# Patient Record
Sex: Female | Born: 1940 | Race: White | Hispanic: No | Smoking: Former smoker
Health system: Southern US, Community
[De-identification: ages and names within clinical notes are randomized; demographics above are authoritative.]

## PROBLEM LIST (undated history)

## (undated) DIAGNOSIS — I639 Cerebral infarction, unspecified: Secondary | ICD-10-CM

## (undated) DIAGNOSIS — B0229 Other postherpetic nervous system involvement: Secondary | ICD-10-CM

## (undated) DIAGNOSIS — M3500 Sicca syndrome, unspecified: Secondary | ICD-10-CM

## (undated) DIAGNOSIS — I8390 Asymptomatic varicose veins of unspecified lower extremity: Secondary | ICD-10-CM

## (undated) DIAGNOSIS — M1990 Unspecified osteoarthritis, unspecified site: Secondary | ICD-10-CM

## (undated) DIAGNOSIS — D649 Anemia, unspecified: Secondary | ICD-10-CM

## (undated) DIAGNOSIS — I10 Essential (primary) hypertension: Secondary | ICD-10-CM

## (undated) DIAGNOSIS — E785 Hyperlipidemia, unspecified: Secondary | ICD-10-CM

## (undated) DIAGNOSIS — I6529 Occlusion and stenosis of unspecified carotid artery: Secondary | ICD-10-CM

## (undated) DIAGNOSIS — F419 Anxiety disorder, unspecified: Secondary | ICD-10-CM

## (undated) HISTORY — DX: Sjogren syndrome, unspecified: M35.00

## (undated) HISTORY — DX: Hyperlipidemia, unspecified: E78.5

## (undated) HISTORY — PX: DILATION AND CURETTAGE OF UTERUS: SHX78

## (undated) HISTORY — DX: Occlusion and stenosis of unspecified carotid artery: I65.29

## (undated) HISTORY — DX: Cerebral infarction, unspecified: I63.9

## (undated) HISTORY — DX: Unspecified osteoarthritis, unspecified site: M19.90

## (undated) HISTORY — PX: EYE SURGERY: SHX253

## (undated) HISTORY — DX: Other postherpetic nervous system involvement: B02.29

## (undated) HISTORY — DX: Asymptomatic varicose veins of unspecified lower extremity: I83.90

## (undated) HISTORY — DX: Essential (primary) hypertension: I10

---

## 1966-02-23 HISTORY — PX: ADENOIDECTOMY: SUR15

## 1966-02-23 HISTORY — PX: TONSILLECTOMY: SUR1361

## 2006-02-23 DIAGNOSIS — I639 Cerebral infarction, unspecified: Secondary | ICD-10-CM

## 2006-02-23 HISTORY — DX: Cerebral infarction, unspecified: I63.9

## 2012-06-30 ENCOUNTER — Other Ambulatory Visit: Payer: Self-pay | Admitting: Family Medicine

## 2012-06-30 DIAGNOSIS — E785 Hyperlipidemia, unspecified: Secondary | ICD-10-CM

## 2012-06-30 DIAGNOSIS — R51 Headache: Secondary | ICD-10-CM

## 2012-06-30 DIAGNOSIS — Z8673 Personal history of transient ischemic attack (TIA), and cerebral infarction without residual deficits: Secondary | ICD-10-CM

## 2012-07-12 ENCOUNTER — Ambulatory Visit
Admission: RE | Admit: 2012-07-12 | Discharge: 2012-07-12 | Disposition: A | Discharge status: Home / Self Care | Payer: Medicare Other | Source: Ambulatory Visit | Attending: Family Medicine | Admitting: Family Medicine

## 2012-07-12 DIAGNOSIS — Z8673 Personal history of transient ischemic attack (TIA), and cerebral infarction without residual deficits: Secondary | ICD-10-CM

## 2012-07-12 DIAGNOSIS — R51 Headache: Secondary | ICD-10-CM

## 2012-07-12 DIAGNOSIS — E785 Hyperlipidemia, unspecified: Secondary | ICD-10-CM

## 2012-07-14 ENCOUNTER — Encounter: Payer: Self-pay | Admitting: Vascular Surgery

## 2012-08-10 ENCOUNTER — Encounter: Payer: Self-pay | Admitting: Vascular Surgery

## 2012-08-11 ENCOUNTER — Encounter: Payer: Self-pay | Admitting: Vascular Surgery

## 2012-08-11 ENCOUNTER — Ambulatory Visit (INDEPENDENT_AMBULATORY_CARE_PROVIDER_SITE_OTHER): Payer: Medicare Other | Admitting: Vascular Surgery

## 2012-08-11 DIAGNOSIS — I6529 Occlusion and stenosis of unspecified carotid artery: Secondary | ICD-10-CM

## 2012-08-11 NOTE — Progress Notes (Signed)
[facility] [place] HISTORY AND PHYSICAL   History of Present Illness:  Patient is a 72 y.o. 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  The patient previously had a stroke in 2008. This was in [facility]. Records are not available for review today. 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 She has recovered function in her right upper extremity except some numbness and tingling. She still has right eye visual loss. She also still has some weakness in her leg. She was told in the past that she could not have aspirin or Plavix due to her previous hemorrhagic stroke. She recently had a carotid duplex exam for surveillance. 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 I reviewed these images today. The area of suspected ulceration was 2 x 1.3 mm. There is less than 50% stenosis of the internal carotid arteries. Other medical problems include arthritis, hyperlipidemia, hypertension ulcers are currently controlled. The patient recently moved here from [place].  Past Medical History  Diagnosis Date  . Arthritis     osteoarthritis  . Hyperlipidemia   . Sjogren's syndrome   . Stroke     2008  . Hypertension     Past Surgical History  Procedure Laterality Date  . Dilation and curettage of uterus       Social History History  Substance Use Topics  . Smoking status: Former Smoker    Types: Cigarettes    Quit date: 08/11/2005  . Smokeless tobacco: Never Used  . Alcohol Use: 1 - 1.5 oz/week    2-3 drink(s) per week    Family History Family History  Problem Relation Age of Onset  . Other Mother     varicose veins  . Cancer Father   . Hypertension Father     Allergies  Allergies  Allergen Reactions  . Sulfa Antibiotics      Current Outpatient Prescriptions  Medication Sig  Dispense Refill  . amitriptyline (ELAVIL) 10 MG tablet Take 10 mg by mouth at bedtime.      . ciprofloxacin (CILOXAN) 0.3 % ophthalmic ointment 3 (three) times daily.      . cyclobenzaprine (FLEXERIL) 10 MG tablet Take 10 mg by mouth daily as needed for muscle spasms.      . diclofenac sodium (VOLTAREN) 1 % GEL Apply 2 g topically 4 (four) times daily.      Marland Kitchen ezetimibe (ZETIA) 10 MG tablet Take 10 mg by mouth daily.      . hydrochlorothiazide (MICROZIDE) 12.5 MG capsule Take 12.5 mg by mouth every morning.      . lidocaine (LIDODERM) 5 % Place 1 patch onto the skin every 12 (twelve) hours as needed. Remove & Discard patch within 12 hours or as directed by MD      . Oxycodone HCl 10 MG TABS Take 10 mg by mouth every 8 (eight) hours as needed.      . pregabalin (LYRICA) 75 MG capsule Take 75 mg by mouth 2 (two) times daily.      . ramipril (ALTACE) 5 MG tablet Take 5 mg by mouth daily. Take one po in the morning and one in the evening.      . rosuvastatin (CRESTOR) 40 MG tablet Take 40 mg by mouth daily.      . sertraline (ZOLOFT) 50 MG  tablet Take 50 mg by mouth daily.      . ciprofloxacin (CIPRO) 500 MG tablet Take 500 mg by mouth 2 (two) times daily.       No current facility-administered medications for this visit.    ROS:   General:  No weight loss, Fever, chills  HEENT: No recent headaches, no nasal bleeding, no visual changes, no sore throat  Neurologic: No dizziness, blackouts, seizures. No recent symptoms of stroke or mini- stroke. No recent episodes of slurred speech, or temporary blindness.  Cardiac: No recent episodes of chest pain/pressure, no shortness of breath at rest.  No shortness of breath with exertion.  Denies history of atrial fibrillation or irregular heartbeat  Vascular: No history of rest pain in feet.  No history of claudication.  No history of non-healing ulcer, No history of DVT   Pulmonary: No home oxygen, no productive cough, no hemoptysis,  No asthma or  wheezing  Musculoskeletal:  [x ] Arthritis, [ ]  Low back pain,  [ ]  Joint pain  Hematologic:No history of hypercoagulable state.  No history of easy bleeding.  No history of anemia  Gastrointestinal: No hematochezia or melena,  No gastroesophageal reflux, no trouble swallowing  Urinary: [ ]  chronic Kidney disease, [ ]  on HD - [ ]  MWF or [ ]  TTHS, [ ]  Burning with urination, [ ]  Frequent urination, [ ]  Difficulty urinating;   Skin: No rashes  Psychological: No history of anxiety,  No history of depression   Physical Examination  Filed Vitals:   08/11/12 1108 08/11/12 1113  BP: 135/80 119/75  Pulse: 77   Weight: 195 lb (88.451 kg)   SpO2: 96%     There is no height on file to calculate BMI.  General:  Alert and oriented, no acute distress HEENT: Normal Neck: No bruit or JVD Pulmonary: Clear to auscultation bilaterally Cardiac: Regular Rate and Rhythm without murmur Abdomen: Soft, non-tender, non-distended, no mass Skin: No rash Extremity Pulses:  2+ radial, brachial, femoral, dorsalis pedis, posterior tibial pulses bilaterally Musculoskeletal: No deformity or edema  Neurologic: Upper and lower extremity motor 5/5 and symmetric  DATA: Carotid duplex from [facility] imaging reviewed as resulted above   ASSESSMENT: Patient thought have a small ulceration left internal carotid artery however duplex ultrasound is not the best imaging modality to determine this. The ulceration at present is quite small less than 2 mm. The patient is asymptomatic.   PLAN:  I believe the best option would be carotid surveillance every 6 months. She'll return for carotid duplex exam in 6 months time. I think she would also benefit from either aspirin or Plavix for antiplatelet. However the patient had been told in the past due to her hemorrhagic infarct she did not take these medications. I did note however that she is on Votaren which has similar effects with regard to bleeding. All obtain her  records from [facility] and also try to get in touch with Dr. Vickey Huger from neurology regarding whether or not antiplatelet would be useful in this patient.  Fabienne Bruns, MD [facility] [place] Office: [phone] Pager: [phone]

## 2012-08-12 ENCOUNTER — Telehealth: Payer: Self-pay | Admitting: Vascular Surgery

## 2012-08-12 ENCOUNTER — Encounter: Payer: Self-pay | Admitting: Neurology

## 2012-08-12 NOTE — Telephone Encounter (Signed)
Spoke with Dr Vickey Huger who  Agrees with starting ASA.   Spoke with pt today.  She will start 81 mg ASA once daily.  Follow up with carotid duplex 6 months  Fabienne Bruns, MD [facility] [place] Office: [phone] Pager: [phone]

## 2012-09-14 ENCOUNTER — Encounter: Payer: Self-pay | Admitting: Podiatry

## 2012-10-21 ENCOUNTER — Telehealth: Payer: Self-pay

## 2012-10-21 NOTE — Telephone Encounter (Signed)
Pt. called to report bruising of her wrist recently after wearing a bracelet-style watch.  States the watch was a tight-fitting one.  Describes the bruising as "a semi-circle of tiny little bruises".  Pt. taking ASA 81 mg.  Denies any widespread bruising, or any unexplained bruising on body.  States the area on wrist has improved, and she will avoid wearing the watch in the future.  Wanted to make Dr. Darrick Penna aware, and questioned if he would want her to continue the ASA 81 mg daily vs decreasing to every other day.  Advised will discuss w/ Dr. Darrick Penna, and return call to her.  Agrees.

## 2012-10-25 NOTE — Telephone Encounter (Signed)
Rec'd recommendation per Dr. Darrick Penna to have pt. take ASA 81 mg every day.  Phone call to pt.  Left voice message with Dr. Darrick Penna recommendation to continue ASA daily.

## 2012-10-26 NOTE — Telephone Encounter (Signed)
Rec'd return phone call from pt. At 4:25 PM 10/25/12.  Stated she got the voice message re: continuing to take ASA 81 mg daily; stated she will continue it as prescribed.

## 2012-12-03 ENCOUNTER — Other Ambulatory Visit: Payer: Self-pay | Admitting: Neurology

## 2013-02-13 ENCOUNTER — Ambulatory Visit: Payer: Self-pay | Admitting: Neurology

## 2013-02-13 ENCOUNTER — Ambulatory Visit (INDEPENDENT_AMBULATORY_CARE_PROVIDER_SITE_OTHER): Payer: Medicare Other | Admitting: Podiatry

## 2013-02-13 ENCOUNTER — Encounter: Payer: Self-pay | Admitting: Podiatry

## 2013-02-13 VITALS — BP 154/85 | HR 60 | Resp 16 | Ht 63.0 in | Wt 180.0 lb

## 2013-02-13 DIAGNOSIS — M722 Plantar fascial fibromatosis: Secondary | ICD-10-CM

## 2013-02-13 MED ORDER — TRIAMCINOLONE ACETONIDE 10 MG/ML IJ SUSP
10.0000 mg | Freq: Once | INTRAMUSCULAR | Status: AC
  Administered 2013-02-13: 10 mg

## 2013-02-13 NOTE — Progress Notes (Signed)
Patient ID: Miranda Harrington, female   DOB: Apr 17, 1940, 72 y.o.   MRN: [id number]  Subjective: Patient presents complaining of painful left heel more so on the past week. She has had multiple Kenalog injections injections into the left heel on 12/25/2011, 10/17/2012, and 11/14/2012 all these injections have temporarily relieve the symptoms.  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  Objective: Palpable tenderness in the medial plantar fascial band without a palpable lesions. Fat-pad is adequate.  Assessment: Recurrence of plantar fasciitis left  Plan: At this time offered patient 1 additional injection of Kenalog. She verbally consents to this. The skin is prepped with alcohol and Betadine and 10 mg of Kenalog mixed with 10 mg of Xylocaine and 2.5 mg of Kenalog are injected inferior heel left for Kenalog injection #4. A fascial taping was applied to the left foot. Patient will leaves taping on for 5 days and then return to wearing her fasciitis strap on the left foot and stretch. Reappoint at patient's request.

## 2013-02-13 NOTE — Patient Instructions (Signed)
Plantar Fasciitis (Heel Spur Syndrome)  with Rehab  The plantar fascia is a fibrous, ligament-like, soft-tissue structure that spans the bottom of the foot. Plantar fasciitis is a condition that causes pain in the foot due to inflammation of the tissue.  SYMPTOMS   · Pain and tenderness on the underneath side of the foot.  · Pain that worsens with standing or walking.  CAUSES   Plantar fasciitis is caused by irritation and injury to the plantar fascia on the underneath side of the foot. Common mechanisms of injury include:  · Direct trauma to bottom of the foot.  · Damage to a small nerve that runs under the foot where the main fascia attaches to the heel bone.  · Stress placed on the plantar fascia due to bone spurs.  RISK INCREASES WITH:   · Activities that place stress on the plantar fascia (running, jumping, pivoting, or cutting).  · Poor strength and flexibility.  · Improperly fitted shoes.  · Tight calf muscles.  · Flat feet.  · Failure to warm-up properly before activity.  · Obesity.  PREVENTION  · Warm up and stretch properly before activity.  · Allow for adequate recovery between workouts.  · Maintain physical fitness:  · Strength, flexibility, and endurance.  · Cardiovascular fitness.  · Maintain a health body weight.  · Avoid stress on the plantar fascia.  · Wear properly fitted shoes, including arch supports for individuals who have flat feet.  PROGNOSIS   If treated properly, then the symptoms of plantar fasciitis usually resolve without surgery. However, occasionally surgery is necessary.  RELATED COMPLICATIONS   · Recurrent symptoms that may result in a chronic condition.  · Problems of the lower back that are caused by compensating for the injury, such as limping.  · Pain or weakness of the foot during push-off following surgery.  · Chronic inflammation, scarring, and partial or complete fascia tear, occurring more often from repeated injections.  TREATMENT   Treatment initially involves the use of  ice and medication to help reduce pain and inflammation. The use of strengthening and stretching exercises may help reduce pain with activity, especially stretches of the Achilles tendon. These exercises may be performed at home or with a therapist. Your caregiver may recommend that you use heel cups of arch supports to help reduce stress on the plantar fascia. Occasionally, corticosteroid injections are given to reduce inflammation. If symptoms persist for greater than 6 months despite non-surgical (conservative), then surgery may be recommended.   MEDICATION   · If pain medication is necessary, then nonsteroidal anti-inflammatory medications, such as aspirin and ibuprofen, or other minor pain relievers, such as acetaminophen, are often recommended.  · Do not take pain medication within 7 days before surgery.  · Prescription pain relievers may be given if deemed necessary by your caregiver. Use only as directed and only as much as you need.  · Corticosteroid injections may be given by your caregiver. These injections should be reserved for the most serious cases, because they may only be given a certain number of times.  HEAT AND COLD  · Cold treatment (icing) relieves pain and reduces inflammation. Cold treatment should be applied for 10 to 15 minutes every 2 to 3 hours for inflammation and pain and immediately after any activity that aggravates your symptoms. Use ice packs or massage the area with a piece of ice (ice massage).  · Heat treatment may be used prior to performing the stretching and strengthening activities prescribed   by your caregiver, physical therapist, or athletic trainer. Use a heat pack or soak the injury in warm water.  SEEK IMMEDIATE MEDICAL CARE IF:  · Treatment seems to offer no benefit, or the condition worsens.  · Any medications produce adverse side effects.  EXERCISES  RANGE OF MOTION (ROM) AND STRETCHING EXERCISES - Plantar Fasciitis (Heel Spur Syndrome)  These exercises may help you  when beginning to rehabilitate your injury. Your symptoms may resolve with or without further involvement from your physician, physical therapist or athletic trainer. While completing these exercises, remember:   · Restoring tissue flexibility helps normal motion to return to the joints. This allows healthier, less painful movement and activity.  · An effective stretch should be held for at least 30 seconds.  · A stretch should never be painful. You should only feel a gentle lengthening or release in the stretched tissue.  RANGE OF MOTION - Toe Extension, Flexion  · Sit with your right / left leg crossed over your opposite knee.  · Grasp your toes and gently pull them back toward the top of your foot. You should feel a stretch on the bottom of your toes and/or foot.  · Hold this stretch for __________ seconds.  · Now, gently pull your toes toward the bottom of your foot. You should feel a stretch on the top of your toes and or foot.  · Hold this stretch for __________ seconds.  Repeat __________ times. Complete this stretch __________ times per day.   RANGE OF MOTION - Ankle Dorsiflexion, Active Assisted  · Remove shoes and sit on a chair that is preferably not on a carpeted surface.  · Place right / left foot under knee. Extend your opposite leg for support.  · Keeping your heel down, slide your right / left foot back toward the chair until you feel a stretch at your ankle or calf. If you do not feel a stretch, slide your bottom forward to the edge of the chair, while still keeping your heel down.  · Hold this stretch for __________ seconds.  Repeat __________ times. Complete this stretch __________ times per day.   STRETCH  Gastroc, Standing  · Place hands on wall.  · Extend right / left leg, keeping the front knee somewhat bent.  · Slightly point your toes inward on your back foot.  · Keeping your right / left heel on the floor and your knee straight, shift your weight toward the wall, not allowing your back to  arch.  · You should feel a gentle stretch in the right / left calf. Hold this position for __________ seconds.  Repeat __________ times. Complete this stretch __________ times per day.  STRETCH  Soleus, Standing  · Place hands on wall.  · Extend right / left leg, keeping the other knee somewhat bent.  · Slightly point your toes inward on your back foot.  · Keep your right / left heel on the floor, bend your back knee, and slightly shift your weight over the back leg so that you feel a gentle stretch deep in your back calf.  · Hold this position for __________ seconds.  Repeat __________ times. Complete this stretch __________ times per day.  STRETCH  Gastrocsoleus, Standing   Note: This exercise can place a lot of stress on your foot and ankle. Please complete this exercise only if specifically instructed by your caregiver.   · Place the ball of your right / left foot on a step, keeping   your other foot firmly on the same step.  · Hold on to the wall or a rail for balance.  · Slowly lift your other foot, allowing your body weight to press your heel down over the edge of the step.  · You should feel a stretch in your right / left calf.  · Hold this position for __________ seconds.  · Repeat this exercise with a slight bend in your right / left knee.  Repeat __________ times. Complete this stretch __________ times per day.   STRENGTHENING EXERCISES - Plantar Fasciitis (Heel Spur Syndrome)   These exercises may help you when beginning to rehabilitate your injury. They may resolve your symptoms with or without further involvement from your physician, physical therapist or athletic trainer. While completing these exercises, remember:   · Muscles can gain both the endurance and the strength needed for everyday activities through controlled exercises.  · Complete these exercises as instructed by your physician, physical therapist or athletic trainer. Progress the resistance and repetitions only as guided.  STRENGTH - Towel  Curls  · Sit in a chair positioned on a non-carpeted surface.  · Place your foot on a towel, keeping your heel on the floor.  · Pull the towel toward your heel by only curling your toes. Keep your heel on the floor.  · If instructed by your physician, physical therapist or athletic trainer, add ____________________ at the end of the towel.  Repeat __________ times. Complete this exercise __________ times per day.  STRENGTH - Ankle Inversion  · Secure one end of a rubber exercise band/tubing to a fixed object (table, pole). Loop the other end around your foot just before your toes.  · Place your fists between your knees. This will focus your strengthening at your ankle.  · Slowly, pull your big toe up and in, making sure the band/tubing is positioned to resist the entire motion.  · Hold this position for __________ seconds.  · Have your muscles resist the band/tubing as it slowly pulls your foot back to the starting position.  Repeat __________ times. Complete this exercises __________ times per day.   Document Released: 02/09/2005 Document Revised: 05/04/2011 Document Reviewed: 05/24/2008  [employer]® Patient Information ©2014 [employer].

## 2013-02-22 ENCOUNTER — Ambulatory Visit: Payer: Self-pay | Admitting: Neurology

## 2013-03-02 ENCOUNTER — Other Ambulatory Visit (HOSPITAL_COMMUNITY): Payer: Medicare Other

## 2013-03-06 ENCOUNTER — Encounter: Payer: Self-pay | Admitting: Neurology

## 2013-03-16 ENCOUNTER — Ambulatory Visit (HOSPITAL_COMMUNITY)
Admission: RE | Admit: 2013-03-16 | Discharge: 2013-03-16 | Disposition: A | Discharge status: Home / Self Care | Payer: Medicare Other | Source: Ambulatory Visit | Attending: Vascular Surgery | Admitting: Vascular Surgery

## 2013-03-16 DIAGNOSIS — I658 Occlusion and stenosis of other precerebral arteries: Secondary | ICD-10-CM | POA: Insufficient documentation

## 2013-03-16 DIAGNOSIS — I6529 Occlusion and stenosis of unspecified carotid artery: Secondary | ICD-10-CM

## 2013-03-17 ENCOUNTER — Encounter: Payer: Self-pay | Admitting: Neurology

## 2013-03-17 ENCOUNTER — Ambulatory Visit (INDEPENDENT_AMBULATORY_CARE_PROVIDER_SITE_OTHER): Payer: Medicare Other | Admitting: Neurology

## 2013-03-17 VITALS — BP 105/75 | HR 79 | Resp 16 | Ht 61.5 in | Wt 189.0 lb

## 2013-03-17 DIAGNOSIS — Z8673 Personal history of transient ischemic attack (TIA), and cerebral infarction without residual deficits: Secondary | ICD-10-CM

## 2013-03-17 DIAGNOSIS — I69369 Other paralytic syndrome following cerebral infarction affecting unspecified side: Secondary | ICD-10-CM

## 2013-03-17 DIAGNOSIS — I69969 Other paralytic syndrome following unspecified cerebrovascular disease affecting unspecified side: Secondary | ICD-10-CM

## 2013-03-17 DIAGNOSIS — I69359 Hemiplegia and hemiparesis following cerebral infarction affecting unspecified side: Secondary | ICD-10-CM | POA: Insufficient documentation

## 2013-03-17 MED ORDER — SERTRALINE HCL 50 MG PO TABS: 50.0000 mg | ORAL_TABLET | Freq: Every day | ORAL | Status: DC

## 2013-03-17 MED ORDER — DICLOFENAC SODIUM 1 % TD GEL: 2.0000 g | Freq: Four times a day (QID) | TRANSDERMAL | Status: DC

## 2013-03-17 NOTE — Progress Notes (Signed)
[facility]  Provider:  Melvyn Novas, M D  Referring Provider: Cain Saupe, MD Primary Care Physician:  Cain Saupe, MD  Chief Complaint  Patient presents with  . Follow-up    Room 10  . neurpathy/ pain    HPI:  Miranda Harrington is a 73 y.o. female  Is seen here as a referral/ revisit  from Dr. 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  The patient remembers that on 03/07/2006 she was snowboarding in [place] when she suffered her stroke. She is originally from [place] but spent the winters in [place]. She was doing laundry when she noticed an abnormal strange deep brain pain and pressure that seemed to have pierced  the right eye.  She was for while stunned by this but she did not feel that she passed out or lost awareness.  She was actually able to talk to her partner than a combination back to the apartment where she could day.on the bed and rest. Over the next hour or so the symptoms progressed she was unable to walk or stand again. This all happened around 5 Pm and her neighbor and friend called EMS services. She remembers that she was not able to identify her neighbor was a good friend. She can remember the sirens of the ambulance arriving at her apartment and then passed out. She was in the intensive care unit at the [facility]. After 2 or 3 days in ICU she was discharged to a regular floor and finally to a rehabilitation.  She developed intense pain in the affected right body her arms seem to burn or sting, the same for related to a lesser degree. She did not  have facial pain however. She remains numb in the right foot result proprioception and she states that she has not been able to drive she's not able to is asserted that any pedal power. She lost vision ( bilaterally)  to the right side.  [name] is followed by Dr.Fulp but and after a brief on conference we agreed that she is  safe to continue on a baby aspirin daily , needed for her risk factors and health factors. The patient takes Flexeril and Lyrica for the control of her post stroke pain and dysesthesias.    Review of Systems: Out of a complete 14 system review, the patient complains of only the following symptoms, and all other reviewed systems are negative. The first, numbness, aching muscles whopping difficulties with right-sided stiffness and weakness pain and depression daytime sleepiness. No folds in the last 12 months, nor interval history with hospitalizations or surgeries and no changes in family medical history. Her fingertips ar bleeding and are scaling, extremely dry.Skin Cracks.   History   Social History  . Marital Status: Divorced    Spouse Name: N/A    Number of Children: 3  . Years of Education: college   Occupational History  . Not on file.   Social History Main Topics  . Smoking status: Former Smoker    Types: Cigarettes    Quit date: 08/11/2005  . Smokeless tobacco: Never Used  . Alcohol Use: 1.2 - 1.8 oz/week    2-3 Glasses of wine per week     Comment: occasionally  . Drug Use: No  . Sexual Activity: Not on file   Other Topics Concern  . Not on file   Social History Narrative   Patient  is divorced.   Patient has three children.   Patient does not drink any caffeine.   Patient is right-handed.   Patient has a college education.    Family History  Problem Relation Age of Onset  . Other Mother     varicose veins  . Cancer Father   . Hypertension Father   . Lymphoma Maternal Aunt   . Multiple sclerosis Sister   . Rheum arthritis Mother   . Leukemia Grandchild     Past Medical History  Diagnosis Date  . Arthritis     osteoarthritis  . Hyperlipidemia   . Sjogren's syndrome   . Stroke     2008  . Hypertension     Past Surgical History  Procedure Laterality Date  . Dilation and curettage of uterus    . Adenoidectomy  1968  . Tonsillectomy  1968     Current Outpatient Prescriptions  Medication Sig Dispense Refill  . amitriptyline (ELAVIL) 10 MG tablet Take 10 mg by mouth at bedtime.      Marland Kitchen BABY ASPIRIN PO Take 81 mg by mouth daily.      . ciprofloxacin (CILOXAN) 0.3 % ophthalmic ointment 3 (three) times daily.      . cyclobenzaprine (FLEXERIL) 10 MG tablet TAKE ONE TABLET BY MOUTH THREE TIMES DAILY  90 tablet  1  . diclofenac sodium (VOLTAREN) 1 % GEL Apply 2 g topically 4 (four) times daily.      Marland Kitchen ezetimibe (ZETIA) 10 MG tablet Take 10 mg by mouth daily.      . hydrochlorothiazide (MICROZIDE) 12.5 MG capsule Take 12.5 mg by mouth every morning.      . lidocaine (LIDODERM) 5 % Place 1 patch onto the skin every 12 (twelve) hours as needed. Remove & Discard patch within 12 hours or as directed by MD      . Oxycodone HCl 10 MG TABS Take 10 mg by mouth every 8 (eight) hours as needed.      . pregabalin (LYRICA) 75 MG capsule Take 75 mg by mouth 2 (two) times daily.      . ramipril (ALTACE) 5 MG tablet Take 5 mg by mouth daily. Take one po in the morning and one in the evening.      . rosuvastatin (CRESTOR) 40 MG tablet Take 40 mg by mouth daily.      . sertraline (ZOLOFT) 50 MG tablet Take 50 mg by mouth daily.       No current facility-administered medications for this visit.    Allergies as of 03/17/2013 - Review Complete 03/17/2013  Allergen Reaction Noted  . Sulfa antibiotics  07/14/2012    Vitals: BP 105/75  Pulse 79  Resp 16  Ht 5' 1.5" (1.562 m)  Wt 189 lb (85.73 kg)  BMI 35.14 kg/m2 Last Weight:  Wt Readings from Last 1 Encounters:  03/17/13 189 lb (85.73 kg)   Last Height:   Ht Readings from Last 1 Encounters:  03/17/13 5' 1.5" (1.562 m)    Physical exam:  General: The patient is awake, alert and appears not in acute distress. The patient is well groomed. Head: Normocephalic, atraumatic. Neck is supple. Mallampati 3 , neck circumference: 16 Cardiovascular:  Regular rate and rhythm, without  murmurs or  carotid bruit, and without distended neck veins. Respiratory: Lungs are clear to auscultation. Skin:  Without evidence of edema, or rash, she has cracking of the skin of all 10 finger tips. ( Sjoegren's ) Trunk: BMI is  elevated  and patient  has a slightly right leaning posture, a lower right shoulder.  Neurologic exam : The patient is awake and alert, oriented to place and time.  Memory subjective  described as intact. There is a normal attention span & concentration ability.  Speech is fluent without  dysarthria, dysphonia or aphasia. Remarkable for left sided stroke.  Mood and affect are appropriate.  Cranial nerves: Pupils are equal and briskly reactive to light. Funduscopic exam without  evidence of pallor or edema. Extraocular movements  in vertical and horizontal planes intact and without nystagmus. Visual fields by finger perimetry are abnormal-. Lower quadrant vision loss,  Restricted to the right side . Hearing to finger rub intact.  Facial sensation intact to fine touch. Facial motor strength is symmetric and tongue and uvula move midline.  Motor exam:   The patient has bilateral very good equal grip strength she has a slight deficit of coordination, supination and 8 just mildly elevated torn over the right biceps triceps and at the shoulder. She has a equal muscle bulk left and right.  Sensory:  Fine touch, pinprick and vibration were tested in all extremities. She feels a gloved, covered layer on the right.  Proprioception is impaired on the right side.  Coordination: Rapid alternating movements in the fingers/hands is tested and normal.  Finger-to-nose maneuver tested and normal without evidence of ataxia, dysmetria or tremor.  Gait and station: Patient walks without assistive device and is able unassisted  To climb up to the stool , climb up to the exam table.  Strength within normal limits. Stance is stable and normal. Tandem gait is fragmented. Romberg testing is positive for  lateral right side.  Deep tendon reflexes: in the  upper and lower extremities are symmetric and intact. Babinski maneuver response is upgoing on the right,  downgoing left.   Assessment:  After physical and neurologic examination, review of laboratory studies, imaging, neurophysiology testing and pre-existing records, assessment is   When I saw Mrs. Klinge in 2013 she was walking with the 3 prong cane, she didn't need  indoors anymore. She has made a markable recovery from her stroke, is able to live  independent.  Plan:  Treatment plan and additional workup :

## 2013-03-21 ENCOUNTER — Telehealth: Payer: Self-pay

## 2013-03-21 NOTE — Telephone Encounter (Signed)
[facility] sent us a fax saying Voltaren Gel is not formulary on the patients current ins plan.  They would like to know if the Rx could be changed to Voltaren tabs instead.  Please advise.  Thank you.

## 2013-03-22 ENCOUNTER — Encounter: Payer: Self-pay | Admitting: Vascular Surgery

## 2013-03-22 ENCOUNTER — Other Ambulatory Visit: Payer: Self-pay | Admitting: *Deleted

## 2013-03-22 DIAGNOSIS — I6529 Occlusion and stenosis of unspecified carotid artery: Secondary | ICD-10-CM

## 2013-03-22 NOTE — Telephone Encounter (Signed)
Patient was called and sample was left at the front desk.

## 2013-03-22 NOTE — Telephone Encounter (Signed)
I rather have her pick up a sample, we have samples of voltaren gel or cream in the pill pantry right now. Should I let Lynden AngCathy know?

## 2013-03-22 NOTE — Telephone Encounter (Signed)
Called patient and left message that I was leave a sample of Voltaren Gel at the front desk. I advised the patient that if she has any other problems, questions or concerns to call the office.

## 2013-04-05 ENCOUNTER — Other Ambulatory Visit: Payer: Self-pay

## 2013-04-05 MED ORDER — CYCLOBENZAPRINE HCL 10 MG PO TABS: 10.0000 mg | ORAL_TABLET | Freq: Three times a day (TID) | ORAL | Status: DC

## 2013-04-10 ENCOUNTER — Encounter: Payer: Self-pay | Admitting: Podiatry

## 2013-04-10 ENCOUNTER — Ambulatory Visit (INDEPENDENT_AMBULATORY_CARE_PROVIDER_SITE_OTHER): Payer: Medicare Other | Admitting: Podiatry

## 2013-04-10 VITALS — BP 95/56 | HR 76 | Resp 18

## 2013-04-10 DIAGNOSIS — I6529 Occlusion and stenosis of unspecified carotid artery: Secondary | ICD-10-CM

## 2013-04-10 DIAGNOSIS — L03039 Cellulitis of unspecified toe: Secondary | ICD-10-CM

## 2013-04-10 MED ORDER — CEPHALEXIN 500 MG PO CAPS: 500.0000 mg | ORAL_CAPSULE | Freq: Four times a day (QID) | ORAL | Status: DC

## 2013-04-10 NOTE — Progress Notes (Signed)
   Subjective:    Patient ID: Miranda Harrington, female    DOB: Sep 27, 1940, 73 y.o.   MRN: [id number]  HPI my left big toenail is not draining and been hurting and Dr Leeanne Deeduchman trimmed on it back in the fall and now hurts in that same place and I do get pedicures and told to cut it short but they dont and it is thick. This patient is complaining of increasing discomfort localized around the left hallux nail for approximately 6 weeks    Review of Systems     Objective:   Physical Exam  Orientated x3 white female  Dermatological: Low-grade erythema along the medial lateral posterior left hallux nail. No drainage noted  Musculoskeletal prominent right first MPJ dorsally noted.        Assessment & Plan:  Assessment: Paronychia left hallux  Plan: Cephalexin 500 mg by mouth 4 times a day x7 days prescribed. Patient return to symptoms do not respond oral antibiotics.

## 2013-04-10 NOTE — Patient Instructions (Signed)
Paronychia Paronychia is an inflammatory reaction involving the folds of the skin surrounding the fingernail. This is commonly caused by an infection in the skin around a nail. The most common cause of paronychia is frequent wetting of the hands (as seen with bartenders, food servers, nurses or others who wet their hands). This makes the skin around the fingernail susceptible to infection by bacteria (germs) or fungus. Other predisposing factors are:  Aggressive manicuring.  Nail biting.  Thumb sucking. The most common cause is a staphylococcal (a type of germ) infection, or a fungal (Candida) infection. When caused by a germ, it usually comes on suddenly with redness, swelling, pus and is often painful. It may get under the nail and form an abscess (collection of pus), or form an abscess around the nail. If the nail itself is infected with a fungus, the treatment is usually prolonged and may require oral medicine for up to one year. Your caregiver will determine the length of time treatment is required. The paronychia caused by bacteria (germs) may largely be avoided by not pulling on hangnails or picking at cuticles. When the infection occurs at the tips of the finger it is called felon. When the cause of paronychia is from the herpes simplex virus (HSV) it is called herpetic whitlow. TREATMENT  When an abscess is present treatment is often incision and drainage. This means that the abscess must be cut open so the pus can get out. When this is done, the following home care instructions should be followed. HOME CARE INSTRUCTIONS   It is important to keep the affected fingers very dry. Rubber or plastic gloves over cotton gloves should be used whenever the hand must be placed in water.  Keep wound clean, dry and dressed as suggested by your caregiver between warm soaks or warm compresses.  Soak in warm water for fifteen to twenty minutes three to four times per day for bacterial infections. Fungal  infections are very difficult to treat, so often require treatment for long periods of time.  For bacterial (germ) infections take antibiotics (medicine which kill germs) as directed and finish the prescription, even if the problem appears to be solved before the medicine is gone.  Only take over-the-counter or prescription medicines for pain, discomfort, or fever as directed by your caregiver. SEEK IMMEDIATE MEDICAL CARE IF:  You have redness, swelling, or increasing pain in the wound.  You notice pus coming from the wound.  You have a fever.  You notice a bad smell coming from the wound or dressing. Document Released: 08/05/2000 Document Revised: 05/04/2011 Document Reviewed: 04/06/2008 [employer] Patient Information 2014 [employer].  

## 2013-05-03 ENCOUNTER — Telehealth: Payer: Self-pay | Admitting: Neurology

## 2013-05-03 NOTE — Telephone Encounter (Signed)
I called back.  Spoke with Asher MuirJamie.  They have denied our request for coverage on Volatren Gel because the patient does not have a diagnosis of osteoarthiritis, which is specifically listed in her benefit plan.  We have already faxed additional clinical info to ins.  As well they faxed us a form to complete today, which I have, and faxed back to appeals at [phone].  Case # [id number].  Decision is pending at this time.

## 2013-05-03 NOTE — Telephone Encounter (Signed)
Pt called [facility] the # is [phone] and pt states whoever answers they need to give the authorization #[id number] along with other medical information to the insurance. This is pertaining to her prescription diclofenac sodium (VOLTAREN) 1 % GEL. Pt states if you have any ?'s you can contact her concerning this matter.

## 2013-06-09 ENCOUNTER — Other Ambulatory Visit: Payer: Self-pay | Admitting: Family Medicine

## 2013-06-09 DIAGNOSIS — E2839 Other primary ovarian failure: Secondary | ICD-10-CM

## 2013-06-09 DIAGNOSIS — Z1231 Encounter for screening mammogram for malignant neoplasm of breast: Secondary | ICD-10-CM

## 2013-06-16 DIAGNOSIS — F1990 Other psychoactive substance use, unspecified, uncomplicated: Secondary | ICD-10-CM | POA: Insufficient documentation

## 2013-06-22 ENCOUNTER — Ambulatory Visit
Admission: RE | Admit: 2013-06-22 | Discharge: 2013-06-22 | Disposition: A | Discharge status: Home / Self Care | Payer: Medicare Other | Source: Ambulatory Visit | Attending: Family Medicine | Admitting: Family Medicine

## 2013-06-22 ENCOUNTER — Encounter (INDEPENDENT_AMBULATORY_CARE_PROVIDER_SITE_OTHER): Payer: Self-pay

## 2013-06-22 DIAGNOSIS — Z1231 Encounter for screening mammogram for malignant neoplasm of breast: Secondary | ICD-10-CM

## 2013-06-22 DIAGNOSIS — E2839 Other primary ovarian failure: Secondary | ICD-10-CM

## 2013-06-23 ENCOUNTER — Other Ambulatory Visit: Payer: Self-pay | Admitting: *Deleted

## 2013-06-23 DIAGNOSIS — I83893 Varicose veins of bilateral lower extremities with other complications: Secondary | ICD-10-CM

## 2013-08-02 ENCOUNTER — Encounter: Payer: Self-pay | Admitting: Vascular Surgery

## 2013-08-03 ENCOUNTER — Ambulatory Visit (INDEPENDENT_AMBULATORY_CARE_PROVIDER_SITE_OTHER): Payer: Medicare Other | Admitting: Vascular Surgery

## 2013-08-03 ENCOUNTER — Ambulatory Visit (HOSPITAL_COMMUNITY)
Admission: RE | Admit: 2013-08-03 | Discharge: 2013-08-03 | Disposition: A | Discharge status: Home / Self Care | Payer: Medicare Other | Source: Ambulatory Visit | Attending: Vascular Surgery | Admitting: Vascular Surgery

## 2013-08-03 ENCOUNTER — Encounter: Payer: Self-pay | Admitting: Vascular Surgery

## 2013-08-03 VITALS — BP 138/64 | HR 72 | Resp 16 | Ht 62.0 in | Wt 189.0 lb

## 2013-08-03 DIAGNOSIS — I83893 Varicose veins of bilateral lower extremities with other complications: Secondary | ICD-10-CM | POA: Insufficient documentation

## 2013-08-03 NOTE — Progress Notes (Signed)
[facility] [place] HISTORY AND PHYSICAL   History of Present Illness:  Patient is a 73 y.o. year old female who presents for evaluation of right ankle swelling and varicose veins. 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 The patient stated that she developed right ankle swelling after a recent trip to [place]. The ankle swelling has now resolved. She was concerned whether or not varicose veins may be contributing to the ankle swelling. She denies any trauma to the ankle. She denies prior history of DVT.  She does not really complain of pain in her lower extremities. She does get swelling in her lower extremities if she stays on her feet all day. She gets relief with compression stockings. She has been wearing these for several months and has been followed at the pain Center for this. She has had no prior surgical procedures on her veins. Other medical problems include prior stroke in 2008, hypertension, hyperlipidemia, multi- joint arthritis.  Past Medical History  Diagnosis Date  . Arthritis     osteoarthritis  . Hyperlipidemia   . Sjogren's syndrome   . Stroke     2008  . Hypertension     Past Surgical History  Procedure Laterality Date  . Dilation and curettage of uterus    . Adenoidectomy  1968  . Tonsillectomy  1968    Social History History  Substance Use Topics  . Smoking status: Former Smoker    Types: Cigarettes    Quit date: 08/11/2005  . Smokeless tobacco: Never Used  . Alcohol Use: 1.2 - 1.8 oz/week    2-3 Glasses of wine per week     Comment: occasionally    Family History Family History  Problem Relation Age of Onset  . Other Mother     varicose veins  . Cancer Father   . Hypertension Father   . Lymphoma Maternal Aunt   . Multiple sclerosis Sister   . Rheum arthritis Mother   . Leukemia Grandchild     Allergies  Allergies  Allergen Reactions  . Sulfa Antibiotics       Current Outpatient Prescriptions  Medication Sig Dispense Refill  . amitriptyline (ELAVIL) 10 MG tablet Take 10 mg by mouth at bedtime.      Marland Kitchen. BABY ASPIRIN PO Take 81 mg by mouth daily.      . cyclobenzaprine (FLEXERIL) 10 MG tablet Take 1 tablet (10 mg total) by mouth 3 (three) times daily.  90 tablet  6  . diclofenac sodium (VOLTAREN) 1 % GEL Apply 2 g topically 4 (four) times daily.  100 g  5  . ezetimibe (ZETIA) 10 MG tablet Take 10 mg by mouth daily.      . hydrochlorothiazide (MICROZIDE) 12.5 MG capsule Take 12.5 mg by mouth every morning.      . lidocaine (LIDODERM) 5 % Place 1 patch onto the skin every 12 (twelve) hours as needed. Remove & Discard patch within 12 hours or as directed by MD      . Oxycodone HCl 10 MG TABS Take 10 mg by mouth every 8 (eight) hours as needed.      Marland Kitchen. oxyCODONE-acetaminophen (PERCOCET) 10-325 MG per tablet       . pregabalin (LYRICA) 75 MG capsule Take 75 mg by mouth 2 (two) times daily.      . ramipril (ALTACE) 5 MG tablet Take 5 mg by mouth daily. Take one po in the morning  and one in the evening.      . rosuvastatin (CRESTOR) 40 MG tablet Take 40 mg by mouth daily.      . sertraline (ZOLOFT) 50 MG tablet Take 1 tablet (50 mg total) by mouth daily.  90 tablet  3  . cephALEXin (KEFLEX) 500 MG capsule Take 1 capsule (500 mg total) by mouth 4 (four) times daily.  28 capsule  0  . ciprofloxacin (CILOXAN) 0.3 % ophthalmic ointment 3 (three) times daily.       No current facility-administered medications for this visit.    ROS:   General:  No weight loss, Fever, chills  HEENT: No recent headaches, no nasal bleeding, no visual changes, no sore throat  Neurologic: No dizziness, blackouts, seizures. No recent symptoms of stroke or mini- stroke. No recent episodes of slurred speech, or temporary blindness.  Cardiac: No recent episodes of chest pain/pressure, no shortness of breath at rest.  No shortness of breath with exertion.  Denies history of  atrial fibrillation or irregular heartbeat  Vascular: No history of rest pain in feet.  No history of claudication.  No history of non-healing ulcer, No history of DVT   Pulmonary: No home oxygen, no productive cough, no hemoptysis,  No asthma or wheezing  Musculoskeletal:  [x ] Arthritis, [ ]  Low back pain,  [x ] Joint pain  Hematologic:No history of hypercoagulable state.  No history of easy bleeding.  No history of anemia  Gastrointestinal: No hematochezia or melena,  No gastroesophageal reflux, no trouble swallowing  Urinary: [ ]  chronic Kidney disease, [ ]  on HD - [ ]  MWF or [ ]  TTHS, [ ]  Burning with urination, [ ]  Frequent urination, [ ]  Difficulty urinating;   Skin: No rashes  Psychological: No history of anxiety,  No history of depression   Physical Examination  Filed Vitals:   08/03/13 1449  BP: 138/64  Pulse: 72  Resp: 16  Height: 5\' 2"  (1.575 m)  Weight: 189 lb (85.73 kg)    Body mass index is 34.56 kg/(m^2).  General:  Alert and oriented, no acute distress HEENT: Normal Neck: No bruit or JVD Pulmonary: Clear to auscultation bilaterally Cardiac: Regular Rate and Rhythm without murmur Abdomen: Soft, non-tender, non-distended, no mass, no scars Skin: No rash, small spider-type varicosities diffusely below the knee bilaterally clusters of varicosities on the posterior calf right worse than left approximately 5 mm in diameter, no ulceration Extremity Pulses:  2+ radial, brachial, femoral, dorsalis pedis pulses bilaterally Musculoskeletal: No deformity or edema  Neurologic: Upper and lower extremity motor 5/5 and symmetric  DATA:  Patient had bilateral venous reflux exam today. She did have evidence of superficial reflux in the greater saphenous vein at the saphenofemoral junction on the left with a 6 mm diameter and in the greater saphenous vein on the right with a 4-6 mm diameter. She also had some reflux in the left lesser saphenous vein which was less than 4 mm  in diameter. She also had evidence of some deep vein reflux. I reviewed and interpreted this study.   ASSESSMENT:  Mildly symptomatic bilateral varicose veins. The patient is not interested currently in an intervention for her varicose veins. She currently is happy that her symptoms are improved with compression stockings and leg elevation alone. I reassured her that this does not put her at risk for limb loss or DVT or pulmonary embolus. She will continue wearing her compression stockings. She has followup with [place] in a few months for  repeat carotid duplex exam.   PLAN:  See above  Fabienne Bruns, MD [facility] [place] Office: [phone] Pager: [phone]

## 2013-08-07 ENCOUNTER — Ambulatory Visit: Payer: Medicare Other | Admitting: Podiatry

## 2014-01-03 NOTE — Telephone Encounter (Signed)
Patient never came to pick up Sample of Voltaren Gel.

## 2014-02-06 ENCOUNTER — Other Ambulatory Visit: Payer: Self-pay | Admitting: Neurology

## 2014-02-14 ENCOUNTER — Encounter: Payer: Self-pay | Admitting: Podiatry

## 2014-02-14 ENCOUNTER — Ambulatory Visit (INDEPENDENT_AMBULATORY_CARE_PROVIDER_SITE_OTHER): Payer: Medicare Other | Admitting: Podiatry

## 2014-02-14 DIAGNOSIS — M722 Plantar fascial fibromatosis: Secondary | ICD-10-CM

## 2014-02-14 MED ORDER — TRIAMCINOLONE ACETONIDE 10 MG/ML IJ SUSP
10.0000 mg | Freq: Once | INTRAMUSCULAR | Status: AC
  Administered 2014-02-14: 10 mg

## 2014-02-14 NOTE — Patient Instructions (Signed)
Bent - Knee Calf Stretch  1) Stand an arm's length away from a wall. Place the palms of your hands on the wall. Step forward about 12 inches with the opposite foot.  2) Keeping toes pointed forward and both heels on the floor, bend both knees and lean forward. Hold this position for 60 seconds. Don't arch your back and don't hunch your shoulders.  3) Repeat this twice.  DO THIS STRETCHING TECHNIQUE 3 TIMES A DAY.   Stretching Exercises before Standing      Pull your toes up toward your nose and hold for 1 minute before standing.  A towel can assist with this exercise if you put the towel under the ball of your foot. This exercise reduces the intense    pain associated when changing from a seated to a standing position. This stretch can usually be the most beneficial if done before getting out of bed in the mornings. Plantar Fasciitis Plantar fasciitis is a common condition that causes foot pain. It is soreness (inflammation) of the band of tough fibrous tissue on the bottom of the foot that runs from the heel bone (calcaneus) to the ball of the foot. The cause of this soreness may be from excessive standing, poor fitting shoes, running on hard surfaces, being overweight, having an abnormal walk, or overuse (this is common in runners) of the painful foot or feet. It is also common in aerobic exercise dancers and ballet dancers. SYMPTOMS  Most people with plantar fasciitis complain of:  Severe pain in the morning on the bottom of their foot especially when taking the first steps out of bed. This pain recedes after a few minutes of walking.  Severe pain is experienced also during walking following a long period of inactivity.  Pain is worse when walking barefoot or up stairs DIAGNOSIS   Your caregiver will diagnose this condition by examining and feeling your foot.  Special tests such as X-rays of your foot, are usually not needed. PREVENTION   Consult a sports medicine professional  before beginning a new exercise program.  Walking programs offer a good workout. With walking there is a lower chance of overuse injuries common to runners. There is less impact and less jarring of the joints.  Begin all new exercise programs slowly. If problems or pain develop, decrease the amount of time or distance until you are at a comfortable level.  Wear good shoes and replace them regularly.  Stretch your foot and the heel cords at the back of the ankle (Achilles tendon) both before and after exercise.  Run or exercise on even surfaces that are not hard. For example, asphalt is better than pavement.  Do not run barefoot on hard surfaces.  If using a treadmill, vary the incline.  Do not continue to workout if you have foot or joint problems. Seek professional help if they do not improve. HOME CARE INSTRUCTIONS   Avoid activities that cause you pain until you recover.  Use ice or cold packs on the problem or painful areas after working out.  Only take over-the-counter or prescription medicines for pain, discomfort, or fever as directed by your caregiver.  Soft shoe inserts or athletic shoes with air or gel sole cushions may be helpful.  If problems continue or become more severe, consult a sports medicine caregiver or your own health care provider. Cortisone is a potent anti-inflammatory medication that may be injected into the painful area. You can discuss this treatment with your caregiver. MAKE   SURE YOU:   Understand these instructions.  Will watch your condition.  Will get help right away if you are not doing well or get worse. Document Released: 11/04/2000 Document Revised: 05/04/2011 Document Reviewed: 01/04/2008 Exit[employer] Patient Information 2015 [employer]. This information is not intended to replace advice given to you by your health care provider. Make sure you discuss any questions you have with your health care provider.  

## 2014-02-14 NOTE — Progress Notes (Signed)
   Subjective:    Patient ID: Miranda Harrington, female    DOB: 1940/08/24, 73 y.o.   MRN: [id number]  HPI  LT FOOT BOTTOM OF THE HEEL START BOTHERING ME AGAIN .  N-SORE L-LT BOTTOM OF THE HEEL D-1 WEEK O-SLOWLY C-WORSE A-PRESSURE T-ICING  Patient presents with approximately two-week history of the inferior left heel pain. She has a history of left plantar fasciitis and had a series of for Kenalog injections into the inferior left heel. The last visit for plantar fasciitis was 02/13/2013.  Review of Systems  All other systems reviewed and are negative.      Objective:   Physical Exam  Orientated 3  Vascular: DP and PT pulses 2/4 bilaterally Capillary reflex immediate bilaterally  Neurological: Deferred  Musculoskeletal Palpable tenderness medial plantar fascial band left which duplicates patient's discomfort. There are no palpable lesions in the fat pad is adequate.       Assessment & Plan:   Assessment: Plantar fasciitis left  Plan: Patient has history of vascular disease and I will defer and NSAIDs. I offered a Kenalog Injection and she verbally consents  Skin is prepped with alcohol and Betadine and 10 mg of plain Xylocaine mixed with 2.5 mg of plain Marcaine and 10 mg of Kenalog were injected inferior heel left for Kenalog injection #1 in this series (previous series of 4 injections last injection December 2014) Patient outer procedure without any difficulty  Plantar fascial taping apply the left foot  Shoeing and stretching discussed  Reappoint at patient's request

## 2014-02-19 ENCOUNTER — Other Ambulatory Visit: Payer: Self-pay | Admitting: Neurology

## 2014-03-20 ENCOUNTER — Ambulatory Visit: Payer: Medicare Other | Admitting: Neurology

## 2014-03-21 ENCOUNTER — Encounter: Payer: Self-pay | Admitting: Family

## 2014-03-22 ENCOUNTER — Encounter: Payer: Self-pay | Admitting: Family

## 2014-03-22 ENCOUNTER — Ambulatory Visit (HOSPITAL_COMMUNITY)
Admission: RE | Admit: 2014-03-22 | Discharge: 2014-03-22 | Disposition: A | Discharge status: Home / Self Care | Payer: Medicare Other | Source: Ambulatory Visit | Attending: Family | Admitting: Family

## 2014-03-22 ENCOUNTER — Ambulatory Visit (INDEPENDENT_AMBULATORY_CARE_PROVIDER_SITE_OTHER): Payer: Medicare Other | Admitting: Family

## 2014-03-22 VITALS — BP 151/85 | HR 55 | Resp 16 | Ht 62.5 in | Wt 184.0 lb

## 2014-03-22 DIAGNOSIS — I69359 Hemiplegia and hemiparesis following cerebral infarction affecting unspecified side: Secondary | ICD-10-CM

## 2014-03-22 DIAGNOSIS — R202 Paresthesia of skin: Secondary | ICD-10-CM

## 2014-03-22 DIAGNOSIS — I6523 Occlusion and stenosis of bilateral carotid arteries: Secondary | ICD-10-CM

## 2014-03-22 DIAGNOSIS — Z87891 Personal history of nicotine dependence: Secondary | ICD-10-CM

## 2014-03-22 DIAGNOSIS — E669 Obesity, unspecified: Secondary | ICD-10-CM

## 2014-03-22 DIAGNOSIS — I69959 Hemiplegia and hemiparesis following unspecified cerebrovascular disease affecting unspecified side: Secondary | ICD-10-CM

## 2014-03-22 DIAGNOSIS — R200 Anesthesia of skin: Secondary | ICD-10-CM | POA: Insufficient documentation

## 2014-03-22 DIAGNOSIS — R531 Weakness: Secondary | ICD-10-CM | POA: Insufficient documentation

## 2014-03-22 NOTE — Addendum Note (Signed)
Addended by: Evelise Reine K on: 03/22/2014 04:26 PM   Modules accepted: Orders  

## 2014-03-22 NOTE — Patient Instructions (Signed)
Stroke Prevention Some medical conditions and behaviors are associated with an increased chance of having a stroke. You may prevent a stroke by making healthy choices and managing medical conditions. HOW CAN I REDUCE MY RISK OF HAVING A STROKE?   Stay physically active. Get at least 30 minutes of activity on most or all days.  Do not smoke. It may also be helpful to avoid exposure to secondhand smoke.  Limit alcohol use. Moderate alcohol use is considered to be:  No more than 2 drinks per day for men.  No more than 1 drink per day for nonpregnant women.  Eat healthy foods. This involves:  Eating 5 or more servings of fruits and vegetables a day.  Making dietary changes that address high blood pressure (hypertension), high cholesterol, diabetes, or obesity.  Manage your cholesterol levels.  Making food choices that are high in fiber and low in saturated fat, trans fat, and cholesterol may control cholesterol levels.  Take any prescribed medicines to control cholesterol as directed by your health care provider.  Manage your diabetes.  Controlling your carbohydrate and sugar intake is recommended to manage diabetes.  Take any prescribed medicines to control diabetes as directed by your health care provider.  Control your hypertension.  Making food choices that are low in salt (sodium), saturated fat, trans fat, and cholesterol is recommended to manage hypertension.  Take any prescribed medicines to control hypertension as directed by your health care provider.  Maintain a healthy weight.  Reducing calorie intake and making food choices that are low in sodium, saturated fat, trans fat, and cholesterol are recommended to manage weight.  Stop drug abuse.  Avoid taking birth control pills.  Talk to your health care provider about the risks of taking birth control pills if you are over 35 years old, smoke, get migraines, or have ever had a blood clot.  Get evaluated for sleep  disorders (sleep apnea).  Talk to your health care provider about getting a sleep evaluation if you snore a lot or have excessive sleepiness.  Take medicines only as directed by your health care provider.  For some people, aspirin or blood thinners (anticoagulants) are helpful in reducing the risk of forming abnormal blood clots that can lead to stroke. If you have the irregular heart rhythm of atrial fibrillation, you should be on a blood thinner unless there is a good reason you cannot take them.  Understand all your medicine instructions.  Make sure that other conditions (such as anemia or atherosclerosis) are addressed. SEEK IMMEDIATE MEDICAL CARE IF:   You have sudden weakness or numbness of the face, arm, or leg, especially on one side of the body.  Your face or eyelid droops to one side.  You have sudden confusion.  You have trouble speaking (aphasia) or understanding.  You have sudden trouble seeing in one or both eyes.  You have sudden trouble walking.  You have dizziness.  You have a loss of balance or coordination.  You have a sudden, severe headache with no known cause.  You have new chest pain or an irregular heartbeat. Any of these symptoms may represent a serious problem that is an emergency. Do not wait to see if the symptoms will go away. Get medical help at once. Call your local emergency services (911 in [place] Do not drive yourself to the hospital. Document Released: 03/19/2004 Document Revised: 06/26/2013 Document Reviewed: 08/12/2012 Exit[employer] Patient Information 2015 [employer]. This information is not intended to replace advice given   to you by your health care provider. Make sure you discuss any questions you have with your health care provider.  

## 2014-03-22 NOTE — Progress Notes (Signed)
Established Carotid Patient   History of Present Illness  Miranda Harrington is a 74 y.o. female patient of Dr. 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 She was also evaluated for varicose veins management and decided on a conservative approach.   She gets relief with compression stockings. She has been wearing these for several months and has been followed at the pain Center for this. She has had no prior surgical procedures on her veins. Other medical problems include prior hemorrhagic stroke in 2008, hypertension, hyperlipidemia, multi- joint arthritis.  Pt states residual effects from stroke are pain, tingling and numbness in right hand and foot with occasional pins and needles feeling in both, and loss of peripheral vision in right eye. At the time of her stroke she had garbled speech but this has resolved. She is seen in a pain clinic for intermittent right hand and foot pain.  Patient has not had previous carotid artery intervention.  The patient denies New Medical or Surgical History.  Pt Diabetic: No Pt smoker: former smoker, quit in 2007  Pt meds include: Statin : Yes ASA: Yes, 81 mg Other anticoagulants/antiplatelets: no   Past Medical History  Diagnosis Date  . Arthritis     osteoarthritis  . Hyperlipidemia   . Sjogren's syndrome   . Stroke     2008  . Hypertension     Social History History  Substance Use Topics  . Smoking status: Former Smoker    Types: Cigarettes    Quit date: 08/11/2005  . Smokeless tobacco: Never Used  . Alcohol Use: 1.2 - 1.8 oz/week    2-3 Glasses of wine per week     Comment: occasionally    Family History Family History  Problem Relation Age of Onset  . Other Mother     varicose veins  . Cancer Father   . Hypertension Father   . Lymphoma Maternal Aunt   . Multiple sclerosis Sister   . Rheum arthritis Mother   . Leukemia Grandchild     Surgical History Past Surgical History   Procedure Laterality Date  . Dilation and curettage of uterus    . Adenoidectomy  1968  . Tonsillectomy  1968    Allergies  Allergen Reactions  . Beeswax Hives    Bee STINGS only  . Sulfa Antibiotics Hives    Current Outpatient Prescriptions  Medication Sig Dispense Refill  . AMITIZA 24 MCG capsule Take by mouth daily.  3  . mupirocin ointment (BACTROBAN) 2 % as needed.  2  . amitriptyline (ELAVIL) 10 MG tablet Take 10 mg by mouth at bedtime.    Marland Kitchen BABY ASPIRIN PO Take 81 mg by mouth daily.    . cephALEXin (KEFLEX) 500 MG capsule Take 1 capsule (500 mg total) by mouth 4 (four) times daily. 28 capsule 0  . ciprofloxacin (CILOXAN) 0.3 % ophthalmic ointment 3 (three) times daily.    . cyclobenzaprine (FLEXERIL) 10 MG tablet Take 1 tablet (10 mg total) by mouth 3 (three) times daily. 90 tablet 6  . ezetimibe (ZETIA) 10 MG tablet Take 10 mg by mouth daily.    . hydrochlorothiazide (MICROZIDE) 12.5 MG capsule Take 12.5 mg by mouth every morning.    . lidocaine (LIDODERM) 5 % Place 1 patch onto the skin every 12 (twelve) hours as needed. Remove & Discard patch within 12 hours or as directed by MD    . Oxycodone HCl 10 MG TABS Take 10 mg by  mouth every 8 (eight) hours as needed.    Marland Kitchen oxyCODONE-acetaminophen (PERCOCET) 10-325 MG per tablet     . pregabalin (LYRICA) 75 MG capsule Take 75 mg by mouth 2 (two) times daily.    . ramipril (ALTACE) 5 MG tablet Take 5 mg by mouth daily. Take one po in the morning and one in the evening.    . rosuvastatin (CRESTOR) 40 MG tablet Take 40 mg by mouth daily.    . sertraline (ZOLOFT) 50 MG tablet TAKE ONE TABLET BY MOUTH EVERY DAY 90 tablet 0  . VOLTAREN 1 % GEL APPLY TWO GRAMS FOUR TIMES DAILY 100 g 0   No current facility-administered medications for this visit.    Review of Systems : See HPI for pertinent positives and negatives.  Physical Examination  Filed Vitals:   03/22/14 1206 03/22/14 1209  BP: 145/87 151/85  Pulse: 56 55  Resp:  16   Height:  5' 2.5" (1.588 m)  Weight:  184 lb (83.462 kg)  SpO2:  100%   Body mass index is 33.1 kg/(m^2).  General: WDWN obese female in NAD GAIT: limp, using cane Eyes: PERRLA Pulmonary:  Non-labored, CTAB, Negative  Rales, Negative rhonchi, & Negative wheezing.  Cardiac: regular Rhythm, no detected murmur.  VASCULAR EXAM Carotid Bruits Right Left   Negative Negative    Aorta is not palpable. Radial pulses are 2+ palpable and equal.                                                                                                                            LE Pulses Right Left       POPLITEAL  not palpable   not palpable       POSTERIOR TIBIAL  not palpable   not palpable        DORSALIS PEDIS      ANTERIOR TIBIAL 2+ palpable  2+ palpable     Gastrointestinal: soft, nontender, BS WNL, no r/g,  negative palpated masses.  Musculoskeletal: Negative muscle atrophy/wasting. M/S 5/5 in upper extremities, 3/5 in right LE, 4/5 in left LE, Extremities without ischemic changes.  Neurologic: A&O X 3; Appropriate Affect, decreased sensation in right first through third fingers. Speech is normal CN 2-12 intact, Pain and light touch intact in extremities, Motor exam as listed above.   Non-Invasive Vascular Imaging CAROTID DUPLEX 03/22/2014   CEREBROVASCULAR DUPLEX EVALUATION    INDICATION: Carotid disease    PREVIOUS INTERVENTION(S):     DUPLEX EXAM:     RIGHT  LEFT  Peak Systolic Velocities (cm/s) End Diastolic Velocities (cm/s) Plaque LOCATION Peak Systolic Velocities (cm/s) End Diastolic Velocities (cm/s) Plaque  71 17  CCA PROXIMAL 55 16   63 19  CCA MID 73 17   67 13  CCA DISTAL 61 17 HT  78 13  ECA 82 17   60 19 HT ICA PROXIMAL 111 33 HT  65 24  ICA MID 80 30  70 21  ICA DISTAL 63 21     0.90 ICA / CCA Ratio (PSV) 1.82  Antegrade Vertebral Flow Antegrade  158 Brachial Systolic Pressure (mmHg) 152  Multiphasic (subclavian artery) Brachial Artery Waveforms  Multiphasic (subclavian artery)    Plaque Morphology:  HM = Homogeneous, HT = Heterogeneous, CP = Calcific Plaque, SP = Smooth Plaque, IP = Irregular Plaque     ADDITIONAL FINDINGS: No significant stenosis of the bilateral external or common carotid arteries.    IMPRESSION: Doppler velocities suggest less than 40% stenoses of the bilateral proximal internal carotid arteries.    Compared to the previous exam:  No significant change in the bilateral internal carotid artery velocities noted when compared to the previous exam on 03/16/13.         Assessment: Miranda Harrington is a 74 y.o. female who presents with asymptomatic minimal bilateral ICA stenoses. No significant change in the bilateral internal carotid artery velocities noted when compared to the previous exam on 03/16/13.  Face to face time with patient was 25 minutes. Over 50% of this time was spent on counseling and coordination of care.    Plan: Follow-up in 1 year with Carotid Duplex.   I discussed in depth with the patient the nature of atherosclerosis, and emphasized the importance of maximal medical management including strict control of blood pressure, blood glucose, and lipid levels, obtaining regular exercise, and continued cessation of smoking.  The patient is aware that without maximal medical management the underlying atherosclerotic disease process will progress, limiting the benefit of any interventions. The patient was given information about stroke prevention and what symptoms should prompt the patient to seek immediate medical care. Thank you for allowing us to participate in this patient's care.  Charisse MarchSuzanne Shella Lahman, RN, MSN, FNP-C [facility] [place] Office: [phone]  Clinic Physician: Darrick PennaFields  03/22/2014  12:00 PM

## 2014-04-13 ENCOUNTER — Encounter: Payer: Self-pay | Admitting: Neurology

## 2014-04-13 ENCOUNTER — Ambulatory Visit (INDEPENDENT_AMBULATORY_CARE_PROVIDER_SITE_OTHER): Payer: Medicare Other | Admitting: Neurology

## 2014-04-13 VITALS — BP 143/82 | HR 66 | Resp 18 | Ht 61.0 in | Wt 190.0 lb

## 2014-04-13 DIAGNOSIS — G890 Central pain syndrome: Secondary | ICD-10-CM

## 2014-04-13 DIAGNOSIS — I6523 Occlusion and stenosis of bilateral carotid arteries: Secondary | ICD-10-CM

## 2014-04-13 MED ORDER — PREGABALIN 75 MG PO CAPS: 75.0000 mg | ORAL_CAPSULE | Freq: Two times a day (BID) | ORAL | Status: DC

## 2014-04-13 MED ORDER — LIDOCAINE 5 % EX PTCH: 1.0000 | MEDICATED_PATCH | Freq: Two times a day (BID) | CUTANEOUS | Status: DC | PRN

## 2014-04-13 MED ORDER — AMITRIPTYLINE HCL 10 MG PO TABS: 10.0000 mg | ORAL_TABLET | Freq: Every day | ORAL | Status: DC

## 2014-04-13 MED ORDER — DICLOFENAC SODIUM 1 % TD GEL: TRANSDERMAL | Status: DC

## 2014-04-13 NOTE — Progress Notes (Signed)
[facility]  Provider:  Melvyn Novas, M D  Referring Provider: Cain Saupe, MD Primary Care Physician:  Cain Saupe, MD  Chief Complaint  Patient presents with  . Cerebrovascular Accident    Revisit - RM -11    HPI:  Miranda Harrington is a 74 y.o. female  Is seen here as a referral/ revisit  from Dr. 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  The patient remembers that on 03/07/2006 she was snowboarding in [place] when she suffered her stroke. She is originally from [place] but spent the winters in [place]. She was doing laundry when she noticed an abnormal strange deep brain pain and pressure that seemed to have pierced  the right eye.  She was for while stunned by this but she did not feel that she passed out or lost awareness.  She was actually able to talk to her partner than a combination back to the apartment where she could day.on the bed and rest. Over the next hour or so the symptoms progressed she was unable to walk or stand again. This all happened around 5 Pm and her neighbor and friend called EMS services. She remembers that she was not able to identify her neighbor was a good friend. She can remember the sirens of the ambulance arriving at her apartment and then passed out. She was in the intensive care unit at the [facility]. After 2 or 3 days in ICU she was discharged to a regular floor and finally to a rehabilitation.  She developed intense pain in the affected right body her arms seem to burn or sting, the same for related to a lesser degree. She did not  have facial pain however. She remains numb in the right foot result proprioception and she states that she has not been able to drive she's not able to is asserted that any pedal power. She lost vision ( bilaterally)  to the right side.  When I saw [name] in 2013 she was walking with the 3 prong cane, she didn't need   indoors anymore. She has made a markable recovery from her stroke, is able to live  independent.  [name] is followed by Dr.Fulp but and after a brief on conference we agreed that she is safe to continue on a baby aspirin daily , needed for her risk factors and health factors. The patient takes Flexeril and Lyrica for the control of her post stroke pain and dysesthesias.  04-13-14 Interval history [name] is here for a yearly revisit she has had no setbacks in regards to further strokes or stroke related symptoms and her main problem is that she is unable to lose weight partially due to her gait instability and her limited ability to exercise. She continues to live independent but she has a hard time accepting that she could take walks with a cane or walker. Part of our conversation is about putting that concerned her self-image problem aside. I think that her overall health and well-being and her happiness increases if she is able to be more ambulatory.  Last year I noticed her very dry skin,  which is this year no longer present she has found a gel and body lotion that seems to do well for her.  She continues to have dysesthesias painful dysesthesias in the right hand and right foot related to her stroke. Her lidoderm and Voltaren  cream  helped this very much, but has again been denied by insurance this calendar year.  She continues to do shoulder exercises I gave her, pendular movements for the right shoulder.    Review of Systems: Out of a complete 14 system review, the patient complains of only the following symptoms, and all other reviewed systems are negative. The first, numbness, aching muscles whopping difficulties with right-sided stiffness- some hesitation in speech.   History   Social History  . Marital Status: Divorced    Spouse Name: N/A  . Number of Children: 3  . Years of Education: college   Occupational History  . Not on file.   Social History Main Topics  .  Smoking status: Former Smoker    Types: Cigarettes    Quit date: 08/11/2005  . Smokeless tobacco: Never Used  . Alcohol Use: 1.2 - 1.8 oz/week    2-3 Glasses of wine per week     Comment: occasionally  . Drug Use: No  . Sexual Activity: Not on file   Other Topics Concern  . Not on file   Social History Narrative   Patient is divorced.   Patient has three children.   Patient does not drink any caffeine.   Patient is right-handed.   Patient has a college education.    Family History  Problem Relation Age of Onset  . Other Mother     varicose veins  . Cancer Father   . Hypertension Father   . Lymphoma Maternal Aunt   . Multiple sclerosis Sister   . Rheum arthritis Mother   . Leukemia Grandchild     Past Medical History  Diagnosis Date  . Arthritis     osteoarthritis  . Hyperlipidemia   . Sjogren's syndrome   . Hypertension   . Stroke 2008    Right hand / Right foot   . Carotid artery occlusion     Past Surgical History  Procedure Laterality Date  . Dilation and curettage of uterus    . Adenoidectomy  1968  . Tonsillectomy  1968    Current Outpatient Prescriptions  Medication Sig Dispense Refill  . AMITIZA 24 MCG capsule Take by mouth daily.  3  . amitriptyline (ELAVIL) 10 MG tablet Take 10 mg by mouth at bedtime.    Marland Kitchen BABY ASPIRIN PO Take 81 mg by mouth daily.    . ciprofloxacin (CILOXAN) 0.3 % ophthalmic ointment 3 (three) times daily.    . cyclobenzaprine (FLEXERIL) 10 MG tablet Take 1 tablet (10 mg total) by mouth 3 (three) times daily. (Patient taking differently: Take 10 mg by mouth daily. ) 90 tablet 6  . ezetimibe (ZETIA) 10 MG tablet Take 10 mg by mouth daily.    . hydrochlorothiazide (MICROZIDE) 12.5 MG capsule Take 12.5 mg by mouth every morning.    . lidocaine (LIDODERM) 5 % Place 1 patch onto the skin every 12 (twelve) hours as needed. Remove & Discard patch within 12 hours or as directed by MD    . mupirocin ointment (BACTROBAN) 2 % as needed.   2  . Oxycodone HCl 10 MG TABS Take 10 mg by mouth every 8 (eight) hours as needed.    Marland Kitchen oxyCODONE-acetaminophen (PERCOCET) 10-325 MG per tablet     . pregabalin (LYRICA) 75 MG capsule Take 75 mg by mouth 2 (two) times daily.    . ramipril (ALTACE) 5 MG capsule Take 5 capsules by mouth daily.  1  . ramipril (ALTACE) 5 MG tablet Take 5  mg by mouth daily. Take one po in the morning and one in the evening.    . rosuvastatin (CRESTOR) 40 MG tablet Take 40 mg by mouth daily.    . sertraline (ZOLOFT) 50 MG tablet TAKE ONE TABLET BY MOUTH EVERY DAY 90 tablet 0  . VOLTAREN 1 % GEL APPLY TWO GRAMS FOUR TIMES DAILY 100 g 0   No current facility-administered medications for this visit.    Allergies as of 04/13/2014 - Review Complete 04/13/2014  Allergen Reaction Noted  . Beeswax Hives 03/22/2014  . Sulfa antibiotics Hives 07/14/2012    Vitals: BP 143/82 mmHg  Pulse 66  Resp 18  Ht  (1.549 m)  Wt 190 lb (86.183 kg)  BMI 35.92 kg/m2 Last Weight:  Wt Readings from Last 1 Encounters:  04/13/14 190 lb (86.183 kg)   Last Height:   Ht Readings from Last 1 Encounters:  04/13/14  (1.549 m)    Physical exam:  General: The patient is awake, alert and appears not in acute distress. The patient is well groomed. Head: Normocephalic, atraumatic. Neck is supple. Mallampati 3 , neck circumference: 16 Cardiovascular:  Regular rate and rhythm, without  murmurs or carotid bruit, and without distended neck veins. Respiratory: Lungs are clear to auscultation. Skin:  Without evidence of edema, or rash, she has cracking of the skin of all 10 finger tips. ( Sjoegren's ) Trunk: BMI is  elevated and patient  has a slightly right leaning posture, a lower right shoulder.  Neurologic exam : The patient is awake and alert, oriented to place and time.  Memory subjective  described as intact. There is a normal attention span & concentration ability.  Speech is fluent without  dysarthria, dysphonia or  aphasia. Remarkable for left sided stroke.  Mood and affect are appropriate.  Cranial nerves: Pupils are equal and briskly reactive to light. Funduscopic exam without  evidence of pallor or edema. Extraocular movements  in vertical and horizontal planes intact and without nystagmus. Visual fields by finger perimetry are abnormal-. Lower quadrant vision loss,  Restricted to the right side . Hearing to finger rub intact.  Facial sensation intact to fine touch. Facial motor strength is symmetric and tongue and uvula move midline.  Motor exam:   The patient has bilateral very good equal grip strength she has a slight deficit of coordination, supination and 8 just mildly elevated torn over the right biceps triceps and at the shoulder. She has a equal muscle bulk left and right.  Sensory:  Fine touch, pinprick and vibration were tested in all extremities.  She feels a gloved, covered layer on the right.  Proprioception is impaired on the right side. She has a lower shoulder on the right.   Coordination: Rapid alternating movements in the fingers/hands is tested and normal.  Finger-to-nose maneuver tested and normal without evidence of ataxia, dysmetria or tremor.  Gait and station: Patient walks without assistive device and is able unassisted  To climb up to the stool , climb up to the exam table.  Strength within normal limits. Stance is stable and normal. Tandem gait is fragmented. Romberg testing is positive for lateral right side.  Deep tendon reflexes: in the  upper and lower extremities are symmetric and intact. Babinski maneuver response is upgoing on the right,  downgoing left.   Assessment:  After physical and neurologic examination, review of laboratory studies, imaging, neurophysiology testing and pre-existing records, assessment is   Stroke related symptoms are still the  main sacroiliac that bothers Mrs. Colden. However she is doing well consider the degree of impairment she had been  ad[facility] [place] she has been a remarkable example for stroke rehabilitation. Tinea to be safe on a baby aspirin daily she rarely uses NSAIDs except for joint pain and then not more than once a day. Her arthritis has set her back to some degree. I will be happy to continue prescribing the Flexeril, Lyrica and the Voltaren gel, Lidoderm patches  Plan:  Treatment plan and additional workup : refills.

## 2014-05-14 ENCOUNTER — Telehealth: Payer: Self-pay | Admitting: Neurology

## 2014-05-14 NOTE — Telephone Encounter (Signed)
Ins has been contacted and provided with clinical info.  Request is currently being reviewed.  Ref Key: [id number].  I called back to advise.  Patient is aware.

## 2014-05-14 NOTE — Telephone Encounter (Signed)
Pt is calling stating she has been denied by Medicare to get her diclofenac sodium (VOLTAREN) 1 % GEL approved.  She states she cannot afford the cost of it.  She states that [facility] [address] has sent over 2 denials and she has not heard anything back and she is almost out.  Please call and advise.

## 2014-05-15 ENCOUNTER — Telehealth: Payer: Self-pay

## 2014-05-15 NOTE — Telephone Encounter (Signed)
[facility]([employer]) has approved the request for coverage on Voltaren Gel effective until 05/14/2015 Ref # [id number]

## 2014-05-21 ENCOUNTER — Other Ambulatory Visit: Payer: Self-pay

## 2014-05-21 DIAGNOSIS — Z1231 Encounter for screening mammogram for malignant neoplasm of breast: Secondary | ICD-10-CM

## 2014-05-26 ENCOUNTER — Other Ambulatory Visit: Payer: Self-pay | Admitting: Neurology

## 2014-07-02 ENCOUNTER — Ambulatory Visit
Admission: RE | Admit: 2014-07-02 | Discharge: 2014-07-02 | Disposition: A | Discharge status: Home / Self Care | Payer: Medicare Other | Source: Ambulatory Visit

## 2014-07-02 DIAGNOSIS — Z1231 Encounter for screening mammogram for malignant neoplasm of breast: Secondary | ICD-10-CM

## 2014-07-25 ENCOUNTER — Other Ambulatory Visit: Payer: Self-pay | Admitting: Neurology

## 2014-07-25 NOTE — Telephone Encounter (Signed)
Originally prescribed at [facility] on 01/23

## 2014-09-18 ENCOUNTER — Other Ambulatory Visit: Payer: Self-pay | Admitting: Neurology

## 2014-09-28 ENCOUNTER — Ambulatory Visit (INDEPENDENT_AMBULATORY_CARE_PROVIDER_SITE_OTHER): Payer: Medicare Other

## 2014-09-28 ENCOUNTER — Ambulatory Visit (INDEPENDENT_AMBULATORY_CARE_PROVIDER_SITE_OTHER): Payer: Medicare Other | Admitting: Podiatry

## 2014-09-28 VITALS — BP 111/69 | HR 69 | Resp 14

## 2014-09-28 DIAGNOSIS — M722 Plantar fascial fibromatosis: Secondary | ICD-10-CM | POA: Diagnosis not present

## 2014-09-28 DIAGNOSIS — R52 Pain, unspecified: Secondary | ICD-10-CM

## 2014-09-28 MED ORDER — TRIAMCINOLONE ACETONIDE 10 MG/ML IJ SUSP
10.0000 mg | Freq: Once | INTRAMUSCULAR | Status: AC
  Administered 2014-09-28: 10 mg

## 2014-10-01 NOTE — Progress Notes (Signed)
Subjective:     Patient ID: Miranda Harrington, female   DOB: 08-25-1940, 74 y.o.   MRN: [id number]  HPI patient states my heel has started to flareup again on my left foot and is quite sore when I walk   Review of Systems     Objective:   Physical Exam Neurovascular status intact with significant discomfort plantar aspect left heel at the insertional point tendon into the calcaneus    Assessment:     Plantar fasciitis left acute nature at the insertional point calcaneus    Plan:     Injected the left plantar fascia 3 mg Kenalog 5 mg Xylocaine which was tolerated well and advised on physical therapy

## 2014-11-16 ENCOUNTER — Other Ambulatory Visit: Payer: Self-pay | Admitting: Neurology

## 2014-11-19 ENCOUNTER — Other Ambulatory Visit: Payer: Self-pay

## 2014-11-19 MED ORDER — PREGABALIN 75 MG PO CAPS: 75.0000 mg | ORAL_CAPSULE | Freq: Two times a day (BID) | ORAL | Status: DC

## 2014-11-20 ENCOUNTER — Telehealth: Payer: Self-pay | Admitting: Neurology

## 2014-11-20 MED ORDER — LIDOCAINE 5 % EX PTCH: 1.0000 | MEDICATED_PATCH | Freq: Two times a day (BID) | CUTANEOUS | Status: DC | PRN

## 2014-11-20 NOTE — Telephone Encounter (Signed)
Patient called requesting refill lidocaine (LIDODERM) 5 % uses on bad shoulder right side and bad right foot. Doesn't use that often but it helps to take the edge off. Patient said insurance company rejected refill request the last time.

## 2014-11-20 NOTE — Telephone Encounter (Signed)
Spoke to pt and advised her that the lidocaine patch was refilled. Dr. Oliva Bustard last visit note said that she would be happy to refill the lidoderm. Pt verbalized understanding.

## 2014-11-21 NOTE — Telephone Encounter (Signed)
Patient is calling because she says [employer] says medication lidocaine (LIDODERM) 5 % patch needs prior authorization. The patient did call and get prior authorization number which is  [id number].

## 2014-11-21 NOTE — Telephone Encounter (Signed)
PA completed for Lidocaine patch. It was approved until 11/21/2015. [id number]

## 2015-02-13 ENCOUNTER — Other Ambulatory Visit: Payer: Self-pay | Admitting: Neurology

## 2015-02-27 ENCOUNTER — Encounter: Payer: Self-pay | Admitting: Podiatry

## 2015-02-27 ENCOUNTER — Ambulatory Visit (INDEPENDENT_AMBULATORY_CARE_PROVIDER_SITE_OTHER): Payer: Medicare Other | Admitting: Podiatry

## 2015-02-27 VITALS — BP 103/60 | HR 77 | Resp 12

## 2015-02-27 DIAGNOSIS — L600 Ingrowing nail: Secondary | ICD-10-CM | POA: Diagnosis not present

## 2015-02-27 DIAGNOSIS — M79675 Pain in left toe(s): Secondary | ICD-10-CM

## 2015-02-27 DIAGNOSIS — M79674 Pain in right toe(s): Secondary | ICD-10-CM

## 2015-02-27 DIAGNOSIS — B351 Tinea unguium: Secondary | ICD-10-CM

## 2015-02-27 NOTE — Patient Instructions (Signed)
Apply topical antibiotic ointment to the right toenail margins and cover with a Band-Aid until comfortable Return if you do not get adequate relief

## 2015-02-27 NOTE — Progress Notes (Signed)
   Subjective:    Patient ID: Miranda Harrington, female    DOB: [birth year], 75 y.o.   MRN: [id number]  HPI   This patient presents today complaining of approximately one month history of soreness along the medial lateral borders of the hallux toenails when walking wearing shoes and with direct pressure She says that she goes to a pedicurist to have the St toenails trim,, however the hallux nail margins remain uncomfortable. Patient recalls having the nail margins trimmed by myself multiple years ago for this similar problem which resolved the symptoms until present.  Patient has history of hemorrhagic stroke affecting the right lower extremity. Patient is ambulatory and quite active  Review of Systems  Skin: Positive for color change.       Objective:   Physical Exam  Patient appears orientated 3  Vascular: DP and PT pulses 2/4 bilaterally Capillary reflex immediate bilaterally  Neurological: Sensation to 10 g monofilament wire intact 0/5 right and 5/5 left Ankle reflexes reactive bilaterally  Dermatological: No open skin lesions bilaterally The toenails are neatly trimmed 6-10 The medial margins of the hallux toenails are mildly incurvated and and are mildly tender direct palpation. There is no surrounding erythema, edema, warmth or drainage about the medial hallux nail margins bilaterally  Musculoskeletal: Manual motor testing Dorsi flexion and plantar flexion 5/5 bilaterally        Assessment & Plan:   Assessment: Loss of protective sensation right post CVA Muscle strength appears adequate and lower extremity The medial hallux nail margins are mildly incurvated  Plan: I offered patient debridement of the hallux nail margins as this was done previously which brought her long-term relief. I made aware that if this did not provide adequate relief to nail margin could be removed  The medial margins of the right and left hallux toenail were debrided with slight bleeding  on the right. Antibiotic ointment applied to the margins with Band-Aids. Patient will continue to apply topical antibiotic ointment to the hallux nail margins and Band-Aids until healed or symptoms resolve  Reappoint at patient's request

## 2015-03-28 ENCOUNTER — Encounter (HOSPITAL_COMMUNITY): Payer: Medicare Other

## 2015-03-28 ENCOUNTER — Ambulatory Visit: Payer: Medicare Other | Admitting: Family

## 2015-03-29 ENCOUNTER — Encounter: Payer: Self-pay | Admitting: Family

## 2015-04-03 ENCOUNTER — Encounter (INDEPENDENT_AMBULATORY_CARE_PROVIDER_SITE_OTHER): Payer: Self-pay

## 2015-04-03 ENCOUNTER — Ambulatory Visit (INDEPENDENT_AMBULATORY_CARE_PROVIDER_SITE_OTHER): Payer: Medicare Other | Admitting: Family

## 2015-04-03 ENCOUNTER — Encounter: Payer: Self-pay | Admitting: Family

## 2015-04-03 ENCOUNTER — Ambulatory Visit (HOSPITAL_COMMUNITY)
Admission: RE | Admit: 2015-04-03 | Discharge: 2015-04-03 | Disposition: A | Discharge status: Home / Self Care | Payer: Medicare Other | Source: Ambulatory Visit | Attending: Family | Admitting: Family

## 2015-04-03 VITALS — BP 104/71 | HR 64 | Temp 97.3°F | Resp 14 | Ht 62.5 in | Wt 185.0 lb

## 2015-04-03 DIAGNOSIS — I69359 Hemiplegia and hemiparesis following cerebral infarction affecting unspecified side: Secondary | ICD-10-CM | POA: Diagnosis not present

## 2015-04-03 DIAGNOSIS — I6523 Occlusion and stenosis of bilateral carotid arteries: Secondary | ICD-10-CM | POA: Diagnosis not present

## 2015-04-03 DIAGNOSIS — Z87891 Personal history of nicotine dependence: Secondary | ICD-10-CM | POA: Diagnosis not present

## 2015-04-03 DIAGNOSIS — E669 Obesity, unspecified: Secondary | ICD-10-CM

## 2015-04-03 NOTE — Progress Notes (Signed)
Chief Complaint: Extracranial Carotid Artery Stenosis   History of Present Illness  Miranda Harrington is a 75 y.o. female patient of Dr. 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 She was also evaluated for varicose veins management and decided on a conservative approach.  She gets relief with compression stockings. She has been wearing these for several months and [facility] She has had no prior surgical procedures on her veins. Other medical problems include prior hemorrhagic stroke in 2008, hypertension, hyperlipidemia, multi- joint arthritis.  Pt states residual effects from stroke are pain, tingling and numbness in right hand and foot with occasional pins and needles feeling in both, and loss of peripheral vision in right eye. At the time of her stroke she had garbled speech but this has resolved. She is seen in a pain clinic for intermittent right hand and foot pain.  Patient has not had previous carotid artery intervention.  She has Sjogren's Disease, mouth stays dry.  The patient denies New Medical or Surgical History.  Pt Diabetic: No Pt smoker: former smoker, quit in 2007  Pt meds include: Statin : Yes ASA: Yes, 81 mg Other anticoagulants/antiplatelets: no    Past Medical History  Diagnosis Date  . Arthritis     osteoarthritis  . Hyperlipidemia   . Sjogren's syndrome (HCC)   . Hypertension   . Stroke [facility]) 2008    Right hand / Right foot   . Carotid artery occlusion     Social History Social History  Substance Use Topics  . Smoking status: Former Smoker    Types: Cigarettes    Quit date: 08/11/2005  . Smokeless tobacco: Never Used  . Alcohol Use: 1.2 - 1.8 oz/week    2-3 Glasses of wine per week     Comment: occasionally    Family History Family History  Problem Relation Age of Onset  . Other Mother     varicose veins  . Cancer Father   . Hypertension Father   .  Lymphoma Maternal Aunt   . Multiple sclerosis Sister   . Rheum arthritis Mother   . Leukemia Grandchild     Surgical History Past Surgical History  Procedure Laterality Date  . Dilation and curettage of uterus    . Adenoidectomy  1968  . Tonsillectomy  1968    Allergies  Allergen Reactions  . Beeswax Hives    Bee STINGS only  . Sulfa Antibiotics Hives    Current Outpatient Prescriptions  Medication Sig Dispense Refill  . AMITIZA 24 MCG capsule Take by mouth daily.  3  . amitriptyline (ELAVIL) 10 MG tablet TAKE ONE TABLET BY MOUTH AT BEDTIME 90 tablet 0  . BABY ASPIRIN PO Take 81 mg by mouth daily.    . ciprofloxacin (CILOXAN) 0.3 % ophthalmic ointment 3 (three) times daily.    . cyclobenzaprine (FLEXERIL) 10 MG tablet TAKE ONE TABLET BY MOUTH THREE TIMES DAILY 90 tablet 6  . diclofenac sodium (VOLTAREN) 1 % GEL APPLY TWO GRAMS FOUR TIMES DAILY 100 g 0  . diclofenac sodium (VOLTAREN) 1 % GEL APPLY 2 grams 4 TIMES DAILY 100 g 3  . ezetimibe (ZETIA) 10 MG tablet Take 10 mg by mouth daily.    . hydrochlorothiazide (MICROZIDE) 12.5 MG capsule Take 12.5 mg by mouth every morning.    . lidocaine (LIDODERM) 5 % Place 1 patch onto the skin every 12 (twelve) hours as needed. Remove &  Discard patch within 12 hours or as directed by MD 30 patch 5  . mupirocin ointment (BACTROBAN) 2 % as needed.  2  . Oxycodone HCl 10 MG TABS Take 10 mg by mouth every 8 (eight) hours as needed.    Marland Kitchen oxyCODONE-acetaminophen (PERCOCET) 10-325 MG per tablet     . pregabalin (LYRICA) 75 MG capsule Take 1 capsule (75 mg total) by mouth 2 (two) times daily. 180 capsule 3  . ramipril (ALTACE) 5 MG capsule Take 5 capsules by mouth daily.  1  . ramipril (ALTACE) 5 MG tablet Take 5 mg by mouth daily. Take one po in the morning and one in the evening.    . rosuvastatin (CRESTOR) 40 MG tablet Take 40 mg by mouth daily.    . sertraline (ZOLOFT) 50 MG tablet TAKE ONE TABLET BY MOUTH EVERY DAY 90 tablet 3   No current  facility-administered medications for this visit.    Review of Systems : See HPI for pertinent positives and negatives.  Physical Examination  Filed Vitals:   04/03/15 1129 04/03/15 1133  BP: 121/74 104/71  Pulse: 64 64  Temp:  97.3 F (36.3 C)  TempSrc:  Oral  Resp:  14  Height:  5' 2.5" (1.588 m)  Weight:  185 lb (83.915 kg)  SpO2:  95%   Body mass index is 33.28 kg/(m^2).   General: WDWN obese female in NAD GAIT: limp, using cane Eyes: PERRLA Pulmonary: Non-labored, CTAB, no rales,  rhonchi, or wheezing.  Cardiac: regular rhythm, no detected murmur.  VASCULAR EXAM Carotid Bruits Right Left   Negative Negative   Aorta is not palpable. Radial pulses are 2+ palpable and equal.      LE Pulses Right Left   POPLITEAL not palpable  not palpable   POSTERIOR TIBIAL not palpable  not palpable    DORSALIS PEDIS  ANTERIOR TIBIAL 2+ palpable  2+ palpable     Gastrointestinal: soft, nontender, BS WNL, no r/g, no palpable masses.  Musculoskeletal: No muscle atrophy/wasting. M/S 5/5 in upper extremities, 3/5 in right LE, 4/5 in left LE, Extremities without ischemic changes.  Neurologic: A&O X 3; Appropriate Affect, decreased sensation in right first through third fingers. Speech is normal CN 2-12 intact, Pain and light touch intact in extremities, Motor exam as listed above.          Non-Invasive Vascular Imaging CAROTID DUPLEX 04/03/2015   CEREBROVASCULAR DUPLEX EVALUATION    INDICATION: Carotid artery disease     PREVIOUS INTERVENTION(S):     DUPLEX EXAM:     RIGHT  LEFT  Peak Systolic Velocities (cm/s) End Diastolic Velocities (cm/s) Plaque LOCATION Peak Systolic Velocities (cm/s) End Diastolic Velocities (cm/s) Plaque  65 14  CCA PROXIMAL 86 24   84 28  CCA MID 85 24   74 25  CCA  DISTAL 61 26   127 25  ECA 140 30   65 22 HT ICA PROXIMAL 124 43 HT  92 32  ICA MID 77 28   54 21  ICA DISTAL 92 40     1.09 ICA / CCA Ratio (PSV) 1.45  Antegrade  Vertebral Flow Antegrade    Brachial Systolic Pressure (mmHg)   Multiphasic (Subclavian artery) Brachial Artery Waveforms Multiphasic (Subclavian artery)    Plaque Morphology:  HM = Homogeneous, HT = Heterogeneous, CP = Calcific Plaque, SP = Smooth Plaque, IP = Irregular Plaque     ADDITIONAL FINDINGS:     IMPRESSION: Bilateral internal carotid artery velocities  suggest a <40% stenosis.     Compared to the previous exam:  No significant change in comparison to the last exam on 03/22/2014.     Assessment: Miranda Harrington is a 75 y.o. female who had a hemorrhagic stroke in 2008, no stroke or TIA subsequently.  She has residual mild right hemiparesis and pain, attends a pain management clinic.  Today's carotid duplex suggests <40% ICA stenosis. No significant change in comparison to the last exam on 03/22/2014.    Plan: Follow-up in 1 year with Carotid Duplex scan.   I discussed in depth with the patient the nature of atherosclerosis, and emphasized the importance of maximal medical management including strict control of blood pressure, blood glucose, and lipid levels, obtaining regular exercise, and continued cessation of smoking.  The patient is aware that without maximal medical management the underlying atherosclerotic disease process will progress, limiting the benefit of any interventions. The patient was given information about stroke prevention and what symptoms should prompt the patient to seek immediate medical care. Thank you for allowing [place] to participate in this patient's care.  Charisse March, RN, MSN, FNP-C [facility] Office: [phone]  Clinic Physician: Darrick Penna  04/03/2015 10:50 AM

## 2015-04-03 NOTE — Addendum Note (Signed)
Addended by: Adria Dill L on: 04/03/2015 05:49 PM   Modules accepted: Orders

## 2015-04-03 NOTE — Patient Instructions (Signed)
Stroke Prevention Some medical conditions and behaviors are associated with an increased chance of having a stroke. You may prevent a stroke by making healthy choices and managing medical conditions. HOW CAN I REDUCE MY RISK OF HAVING A STROKE?   Stay physically active. Get at least 30 minutes of activity on most or all days.  Do not smoke. It may also be helpful to avoid exposure to secondhand smoke.  Limit alcohol use. Moderate alcohol use is considered to be:  No more than 2 drinks per day for men.  No more than 1 drink per day for nonpregnant women.  Eat healthy foods. This involves:  Eating 5 or more servings of fruits and vegetables a day.  Making dietary changes that address high blood pressure (hypertension), high cholesterol, diabetes, or obesity.  Manage your cholesterol levels.  Making food choices that are high in fiber and low in saturated fat, trans fat, and cholesterol may control cholesterol levels.  Take any prescribed medicines to control cholesterol as directed by your health care provider.  Manage your diabetes.  Controlling your carbohydrate and sugar intake is recommended to manage diabetes.  Take any prescribed medicines to control diabetes as directed by your health care provider.  Control your hypertension.  Making food choices that are low in salt (sodium), saturated fat, trans fat, and cholesterol is recommended to manage hypertension.  Ask your health care provider if you need treatment to lower your blood pressure. Take any prescribed medicines to control hypertension as directed by your health care provider.  If you are 18-39 years of age, have your blood pressure checked every 3-5 years. If you are 40 years of age or older, have your blood pressure checked every year.  Maintain a healthy weight.  Reducing calorie intake and making food choices that are low in sodium, saturated fat, trans fat, and cholesterol are recommended to manage  weight.  Stop drug abuse.  Avoid taking birth control pills.  Talk to your health care provider about the risks of taking birth control pills if you are over 35 years old, smoke, get migraines, or have ever had a blood clot.  Get evaluated for sleep disorders (sleep apnea).  Talk to your health care provider about getting a sleep evaluation if you snore a lot or have excessive sleepiness.  Take medicines only as directed by your health care provider.  For some people, aspirin or blood thinners (anticoagulants) are helpful in reducing the risk of forming abnormal blood clots that can lead to stroke. If you have the irregular heart rhythm of atrial fibrillation, you should be on a blood thinner unless there is a good reason you cannot take them.  Understand all your medicine instructions.  Make sure that other conditions (such as anemia or atherosclerosis) are addressed. SEEK IMMEDIATE MEDICAL CARE IF:   You have sudden weakness or numbness of the face, arm, or leg, especially on one side of the body.  Your face or eyelid droops to one side.  You have sudden confusion.  You have trouble speaking (aphasia) or understanding.  You have sudden trouble seeing in one or both eyes.  You have sudden trouble walking.  You have dizziness.  You have a loss of balance or coordination.  You have a sudden, severe headache with no known cause.  You have new chest pain or an irregular heartbeat. Any of these symptoms may represent a serious problem that is an emergency. Do not wait to see if the symptoms will   go away. Get medical help at once. Call your local emergency services (911 in [place] Do not drive yourself to the hospital.   This information is not intended to replace advice given to you by your health care provider. Make sure you discuss any questions you have with your health care provider.   Document Released: 03/19/2004 Document Revised: 03/02/2014 Document Reviewed:  08/12/2012 Elsevier Interactive Patient Education 2016 Elsevier Inc.  

## 2015-04-15 ENCOUNTER — Ambulatory Visit: Payer: Medicare Other | Admitting: Neurology

## 2015-04-19 ENCOUNTER — Ambulatory Visit: Payer: Medicare Other | Admitting: Neurology

## 2015-04-22 ENCOUNTER — Ambulatory Visit (INDEPENDENT_AMBULATORY_CARE_PROVIDER_SITE_OTHER): Payer: Medicare Other | Admitting: Neurology

## 2015-04-22 ENCOUNTER — Encounter: Payer: Self-pay | Admitting: Neurology

## 2015-04-22 VITALS — BP 118/72 | HR 82 | Resp 20 | Ht 62.5 in | Wt 188.0 lb

## 2015-04-22 DIAGNOSIS — I6523 Occlusion and stenosis of bilateral carotid arteries: Secondary | ICD-10-CM

## 2015-04-22 DIAGNOSIS — R200 Anesthesia of skin: Secondary | ICD-10-CM | POA: Diagnosis not present

## 2015-04-22 NOTE — Progress Notes (Signed)
[facility]  Provider:  Melvyn Novas, M D  Referring Provider: Cain Saupe, MD Primary Care Physician:  Cain Saupe, MD  Chief Complaint  Patient presents with  . 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 female , seen in her q 12 month revisit  from Dr. 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  The patient remembers that on 03/07/2006 she was snowboarding in [place] when she suffered her stroke. She is originally from [place] but spent the winters in [place]. She was doing laundry when she noticed an abnormal strange deep brain pain and pressure that seemed to have pierced  the right eye.  She was for while stunned by this but she did not feel that she passed out or lost awareness. She was actually able to talk to her partner than a combination back to the apartment where she could day.on the bed and rest. Over the next hour or so the symptoms progressed she was unable to walk or stand again. This all happened around 5 Pm and her neighbor and friend called EMS services. She remembers that she was not able to identify her neighbor was a good friend. She can remember the sirens of the ambulance arriving at her apartment and then passed out. She was in the intensive care unit at the [facility]. After 2 or 3 days in ICU she was discharged to a regular floor and finally to a rehabilitation.  She developed intense pain in the affected right body her arms seem to burn or sting, the same for related to a lesser degree. She did not  have facial pain however. She remains numb in the right foot result proprioception and she states that she has not been able to drive she's not able to is asserted that any pedal power. She lost vision ( bilaterally)  to the right side.When I saw Miranda Harrington in 2013 she was walking with the 3 prong cane,  she didn't need indoors anymore.She has made a markable recovery from her stroke, is able to liv independent.  Miranda Harrington is followed by Dr.Fulp -after a brief on conference we agreed that she is safe to continue on a baby aspirin daily , needed for her risk factors and health factors.  The patient takes Flexeril and Lyrica for the control of her post stroke pain and dysesthesias.  04-13-14 Interval history Mrs. [name] is here for a yearly revisit she has had no setbacks in regards to further strokes or stroke related symptoms and her main problem is that she is unable to lose weight partially due to her gait instability and her limited ability to exercise. She continues to live independent but she has a hard time accepting that she could take walks with a cane or walker. Part of our conversation is about putting that concerned her self-image problem aside. I think that her overall health and well-being and her happiness increases if she is able to be more ambulatory. Last year I noticed her very dry skin,  which is this year no longer present she has found a gel and body lotion that seems to do well for her. She continues to have dysesthesias painful dysesthesias in the right hand and right foot related to her stroke. Her lidoderm and Voltaren  cream helped this very much, but  has again been denied by insurance this calendar year.  She continues to do shoulder exercises I gave her, pendular movements for the right shoulder.   04-22-15 Miranda Harrington is here today for a yearly revisit. He performed today the [facility] cognitive assessment test and in addition the geriatric depression scale. Montreal Cognitive Assessment  04/22/2015  Visuospatial/ Executive (0/5) 3  Naming (0/3) 3  Attention: Read list of digits (0/2) 1  Attention: Read list of letters (0/1) 1  Attention: Serial 7 subtraction starting at 100 (0/3) 2  Language: Repeat phrase (0/2) 2  Language : Fluency (0/1) 0  Abstraction (0/2) 2   Delayed Recall (0/5) 4  Orientation (0/6) 6  Total 24  Adjusted Score (based on education) 24    She is fully oriented, she only missed one delayed recall words, normal obstruction normal language had some trouble with his serial sevens but her main difficulties seem to be the visual spatial questions including the trail making test, drawing a three-dimensional cube and drawing the clock face. She uses the three prong cane only outside , she speaks clearly, no slurred speech, has a little trouble with hearing , evaluated last month ; she was told she needs no hearing aids.    Review of Systems: Out of a complete 14 system review, the patient complains of only the following symptoms, and all other reviewed systems are negative. The first, numbness, aching muscles whopping difficulties with right-sided stiffness- some hesitation in speech.   Social History   Social History  . Marital Status: Divorced    Spouse Name: N/A  . Number of Children: 3  . Years of Education: college   Occupational History  . Not on file.   Social History Main Topics  . Smoking status: Former Smoker    Types: Cigarettes    Quit date: 08/11/2005  . Smokeless tobacco: Never Used  . Alcohol Use: 1.2 - 1.8 oz/week    2-3 Glasses of wine per week     Comment: occasionally  . Drug Use: No  . Sexual Activity: Not on file   Other Topics Concern  . Not on file   Social History Narrative   Patient is divorced.   Patient has three children.   Patient does not drink any caffeine.   Patient is right-handed.   Patient has a college education.    Family History  Problem Relation Age of Onset  . Other Mother     varicose veins  . Cancer Father   . Hypertension Father   . Lymphoma Maternal Aunt   . Multiple sclerosis Sister   . Rheum arthritis Mother   . Leukemia Grandchild     Past Medical History  Diagnosis Date  . Arthritis     osteoarthritis  . Hyperlipidemia   . Sjogren's syndrome (HCC)   .  Hypertension   . Stroke [facility]) 2008    Right hand / Right foot   . Carotid artery occlusion     Past Surgical History  Procedure Laterality Date  . Dilation and curettage of uterus    . Adenoidectomy  1968  . Tonsillectomy  1968    Current Outpatient Prescriptions  Medication Sig Dispense Refill  . AMITIZA 24 MCG capsule Take by mouth daily. Reported on 04/03/2015  3  . amitriptyline (ELAVIL) 10 MG tablet TAKE ONE TABLET BY MOUTH AT BEDTIME 90 tablet 0  . BABY ASPIRIN PO Take 81 mg by mouth daily.    . ciprofloxacin (CILOXAN) 0.3 %  ophthalmic ointment 3 (three) times daily. Reported on 04/03/2015    . cyclobenzaprine (FLEXERIL) 10 MG tablet TAKE ONE TABLET BY MOUTH THREE TIMES DAILY 90 tablet 6  . diclofenac sodium (VOLTAREN) 1 % GEL APPLY TWO GRAMS FOUR TIMES DAILY 100 g 0  . diclofenac sodium (VOLTAREN) 1 % GEL APPLY 2 grams 4 TIMES DAILY 100 g 3  . ezetimibe (ZETIA) 10 MG tablet Take 10 mg by mouth daily.    . hydrochlorothiazide (MICROZIDE) 12.5 MG capsule Take 12.5 mg by mouth every morning.    . lidocaine (LIDODERM) 5 % Place 1 patch onto the skin every 12 (twelve) hours as needed. Remove & Discard patch within 12 hours or as directed by MD 30 patch 5  . mupirocin ointment (BACTROBAN) 2 % as needed.  2  . Oxycodone HCl 10 MG TABS Take 10 mg by mouth every 8 (eight) hours as needed.    Marland Kitchen oxyCODONE-acetaminophen (PERCOCET) 10-325 MG per tablet Reported on 04/03/2015    . pregabalin (LYRICA) 75 MG capsule Take 1 capsule (75 mg total) by mouth 2 (two) times daily. 180 capsule 3  . ramipril (ALTACE) 5 MG capsule Take 5 capsules by mouth daily.  1  . ramipril (ALTACE) 5 MG tablet Take 5 mg by mouth daily. Take one po in the morning and one in the evening.    . rosuvastatin (CRESTOR) 40 MG tablet Take 40 mg by mouth daily.    . sertraline (ZOLOFT) 50 MG tablet TAKE ONE TABLET BY MOUTH EVERY DAY 90 tablet 3   No current facility-administered medications for this visit.    Allergies as of  04/22/2015 - Review Complete 04/22/2015  Allergen Reaction Noted  . Beeswax Hives 03/22/2014  . Sulfa antibiotics Hives 07/14/2012    Vitals: BP 118/72 mmHg  Pulse 82  Resp 20  Ht 5' 2.5" (1.588 m)  Wt 188 lb (85.276 kg)  BMI 33.82 kg/m2 Last Weight:  Wt Readings from Last 1 Encounters:  04/22/15 188 lb (85.276 kg)   Last Height:   Ht Readings from Last 1 Encounters:  04/22/15 5' 2.5" (1.588 m)    Physical exam:  General: The patient is awake, alert and appears not in acute distress. The patient is well groomed. Head: Normocephalic, atraumatic. Neck is supple. Mallampati 3 , neck circumference: 16 Cardiovascular:  Regular rate and rhythm, without  murmurs or carotid bruit, and without distended neck veins. Respiratory: Lungs are clear to auscultation. Skin:  Without evidence of edema, or rash, she has cracking of the skin of all 10 finger tips. ( Sjoegren's ) Trunk: BMI is  elevated and patient  has a slightly right leaning posture, a lower right shoulder.  Neurologic exam : The patient is awake and alert, oriented to place and time.  Memory subjective  described as intact. There is a normal attention span & concentration ability.  Speech is fluent without  dysarthria, dysphonia or aphasia. Remarkable for left sided stroke.  Mood and affect are appropriate.  Cranial nerves: Pupils are equal and briskly reactive to light. Funduscopic exam without  evidence of pallor or edema. Extraocular movements  in vertical and horizontal planes intact and without nystagmus. Visual fields by finger perimetry are abnormal-. Lower quadrant vision loss,  Restricted to the right side . Hearing to finger rub intact.  Facial sensation intact to fine touch. Facial motor strength is symmetric and tongue and uvula move midline.  Motor exam:   The patient has bilateral very good equal grip  strength she has a slight deficit of coordination, supination and 8 just mildly elevated torn over the right biceps  triceps and at the shoulder. She has a equal muscle bulk left and right. Sensory:  Fine touch, pinprick and vibration were normal  in all extremities. She has reported a positional numbness when sleeping on the left arm and coking her shoulder. Right foot tingling.  She feels a gloved, covered layer on the left. Proprioception is impaired on the left side. She has a lower shoulder on the left.  Coordination: Rapid alternating movements in the fingers/hands is tested and normal.  Finger-to-nose maneuver tested and normal without evidence of ataxia, dysmetria or tremor. Gait and station: Patient walks without assistive device and is able  (unassisted)  to climb up to the exam table. Strength within normal limits. Stance is stable and normal. Tandem gait is fragmented. Romberg testing is positive for lateral right side.  Deep tendon reflexes: in the  upper and lower extremities are symmetric and intact. Babinski maneuver response is upgoing on the right, downgoing left.   Assessment:  After physical and neurologic examination, review of laboratory studies, imaging, neurophysiology testing and MOCA , as well as her records, assessment is    1) Stroke related symptoms are still the main concern that bothers Mrs. Morden. However she is doing well consider the degree of impairment she had been admitted to the [facility] in [place] , as she has been a remarkable example for stroke rehabilitation.   2) cognitive deficits; She has the same deficit on MOCA for a long time- trail making and visual spatial problems, not so much memory difficulties.    3) Continues to be safe on a baby aspirin daily she rarely uses NSAIDs except for joint pain and then not more than once a day. Her arthritis has set her back to some degree. I will be happy to continue prescribing the Flexeril, Lyrica and the Voltaren gel, Lidoderm patches  Plan:  Treatment plan and additional workup : refills. Q 12 month MOCA .    Cabrina Shiroma, MD   CC : Dr Cain Saupe, MD

## 2015-04-30 ENCOUNTER — Other Ambulatory Visit: Payer: Self-pay | Admitting: Neurology

## 2015-06-20 ENCOUNTER — Other Ambulatory Visit: Payer: Self-pay

## 2015-06-20 DIAGNOSIS — Z1231 Encounter for screening mammogram for malignant neoplasm of breast: Secondary | ICD-10-CM

## 2015-06-24 ENCOUNTER — Telehealth: Payer: Self-pay

## 2015-06-24 ENCOUNTER — Other Ambulatory Visit: Payer: Self-pay | Admitting: Neurology

## 2015-06-24 NOTE — Telephone Encounter (Signed)
Completed PA for voltaren gel. Should receive a determination in 3-5 business days.

## 2015-06-25 NOTE — Telephone Encounter (Signed)
This encounter was created in error - please disregard.

## 2015-06-25 NOTE — Telephone Encounter (Signed)
PA for voltaren gel was denied by pt's insurance.  I called pt and informed her of this. Pt demanded that I [facility] the [facility]. She states that "it worked before" and "do what the other girl did to get it approved." Pt states that "Supposedly I have the best insurance in the world and this is ridiculous. I am not getting better, I am getting worse."  I completed an [facility]. Should hear back a determination in 7 business days.

## 2015-07-01 NOTE — Telephone Encounter (Signed)
Pt called regarding PA. Pt was very understanding when I relayed to her it could take 7 business days

## 2015-07-04 ENCOUNTER — Ambulatory Visit
Admission: RE | Admit: 2015-07-04 | Discharge: 2015-07-04 | Disposition: A | Discharge status: Home / Self Care | Payer: Medicare Other | Source: Ambulatory Visit

## 2015-07-04 DIAGNOSIS — Z1231 Encounter for screening mammogram for malignant neoplasm of breast: Secondary | ICD-10-CM

## 2015-07-20 ENCOUNTER — Emergency Department (HOSPITAL_COMMUNITY)
Admission: EM | Admit: 2015-07-20 | Discharge: 2015-07-20 | Disposition: A | Discharge status: Home / Self Care | Payer: Medicare Other | Attending: Emergency Medicine | Admitting: Emergency Medicine

## 2015-07-20 ENCOUNTER — Encounter (HOSPITAL_COMMUNITY): Payer: Self-pay

## 2015-07-20 DIAGNOSIS — I10 Essential (primary) hypertension: Secondary | ICD-10-CM | POA: Diagnosis not present

## 2015-07-20 DIAGNOSIS — Z79899 Other long term (current) drug therapy: Secondary | ICD-10-CM | POA: Diagnosis not present

## 2015-07-20 DIAGNOSIS — Z7982 Long term (current) use of aspirin: Secondary | ICD-10-CM | POA: Diagnosis not present

## 2015-07-20 DIAGNOSIS — Z791 Long term (current) use of non-steroidal anti-inflammatories (NSAID): Secondary | ICD-10-CM | POA: Diagnosis not present

## 2015-07-20 DIAGNOSIS — I83891 Varicose veins of right lower extremities with other complications: Secondary | ICD-10-CM

## 2015-07-20 DIAGNOSIS — E785 Hyperlipidemia, unspecified: Secondary | ICD-10-CM | POA: Diagnosis not present

## 2015-07-20 DIAGNOSIS — D689 Coagulation defect, unspecified: Secondary | ICD-10-CM | POA: Diagnosis present

## 2015-07-20 DIAGNOSIS — Z87891 Personal history of nicotine dependence: Secondary | ICD-10-CM | POA: Diagnosis not present

## 2015-07-20 DIAGNOSIS — I8391 Asymptomatic varicose veins of right lower extremity: Secondary | ICD-10-CM | POA: Insufficient documentation

## 2015-07-20 DIAGNOSIS — Z792 Long term (current) use of antibiotics: Secondary | ICD-10-CM | POA: Diagnosis not present

## 2015-07-20 DIAGNOSIS — M1990 Unspecified osteoarthritis, unspecified site: Secondary | ICD-10-CM | POA: Insufficient documentation

## 2015-07-20 DIAGNOSIS — Z8673 Personal history of transient ischemic attack (TIA), and cerebral infarction without residual deficits: Secondary | ICD-10-CM | POA: Insufficient documentation

## 2015-07-20 MED ORDER — DIPHENHYDRAMINE HCL 25 MG PO CAPS: 50.0000 mg | ORAL_CAPSULE | Freq: Once | ORAL | Status: DC

## 2015-07-20 NOTE — ED Notes (Signed)
Per EMS, pt from home, shaved before showering and then after shower her varicose on right posterior leg popped and began bleeding. Fire on scene stated that it was spurting out 2 feet before pressure was applied. 200 ml loss of blood maximum. Pressure dressing in place and no bleeding. Pt hypertensive with EMS 160/100. Has hx of hemorrhagic cva 9 years ago but no sx since. Pt takes asa. No blood thinner. Pt alert and oriented x 4. HR 98 and steady. RR 18. Spo2 97% on RA.

## 2015-07-20 NOTE — ED Provider Notes (Signed)
CSN: [id number]     Arrival date & time 07/20/15  1522 History   First MD Initiated Contact with Patient 07/20/15 1533     Chief Complaint  Patient presents with  . Coagulation Disorder     (Consider location/radiation/quality/duration/timing/severity/associated sxs/prior Treatment) HPI Comments: Patient presents to the emergency department with chief complaint of bleeding. She states that she may have cut herself shaving while in the shower today. Poorly she had significant bleeding from a varicose vein on her posterior distal right leg. A pressure dressing was applied by EMS. Bleeding was controlled prior to arrival. She takes a baby aspirin daily. She does not take any other anticoagulants. She denies any pain. Denies any other symptoms. She states that her blood pressure is normally well controlled, but is a little high today, perhaps from being nervous.  The history is provided by the patient. No language interpreter was used.    Past Medical History  Diagnosis Date  . Arthritis     osteoarthritis  . Hyperlipidemia   . Sjogren's syndrome (HCC)   . Hypertension   . Stroke [facility]) 2008    Right hand / Right foot   . Carotid artery occlusion    Past Surgical History  Procedure Laterality Date  . Dilation and curettage of uterus    . Adenoidectomy  1968  . Tonsillectomy  1968   Family History  Problem Relation Age of Onset  . Other Mother     varicose veins  . Cancer Father   . Hypertension Father   . Lymphoma Maternal Aunt   . Multiple sclerosis Sister   . Rheum arthritis Mother   . Leukemia Grandchild    Social History  Substance Use Topics  . Smoking status: Former Smoker    Types: Cigarettes    Quit date: 08/11/2005  . Smokeless tobacco: Never Used  . Alcohol Use: 1.2 - 1.8 oz/week    2-3 Glasses of wine per week     Comment: occasionally   OB History    No data available     Review of Systems  Skin: Positive for wound.  All other systems reviewed and are  negative.     Allergies  Bee venom; Beeswax; and Sulfa antibiotics  Home Medications   Prior to Admission medications   Medication Sig Start Date End Date Taking? Authorizing Provider  amitriptyline (ELAVIL) 10 MG tablet TAKE ONE TABLET BY MOUTH AT BEDTIME 02/13/15  Yes Carmen Dohmeier, MD  BABY ASPIRIN PO Take 81 mg by mouth daily.   Yes Historical Provider, MD  cyclobenzaprine (FLEXERIL) 10 MG tablet TAKE ONE TABLET BY MOUTH THREE TIMES DAILY Patient taking differently: TAKE ONE TABLET BY MOUTH THREE TIMES DAILY AS NEEDED FOR SPASMS 05/26/14  Yes Porfirio Mylar Dohmeier, MD  diclofenac sodium (VOLTAREN) 1 % GEL APPLY TWO GRAMS FOUR TIMES DAILY Patient taking differently: Apply 4 g topically 4 (four) times daily as needed (FOR PAIN). APPLY TWO GRAMS FOUR TIMES DAILY 04/13/14  Yes Melvyn Novas, MD  ezetimibe (ZETIA) 10 MG tablet Take 10 mg by mouth daily.   Yes Historical Provider, MD  hydrochlorothiazide (MICROZIDE) 12.5 MG capsule Take 12.5 mg by mouth every morning.   Yes Historical Provider, MD  lidocaine (LIDODERM) 5 % Place 1 patch onto the skin every 12 (twelve) hours as needed. Remove & Discard patch within 12 hours or as directed by MD 11/20/14  Yes Melvyn Novas, MD  Oxycodone HCl 10 MG TABS Take 10 mg by mouth every 8 (eight)  hours as needed (FOR PAIN).    Yes Historical Provider, MD  pregabalin (LYRICA) 75 MG capsule Take 1 capsule (75 mg total) by mouth 2 (two) times daily. 11/19/14  Yes Carmen Dohmeier, MD  ramipril (ALTACE) 5 MG tablet Take 5 mg by mouth 2 (two) times daily. Take one po in the morning and one in the evening.   Yes Historical Provider, MD  rosuvastatin (CRESTOR) 40 MG tablet Take 40 mg by mouth daily.   Yes Historical Provider, MD  sertraline (ZOLOFT) 50 MG tablet TAKE ONE TABLET BY MOUTH EVERY DAY 04/30/15  Yes Melvyn Novasarmen Dohmeier, MD  AMITIZA 24 MCG capsule Take by mouth daily. Reported on 04/03/2015 02/14/14   Historical Provider, MD  ciprofloxacin (CILOXAN) 0.3 %  ophthalmic ointment 3 (three) times daily. Reported on 04/03/2015    Historical Provider, MD  diclofenac sodium (VOLTAREN) 1 % GEL APPLY 2 grams 4 TIMES DAILY 06/24/15   Melvyn Novasarmen Dohmeier, MD  mupirocin ointment (BACTROBAN) 2 % Apply 1 application topically as needed (FOR RASHES).  12/28/13   Historical Provider, MD  oxyCODONE-acetaminophen (PERCOCET) 10-325 MG per tablet Reported on 04/03/2015 03/17/13   Historical Provider, MD  ramipril (ALTACE) 5 MG capsule Take 5 capsules by mouth daily. 04/05/14   Historical Provider, MD   BP 161/92 mmHg  Pulse 91  Temp(Src) 98.2 F (36.8 C) (Oral)  Resp 18  Ht 5\' 2"  (1.575 m)  Wt 76.204 kg  BMI 30.72 kg/m2  SpO2 100% Physical Exam  Constitutional: She is oriented to person, place, and time. She appears well-developed and well-nourished.  HENT:  Head: Normocephalic and atraumatic.  Eyes: Conjunctivae and EOM are normal.  Neck: Normal range of motion.  Cardiovascular: Normal rate and intact distal pulses.   Intact distal pulses  Pulmonary/Chest: Effort normal.  Abdominal: She exhibits no distension.  Musculoskeletal: Normal range of motion.  Neurological: She is alert and oriented to person, place, and time.  Sensation intact  Skin: Skin is dry.  Small pinpoint wound to distal posterior right calf, bleeding controlled when dressing removed by me  Foot is warm  Psychiatric: She has a normal mood and affect. Her behavior is normal. Judgment and thought content normal.  Nursing note and vitals reviewed.   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 Bleeding is controlled on arrival. I undressed the wound, applied a small amount of clot, and redressed it. Bleeding was still controlled. Patient seen by and discussed with Dr. Clarene DukeLittle, who agrees with discharge plan. She is stable and ready for  discharge.    Roxy HorsemanRobert Sharlisa Hollifield, PA-C 07/20/15 1604  Ambrose Finlandachel Morgan Clarene DukeLittle, MD 07/22/15 Rickey [id number]

## 2015-07-20 NOTE — ED Notes (Signed)
Rob PA in room, assessing wound with this RN. Pt has pinpoint sized wound to right posterior leg, no bleeding. Rob PA placed wound seal on wound. Pt being instructed on how to care for wound post d/c.

## 2015-07-20 NOTE — Discharge Instructions (Signed)
Bleeding Varicose Veins Varicose veins are veins that have become enlarged and twisted. Valves in the veins help return blood from the leg to the heart. If these valves are damaged, blood flows backward and backs up into the veins in the leg near the skin. This causes the veins to become larger because of increased pressure within them. Sometimes these veins bleed. CAUSES  Factors that can lead to bleeding varicose veins include:  Thinning of the skin that covers the veins. This skin is stretched as the veins enlarge.  Weak and thinning walls of the varicose veins. These thin walls are part of the reason why blood is not flowing normally to the heart.  Having high pressure in the veins. This high pressure occurs because the blood is not flowing freely back up to the heart.  Injury. Even a small injury to a varicose vein can cause bleeding.  Open wounds. A sore may develop near a varicose vein and not heal. This makes bleeding more likely.  Taking medicine that thins the blood. These medicines may include aspirin, anti-inflammatory medicine, and other blood thinners. SIGNS AND SYMPTOMS  If bleeding is on the outside surface of the skin, blood can be seen. Sometimes, the bleeding stays under the skin. If this happens, the blue or purple area will spread beyond the vein. This discoloration may be visible. DIAGNOSIS  To decide if you have a bleeding varicose vein, your health care provider may:  Ask about your symptoms. This will include when you first saw bleeding.  Ask about how long you have had varicose veins and if they cause you problems.  Ask about your overall health.  Ask about possible causes, such as recent cuts or if the area near the varicose veins was bumped or injured.  Examine the skin or leg that concerns you. Your health care provider will probably feel the veins.  Order imaging tests. These create detailed pictures of the veins. TREATMENT  The first goal of treating  bleeding varicose veins is to stop the bleeding. Then, the aim is to keep any bleeding from happening again. Treatment will depend on the cause of the bleeding and how bad it is. Ask your health care provider about what would be best for you. Options include:  Raising (elevating) your leg. Lie down with your leg propped up on a pillow or cushion. Your foot should be above the level of your heart.  Applying pressure to the spot that is bleeding. The bleeding should stop in a short time.  Wearing elastic stockings that "compress" your legs (compression stockings). An elastic bandage may do the same thing.  Applying an antibiotic cream on sores that are not healing.  Closing off or surgically removing the bleeding varicose veins with one of the following:  Sclerotherapy. A solution is injected into the vein to close it off.  Laser treatment. A laser is used to heat the vein to close it off.  Radiofrequency vein ablation. An electrical current produced by radio waves is used to close off the vein.  Phlebectomy. The vein is surgically removed through small incisions made over the varicose vein.  Vein ligation and stripping. The vein is surgically removed through incisions made over the varicose vein after the vein has been tied (ligated). HOME CARE INSTRUCTIONS   Apply any creams that your health care provider prescribed. Follow the directions carefully.  Wear compression stockings or any wraps as directed by your health care provider. Make sure you know:  If   you should wear them every day.  How long you should wear them.  If veins were removed or closed, a bandage (dressing) will probably cover the area. Make sure you know:  How often the dressing should be changed.  Whether the area can get wet.  When you can leave the skin uncovered.  Check your skin every day. Look for new sores and signs of bleeding.  To prevent future bleeding:  Use extra care in situations where you could  cut your legs, such as when shaving or gardening.  Try to keep your legs elevated as much as possible. Lie down when you can. SEEK MEDICAL CARE IF:   Your veins continue to bleed.  You develop new sores near your varicose veins.  You have a sore that does not heal or gets bigger.  You have increased pain in your leg.  The area around a varicose vein becomes warm, red, or tender to the touch.  You notice a yellowish fluid that smells bad coming from a spot where there was bleeding.  You have a fever. SEEK IMMEDIATE MEDICAL CARE IF:   You have chest pain or difficulty breathing.  You have severe leg pain.   This information is not intended to replace advice given to you by your health care provider. Make sure you discuss any questions you have with your health care provider.   Document Released: 06/28/2008 Document Revised: 03/02/2014 Document Reviewed: 06/13/2013 Elsevier Interactive Patient Education 2016 [employer]vier Inc.  

## 2015-07-23 ENCOUNTER — Telehealth: Payer: Self-pay

## 2015-07-23 ENCOUNTER — Other Ambulatory Visit: Payer: Self-pay | Admitting: *Deleted

## 2015-07-23 DIAGNOSIS — I83891 Varicose veins of right lower extremities with other complications: Secondary | ICD-10-CM

## 2015-07-23 NOTE — Telephone Encounter (Signed)
Pt. called and requested a note, releasing her to fly tomorrow.  Reported she went to the ER over the weekend, due to a burst varicose vein of right leg.  Reported she will not be able to fly without the release.  Stated [place], to attend her grandaughter's wedding.  Was advised by the ER physician to f/u with her vascular surgeon.  Advised pt. She will need to be seen in our office before a note can be given to release her to fly.  Appt. Given for 3:00 PM today with NP.  Pt. Agreed.

## 2015-07-23 NOTE — Addendum Note (Signed)
Addended by: Phillips OdorPULLINS, Emon Miggins S on: 07/23/2015 12:08 PM   Modules accepted: Orders

## 2015-07-23 NOTE — Telephone Encounter (Signed)
Pt. Notified that a right leg venous reflux study will need to be done to give authorization to fly.  Appt. Changed from 5/30 to 07/24/15 ; advised to have right leg venous reflux at 10:30 AM, and appt. With MD at 11:00 AM.  Pt. Verb. Understanding.

## 2015-07-24 ENCOUNTER — Ambulatory Visit (INDEPENDENT_AMBULATORY_CARE_PROVIDER_SITE_OTHER): Payer: Medicare Other | Admitting: Vascular Surgery

## 2015-07-24 ENCOUNTER — Encounter: Payer: Self-pay | Admitting: Vascular Surgery

## 2015-07-24 ENCOUNTER — Ambulatory Visit (HOSPITAL_COMMUNITY)
Admission: RE | Admit: 2015-07-24 | Discharge: 2015-07-24 | Disposition: A | Discharge status: Home / Self Care | Payer: Medicare Other | Source: Ambulatory Visit | Attending: Vascular Surgery | Admitting: Vascular Surgery

## 2015-07-24 VITALS — BP 121/78 | HR 68 | Temp 98.0°F | Resp 16 | Ht 62.0 in | Wt 182.0 lb

## 2015-07-24 DIAGNOSIS — I6523 Occlusion and stenosis of bilateral carotid arteries: Secondary | ICD-10-CM

## 2015-07-24 DIAGNOSIS — I83891 Varicose veins of right lower extremities with other complications: Secondary | ICD-10-CM

## 2015-07-24 NOTE — Progress Notes (Signed)
[facility]  Patient name: Miranda Harrington Ninh MRN: [id number] DOB: 08-22-1940 Sex: female  REASON FOR VISIT: Evaluation from bleeding from her right calf varicosity and 07/20/2015  HPI: Miranda Harrington Guitron is a 75 y.o. 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 She does have known mild venous hypertension. On 07/20/2015 after showering and while shaving her legs she had the significant bleeding from her right posterior calf. She called EMS who controlled this with an Ace wrap and she was transferred to the hospital. They applied a topical agent and she was discharged home.  Past Medical History  Diagnosis Date  . Arthritis     osteoarthritis  . Hyperlipidemia   . Sjogren's syndrome (HCC)   . Hypertension   . Stroke [facility](HCC) 2008    Right hand / Right foot   . Carotid artery occlusion   . Varicose veins     bleeding episode from varicose vein right posterior calf 07-20-2015      Family History  Problem Relation Age of Onset  . Other Mother     varicose veins  . Cancer Father   . Hypertension Father   . Lymphoma Maternal Aunt   . Multiple sclerosis Sister   . Rheum arthritis Mother   . Leukemia Grandchild     SOCIAL HISTORY: Social History  Substance Use Topics  . Smoking status: Former Smoker    Types: Cigarettes    Quit date: 08/11/2005  . Smokeless tobacco: Never Used  . Alcohol Use: 1.2 - 1.8 oz/week    2-3 Glasses of wine per week     Comment: occasionally    Allergies  Allergen Reactions  . Bee Venom Anaphylaxis and Hives  . Beeswax Hives    Bee STINGS only  . Sulfa Antibiotics Hives    Current Outpatient Prescriptions  Medication Sig Dispense Refill  . amitriptyline (ELAVIL) 10 MG tablet TAKE ONE TABLET BY MOUTH AT BEDTIME 90 tablet 0  . aspirin EC 81 MG tablet Take 81 mg by mouth daily.    . cyclobenzaprine (FLEXERIL) 10 MG tablet TAKE ONE TABLET BY MOUTH THREE TIMES DAILY (Patient  taking differently: TAKE ONE TABLET BY MOUTH THREE TIMES DAILY AS NEEDED FOR SPASMS) 90 tablet 6  . diclofenac sodium (VOLTAREN) 1 % GEL APPLY TWO GRAMS FOUR TIMES DAILY (Patient taking differently: Apply 4 g topically 4 (four) times daily as needed (FOR PAIN). APPLY TWO GRAMS FOUR TIMES DAILY) 100 g 0  . diclofenac sodium (VOLTAREN) 1 % GEL APPLY 2 grams 4 TIMES DAILY 100 g 0  . ezetimibe (ZETIA) 10 MG tablet Take 10 mg by mouth daily.    . hydrochlorothiazide (MICROZIDE) 12.5 MG capsule Take 12.5 mg by mouth every morning.    . lidocaine (LIDODERM) 5 % Place 1 patch onto the skin every 12 (twelve) hours as needed. Remove & Discard patch within 12 hours or as directed by MD 30 patch 5  . mupirocin ointment (BACTROBAN) 2 % Apply 1 application topically as needed (FOR RASHES).   2  . Oxycodone HCl 10 MG TABS Take 10 mg by mouth every 8 (eight) hours as needed (FOR PAIN).     . pregabalin (LYRICA) 75 MG capsule Take 1 capsule (75 mg total) by mouth 2 (two) times daily. 180 capsule 3  . ramipril (ALTACE) 5 MG tablet Take 5 mg by mouth 2 (two) times daily. Take one po in the morning and one in the evening.    .Marland Kitchen  rosuvastatin (CRESTOR) 40 MG tablet Take 40 mg by mouth daily.    . sertraline (ZOLOFT) 50 MG tablet TAKE ONE TABLET BY MOUTH EVERY DAY 90 tablet 0   No current facility-administered medications for this visit.    REVIEW OF SYSTEMS:   denotes positive finding,  denotes negative finding Cardiac  Comments:  Chest pain or chest pressure:    Shortness of breath upon exertion:    Short of breath when lying flat:    Irregular heart rhythm:        Vascular    Pain in calf, thigh, or hip brought on by ambulation:    Pain in feet at night that wakes you up from your sleep:     Blood clot in your veins:    Leg swelling:         Pulmonary    Oxygen at home:    Productive cough:     Wheezing:         Neurologic    Sudden weakness in arms or legs:     Sudden numbness in arms or legs:      Sudden onset of difficulty speaking or slurred speech:    Temporary loss of vision in one eye:     Problems with dizziness:         Gastrointestinal    Blood in stool:     Vomited blood:         Genitourinary    Burning when urinating:     Blood in urine:        Psychiatric    Major depression:         Hematologic    Bleeding problems:    Problems with blood clotting too easily:        Skin    Rashes or ulcers:        Constitutional    Fever or chills:      PHYSICAL EXAM: Filed Vitals:   07/24/15 1108  BP: 121/78  Pulse: 68  Temp: 98 F (36.7 C)  TempSrc: Oral  Resp: 16  Height:  (1.575 m)  Weight: 182 lb (82.555 kg)  SpO2: 99%    GENERAL: The patient is a well-nourished female, in no acute distress. The vital signs are documented above. VASCULAR: Palpable dorsalis pedis pulses bilaterally PULMONARY: There is good air exchange  MUSCULOSKELETAL: There are no major deformities or cyanosis. NEUROLOGIC: No focal weakness or paresthesias are detected. SKIN: There are no ulcers or rashes noted. PSYCHIATRIC: The patient has a normal affect. She does have a small punctate area on the posterior right calf. There is a slight eschar but no evidence of a large varix under this area. This was the site of bleeding.  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 She has a large varices arising off of this going down to her calf  MEDICAL ISSUES: Single episode of bleeding from superficial varix in her right posterior calf. I explained that this occasionally is an indication for recurrent bleeding for ablation of her saphenous vein. I am comfortable with observation only at this time. If she continues to have difficulty in the future would consider laser ablation. She is to fly to dialysis have a grandchild's wedding later  today. I do not see any contraindication at all for her flying related to this  recent event. She will continue her follow-up with Dr. Darrick Penna in February for asymptomatic carotid disease.  Again she was seen today and has no contraindication for airline flight today as scheduled.    Gretta Began [facility] [phone]

## 2015-08-12 ENCOUNTER — Other Ambulatory Visit: Payer: Self-pay

## 2015-08-12 MED ORDER — PREGABALIN 75 MG PO CAPS: 75.0000 mg | ORAL_CAPSULE | Freq: Two times a day (BID) | ORAL | Status: DC

## 2015-08-12 NOTE — Telephone Encounter (Signed)
I called [facility]. Pt's lyrica RX should last a year, last refilled in 10/2014. I was advised by [facility] that since lyrica is a controlled substance the RX expires after 6 months- therefore a new RX is needed.   RX for lyrica faxed to [facility].

## 2015-08-19 ENCOUNTER — Other Ambulatory Visit: Payer: Self-pay | Admitting: Neurology

## 2015-11-01 ENCOUNTER — Other Ambulatory Visit: Payer: Self-pay | Admitting: Neurology

## 2015-11-25 ENCOUNTER — Other Ambulatory Visit: Payer: Self-pay | Admitting: Neurology

## 2015-12-22 IMAGING — MG MM SCREENING BREAST TOMO BILATERAL
8 series · 8 of 24 positions shown · non-contrast
Comparison: Previous exam(s).

ACR Breast Density Category a: The breast tissue is almost entirely
fatty.

CLINICAL DATA: Screening.

EXAM:
DIGITAL SCREENING BILATERAL MAMMOGRAM WITH 3D TOMO WITH CAD

[R MLO]
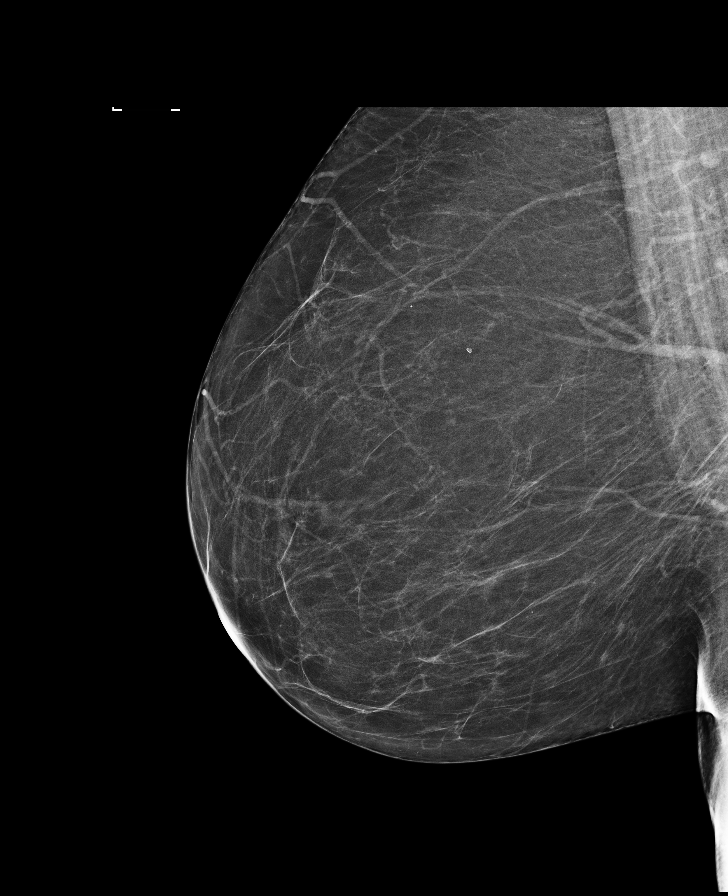

[L CC]
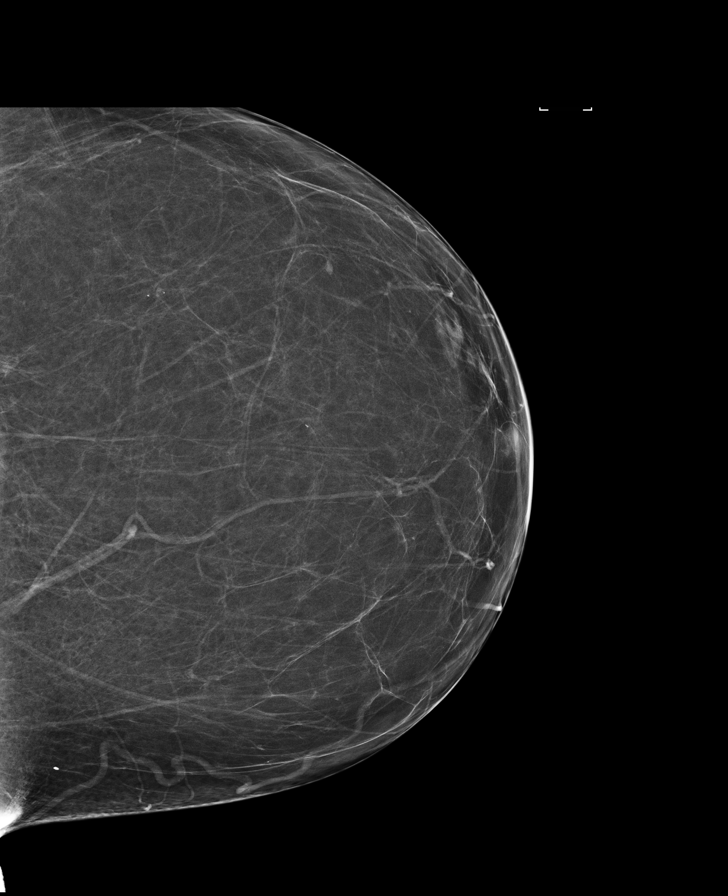

[L MLO]
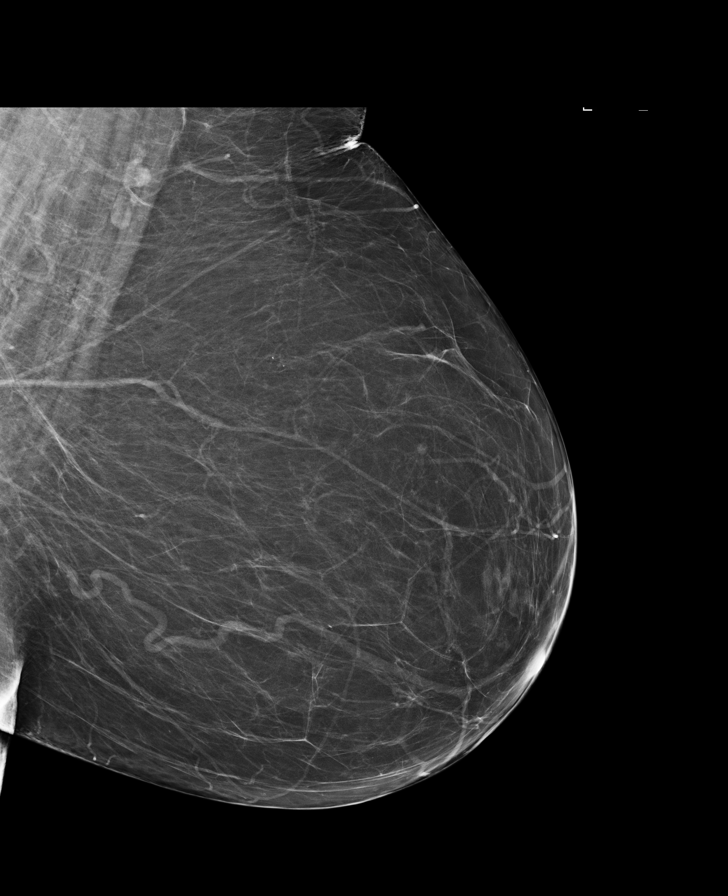

[R CC]
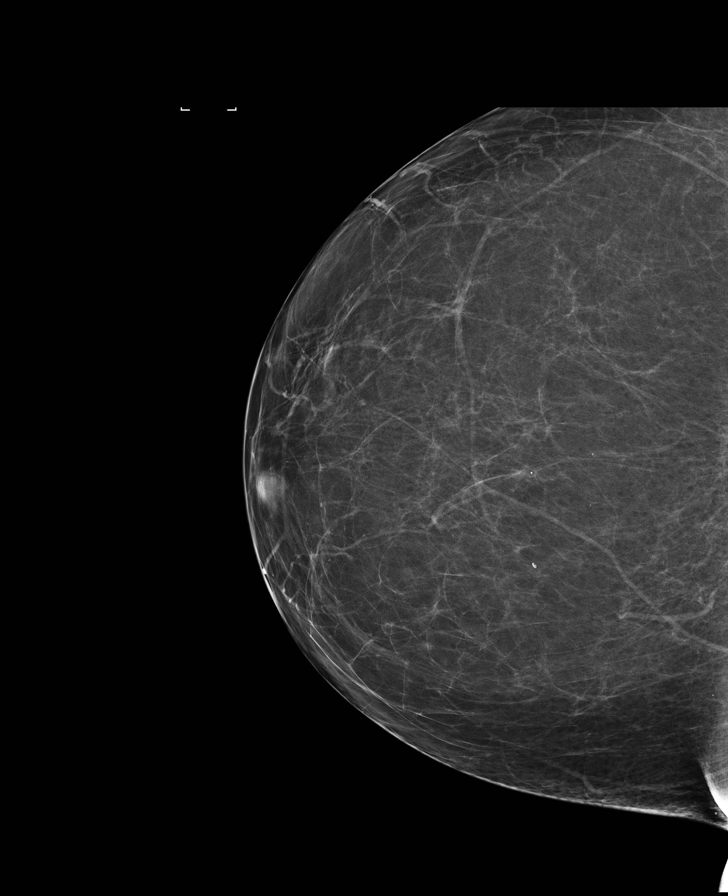

[L MLO tomo · tomo slice 41/80.0]
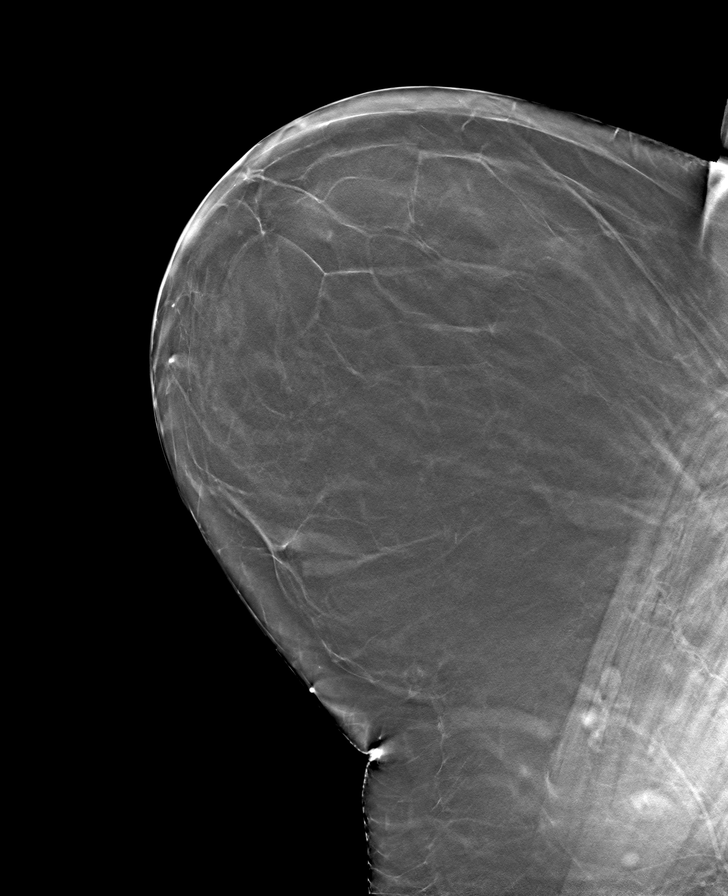

[L CC tomo · tomo slice 37/72.0]
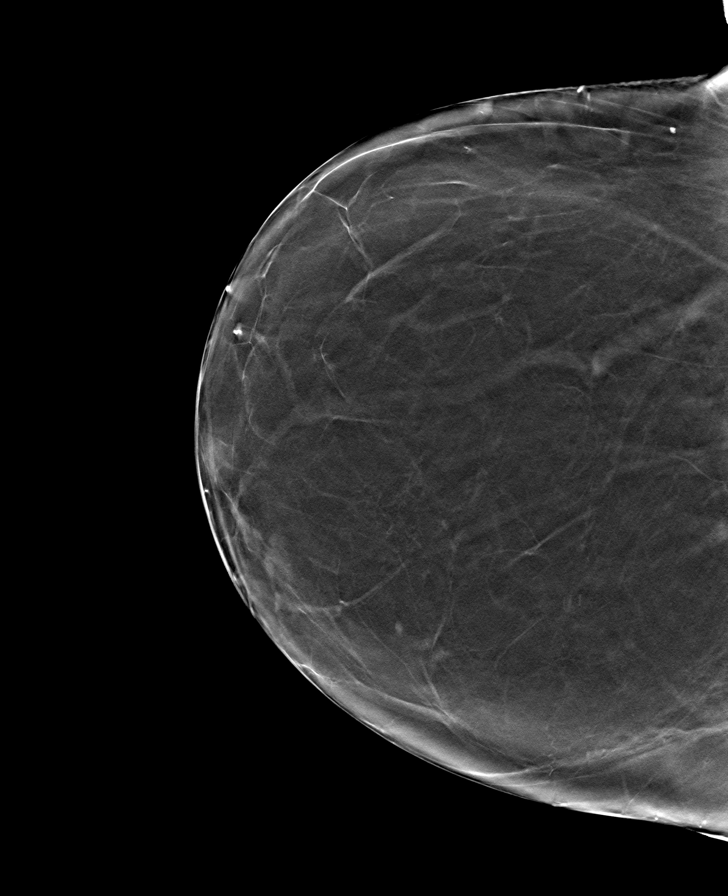

[R MLO tomo · tomo slice 40/79.0]
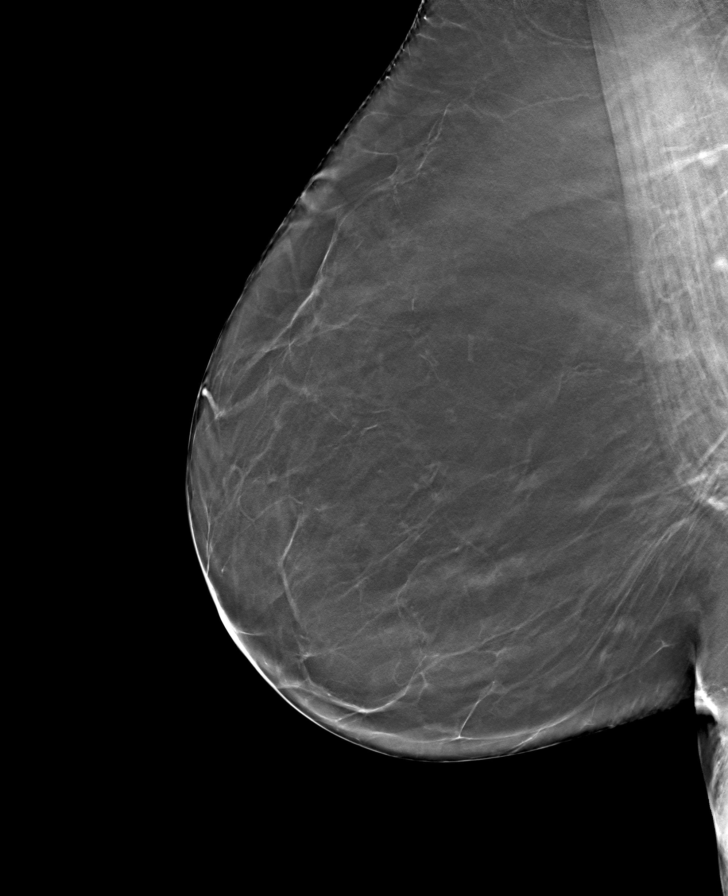

[R CC tomo · tomo slice 37/73.0]
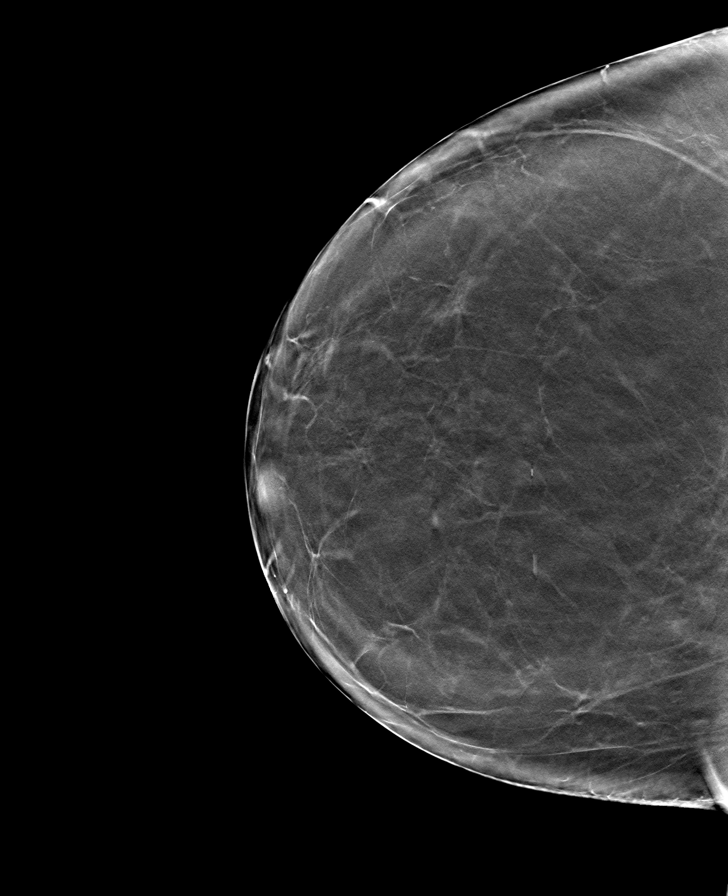

[8 of 24 positions shown; findings below may reference images not displayed]

FINDINGS: There are no findings suspicious for malignancy. Images were
processed with CAD.
IMPRESSION: No mammographic evidence of malignancy. A result letter of this
screening mammogram will be mailed directly to the patient.

RECOMMENDATION:
Screening mammogram in one year. (Code:HC-M-GZ7)

BI-RADS CATEGORY  1: Negative.

## 2016-01-27 ENCOUNTER — Other Ambulatory Visit: Payer: Self-pay | Admitting: Neurology

## 2016-02-10 ENCOUNTER — Telehealth (HOSPITAL_COMMUNITY): Payer: Self-pay | Admitting: *Deleted

## 2016-02-10 NOTE — Telephone Encounter (Signed)
I informed the patient that based on current medical guidelines and changes to our surveillance guidelines she did not need an annual carotid ultrasound.  I stressed to the patient that their appointment for their carotid ultrasound and provider appointment to discuss the results would be rescheduled based on these new guidelines to four years from now.  I explained that in the event the experience any symptoms suggestive of TIA/stroke to call 911 following this to call our office and they will be seen appropriately. I also explained this change does not affect any other medical issues we  following.

## 2016-02-11 ENCOUNTER — Other Ambulatory Visit: Payer: Self-pay | Admitting: Neurology

## 2016-02-15 ENCOUNTER — Other Ambulatory Visit: Payer: Self-pay | Admitting: Neurology

## 2016-03-05 ENCOUNTER — Telehealth: Payer: Self-pay | Admitting: *Deleted

## 2016-03-05 NOTE — Telephone Encounter (Signed)
Pt states she has some questions about ingrown toenail procedures. I explained that once she and the doctor had decided on a procedure to correct the ingrown nails, the doctor would numb the areas while using a freeze/numbing spray, then the assistant would set up the surgical equipment and explain post op cleansing/soaking instructions, the assistant would clean the toe with an antiseptic, then exsanguinate the toe and place a tourniquet around the toe. The doctor will return and remove the band around the toe and make a slit in the nail to beneath the cuticle and if the procedure is to be permanent he will apply a chemical that will kill the nailbed in that area to keep the nail from growing back improperly. I told the pt she would perform the cleansing/soaking for at least 4-6 weeks until the area had a dry hard scab without redness, drainage, swelling or pain. I told pt she would have varying degrees of discomfort over the next 4-6 weeks that she would be able to take Ibuprofen or tylenol which ever she could tolerate for the discomfort. I told her that at any time the symptoms worsened or she got a fever she should call as soon as possible. Pt states understanding and said she would call after April because she has a great-niece's wedding.

## 2016-03-12 ENCOUNTER — Other Ambulatory Visit: Payer: Self-pay | Admitting: Neurology

## 2016-03-16 ENCOUNTER — Telehealth: Payer: Self-pay

## 2016-03-16 ENCOUNTER — Telehealth: Payer: Self-pay | Admitting: Neurology

## 2016-03-16 ENCOUNTER — Encounter: Payer: Self-pay | Admitting: Neurology

## 2016-03-16 DIAGNOSIS — B0229 Other postherpetic nervous system involvement: Secondary | ICD-10-CM

## 2016-03-16 DIAGNOSIS — G890 Central pain syndrome: Secondary | ICD-10-CM

## 2016-03-16 HISTORY — DX: Other postherpetic nervous system involvement: B02.29

## 2016-03-16 MED ORDER — LIDOCAINE 5 % EX PTCH: 1.0000 | MEDICATED_PATCH | CUTANEOUS | 5 refills | Status: DC

## 2016-03-16 NOTE — Addendum Note (Signed)
Addended by: Melvyn NovasHMEIER, Ashar Lewinski on: 03/16/2016 05:14 PM   Modules accepted: Orders

## 2016-03-16 NOTE — Telephone Encounter (Signed)
I called pt. She does not remember trying gabapentin. I advised her that we cannot fill this out to reflect that she has tried gabapentin in this case. Pt verbalized understanding. Pt still wants me to complete the appeal paperwork and if this is again denied, she will discuss with Dr. Lendon ColonelHawks taking over her lidocaine patches.  Dr. Vickey Hugerohmeier signed appeal paperwork, saying that pt should not take gabapentin because it will not help with her shoulder pain and may cause excessive drowsiness. [facility]

## 2016-03-16 NOTE — Telephone Encounter (Signed)
Miranda Harrington with [employer] clinical appeals is calling to advise that a request form to be filled out for PA for medication lidocaine (LIDODERM) 5 % has been faxed to our office.

## 2016-03-16 NOTE — Telephone Encounter (Signed)
PA for voltaren gel completed. Approved by [employer] through 02/22/2017. PA # [id number].  [facility] notified.

## 2016-03-16 NOTE — Telephone Encounter (Signed)
Patient is calling to get PA for  lidocaine (LIDODERM) 5 % patch.

## 2016-03-16 NOTE — Telephone Encounter (Signed)
This patient has thalamic pain and postherpetic neuralgia. CD

## 2016-03-16 NOTE — Telephone Encounter (Signed)
Pt's lidoderm patches were denied because pt does not have a diagnosis of diabetic neuropathy or post herpetic neuralgia.  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  Dr. Vickey Hugerohmeier, if you agree, will you place "post herpetic neuralgia" as a diagnosis in pt's chart so that this appeal may be completed?

## 2016-03-16 NOTE — Telephone Encounter (Signed)
Patient called to give an updated on PA Lidoderm patch, per patient it has been denied and has filed an appeal. [id number] (appeal #) and wanted to advise insurance company may be calling/faxing information for appeal.

## 2016-03-31 ENCOUNTER — Encounter: Payer: Medicare Other | Admitting: Podiatry

## 2016-04-02 ENCOUNTER — Inpatient Hospital Stay (HOSPITAL_COMMUNITY)
Admission: RE | Admit: 2016-04-02 | Discharge: 2016-04-02 | Disposition: A | Discharge status: Home / Self Care | Payer: Medicare Other | Source: Ambulatory Visit

## 2016-04-02 ENCOUNTER — Ambulatory Visit: Payer: Medicare Other | Admitting: Family

## 2016-04-02 DIAGNOSIS — Z87891 Personal history of nicotine dependence: Secondary | ICD-10-CM

## 2016-04-02 DIAGNOSIS — I6523 Occlusion and stenosis of bilateral carotid arteries: Secondary | ICD-10-CM

## 2016-04-02 DIAGNOSIS — E669 Obesity, unspecified: Secondary | ICD-10-CM

## 2016-04-02 DIAGNOSIS — I69359 Hemiplegia and hemiparesis following cerebral infarction affecting unspecified side: Secondary | ICD-10-CM

## 2016-04-17 ENCOUNTER — Ambulatory Visit: Payer: Medicare Other | Admitting: Podiatry

## 2016-04-21 ENCOUNTER — Ambulatory Visit: Payer: Medicare Other | Admitting: Adult Health

## 2016-05-02 ENCOUNTER — Other Ambulatory Visit: Payer: Self-pay | Admitting: Neurology

## 2016-05-04 ENCOUNTER — Ambulatory Visit: Payer: Medicare Other | Admitting: Podiatry

## 2016-05-04 NOTE — Telephone Encounter (Signed)
RX for lyrica faxed to [facility]. Received a receipt of confirmation.

## 2016-05-11 ENCOUNTER — Encounter: Payer: Self-pay | Admitting: Podiatry

## 2016-05-11 ENCOUNTER — Ambulatory Visit (INDEPENDENT_AMBULATORY_CARE_PROVIDER_SITE_OTHER): Payer: Medicare Other | Admitting: Podiatry

## 2016-05-11 ENCOUNTER — Ambulatory Visit (INDEPENDENT_AMBULATORY_CARE_PROVIDER_SITE_OTHER): Payer: Medicare Other

## 2016-05-11 DIAGNOSIS — I6523 Occlusion and stenosis of bilateral carotid arteries: Secondary | ICD-10-CM

## 2016-05-11 DIAGNOSIS — M722 Plantar fascial fibromatosis: Secondary | ICD-10-CM

## 2016-05-11 DIAGNOSIS — M205X2 Other deformities of toe(s) (acquired), left foot: Secondary | ICD-10-CM | POA: Diagnosis not present

## 2016-05-11 DIAGNOSIS — L600 Ingrowing nail: Secondary | ICD-10-CM

## 2016-05-11 DIAGNOSIS — M79672 Pain in left foot: Secondary | ICD-10-CM | POA: Diagnosis not present

## 2016-05-11 MED ORDER — TRIAMCINOLONE ACETONIDE 10 MG/ML IJ SUSP
10.0000 mg | Freq: Once | INTRAMUSCULAR | Status: AC
  Administered 2016-05-11: 10 mg

## 2016-05-11 NOTE — Progress Notes (Signed)
Subjective:     Patient ID: Miranda Harrington, female   DOB: 1940-09-03, 76 y.o.   MRN: [id number]  HPI patient states the left heel has been bothering her quite a bit for the last month and also the ingrown at work in a fixed in April is very sore along with a bad big toe joint right foot. [place] and does not want any permanent procedure on the nail until April   Review of Systems     Objective:   Physical Exam Neurovascular status intact negative Homans sign noted with patient found to have exquisite discomfort left plantar fascia at the insertional point tendon calcaneus with incurvated left hallux medial border that's painful. On the right there is a lot of bone spurring around the first MPJ with significant range of motion loss and increased pain over the last couple years due to the size of the bone    Assessment:     Acute plantar fasciitis left along with ingrown toenail left hallux and hallux limitus rigidus deformity right    Plan:     H&P conditions reviewed and today I injected the left plantar fascia 3 Milligan Kenalog 5 mill grams Xylocaine and I discussed at one point in future hallux limitus correction. I reviewed hallux limitus and what would be required we'll discuss that at next visit and I also went ahead today and discussed the ingrown toenail which will be fixed in April

## 2016-05-30 ENCOUNTER — Other Ambulatory Visit: Payer: Self-pay | Admitting: Neurology

## 2016-06-01 ENCOUNTER — Other Ambulatory Visit: Payer: Self-pay | Admitting: Family Medicine

## 2016-06-01 DIAGNOSIS — Z1231 Encounter for screening mammogram for malignant neoplasm of breast: Secondary | ICD-10-CM

## 2016-06-03 ENCOUNTER — Ambulatory Visit (INDEPENDENT_AMBULATORY_CARE_PROVIDER_SITE_OTHER): Payer: Medicare Other | Admitting: Podiatry

## 2016-06-03 ENCOUNTER — Telehealth: Payer: Self-pay | Admitting: *Deleted

## 2016-06-03 DIAGNOSIS — L600 Ingrowing nail: Secondary | ICD-10-CM

## 2016-06-03 NOTE — Patient Instructions (Signed)
Using over-the-counter soft soap place a tablespoon in a quart of water and soak 1-2 minutes daily. Apply topical antibiotic ointment and a Band-Aid to the surgical site until a scab forms

## 2016-06-03 NOTE — Progress Notes (Signed)
Patient ID: Miranda Harrington, female   DOB: 11-21-1940, 76 y.o.   MRN: [id number]   Subjective: This patient presents today complaining of six-month history of pain along the medial margin of the left hallux toenail there is gradually increase in pain and discomfort over time. Patient had debridement of the margin on 05/11/2016 with slight temporary reduction of symptoms.  Objective: Orientated 3 DP and PT pulses 2/4 bilaterally Capillary reflex immediate bilaterally Sensation to 10 g monofilament wire and intact 0/5 right and 4/5 left Vibratory sensation nonreactive right reactive left Ankle reflexes reactive bilaterally No open skin lesions bilaterally Atrophic skin bilaterally The medial margin of the left hallux toenails incurvated with a callused nail groove and tenderness to direct palpation Hallux rigidus right Hammertoe second left  Assessment: Satisfactory vascular status History of CVA affecting right lower extremity Ingrowing medial margin of left hallux toenail  Plan: Discuss treatment options with patient including repetitive debridement versus permanent nail removal and patient verbally consents to permanent nail removal  Left hallux was blocked with 3 mL of 50-50 mixture of 2% plain Xylocaine and 0.5% plain Sensorcaine. Toe is prepped with Betadine exsanguinated. The medial margin the left hallux toenail was excised and a phenol matricectomy performed. The wound site was flushed with alcohol and an antibiotic compression dressing applied. The tourniquet was released with spontaneous capillary fill time left hallux. Patient tolerated procedure without any difficulty. Postoperative oral instructions provided. Patient will return if she has any future concerns

## 2016-06-03 NOTE — Telephone Encounter (Addendum)
Pt states the toe that Dr. Leeanne Deed performed a toenail procedure has bleed through the dressing. I told pt that is not unusual, and if she was on a blood thinner it may increase the bleeding. Pt states she takes a baby aspirin okayed by Dr. Darrick Penna. I told pt to reinforce the dressing with gauze, and rest and elevate, if continues to bleed begin her soaking tonight and redress with a lot of gauze. I told pt she may have 4-6 hours of anesthesia left and may take tylenol or ibuprofen OTC if tolerates, take as directed on package. Pt states understanding.06/08/2016-Pt states has questions about the soaks. I spoke with pt and she states she has been soaking for 10 minutes twice daily and would only need to do it for two weeks. I told pt she needed to be soaking at least 15-20 minutes per day for 4-6 weeks, and at the end of the fourth week she should do the last soak of the day leave off the antibiotic ointment and bandaid and if the area gets a dry hard scab without redness, swelling, drainage or pain could stop the soaks,but if any of the symptoms continued she would need to continue the soaks for 2 more weeks and check again. Pt states understanding.

## 2016-06-10 ENCOUNTER — Encounter (INDEPENDENT_AMBULATORY_CARE_PROVIDER_SITE_OTHER): Payer: Self-pay

## 2016-06-10 ENCOUNTER — Encounter: Payer: Self-pay | Admitting: Adult Health

## 2016-06-10 ENCOUNTER — Ambulatory Visit (INDEPENDENT_AMBULATORY_CARE_PROVIDER_SITE_OTHER): Payer: Medicare Other | Admitting: Adult Health

## 2016-06-10 VITALS — BP 133/83 | HR 61 | Wt 188.6 lb

## 2016-06-10 DIAGNOSIS — I6523 Occlusion and stenosis of bilateral carotid arteries: Secondary | ICD-10-CM

## 2016-06-10 DIAGNOSIS — Z8673 Personal history of transient ischemic attack (TIA), and cerebral infarction without residual deficits: Secondary | ICD-10-CM | POA: Diagnosis not present

## 2016-06-10 DIAGNOSIS — R413 Other amnesia: Secondary | ICD-10-CM

## 2016-06-10 DIAGNOSIS — R202 Paresthesia of skin: Secondary | ICD-10-CM

## 2016-06-10 NOTE — Patient Instructions (Signed)
Stop Flexeril  Memory score is slightly declined- we will continue to monitor We will recheck in 4 months If your symptoms worsen or you develop new symptoms please let us know.

## 2016-06-10 NOTE — Progress Notes (Signed)
PATIENT: DianBelanna ManringDOB: 02-28-1940  REASON FOR VISIT: follow up- history of stroke, memory disturbance, paresthesias in the right HISTORY FROM: patient  HISTORY OF PRESENT ILLNESS: Ms. Stene is a 76 year old female with a history of stroke, memory disturbance and paresthesias on the right. She returns today for follow-up. She is currently on aspirin for stroke prevention. Blood pressure is in normal range. She reports that her primary care is managing her cholesterol. She states that she has noticed some changes with her memory. Although she reports that her significant other passed away in 22-Oct-2022. She states that she still cries at least once a day over the death. She lives at home alone although she does have family here locally. She is able to complete all ADLs independently. She has not operated a motor vehicle since her stroke. She is able to manage her own finances as well as cook her own meals. Reports good appetite. Sleeping okay. She states that she is taking amitriptyline for depression. She is unsure why she is on Flexeril. She is using Voltaren gel and Lidoderm patch for shoulder pain. She states that she uses Lyrica for paresthesias in the right arm and leg that started after her stroke. She returns today for an evaluation.  REVIEW OF SYSTEMS: Out of a complete 14 system review of symptoms, the patient complains only of the following symptoms, and all other reviewed systems are negative.  Constipation, diarrhea, vomiting, excessive thirst, bruise/bleed easily, depression, , drooling  ALLERGIES: Allergies  Allergen Reactions  . Bee Venom Anaphylaxis and Hives  . Beeswax Hives    Bee STINGS only  . Sulfa Antibiotics Hives    HOME MEDICATIONS: Outpatient Medications Prior to Visit  Medication Sig Dispense Refill  . amitriptyline (ELAVIL) 10 MG tablet TAKE 1 TABLET BY MOUTH AT BEDTIME 90 tablet 3  . aspirin EC 81 MG tablet Take 81 mg by mouth daily.    . cyclobenzaprine  (FLEXERIL) 10 MG tablet TAKE 1 TABLET BY MOUTH 3 TIMES DAILY 90 tablet 3  . diclofenac sodium (VOLTAREN) 1 % GEL APPLY TWO GRAMS FOUR TIMES DAILY (Patient taking differently: Apply 4 g topically 4 (four) times daily as needed (FOR PAIN). APPLY TWO GRAMS FOUR TIMES DAILY) 100 g 0  . diclofenac sodium (VOLTAREN) 1 % GEL APPLY 2 grams 4 TIMES DAILY 100 g 0  . ezetimibe (ZETIA) 10 MG tablet Take 10 mg by mouth daily.    . hydrochlorothiazide (MICROZIDE) 12.5 MG capsule Take 12.5 mg by mouth every morning.    . lidocaine (LIDODERM) 5 % Place 1 patch onto the skin daily. Remove & Discard patch within 12 hours or as directed by MD 30 patch 5  . LYRICA 75 MG capsule TAKE 1 CAPSULE BY MOUTH 2 TIMES DAILY 180 capsule 0  . mupirocin ointment (BACTROBAN) 2 % Apply 1 application topically as needed (FOR RASHES).   2  . Oxycodone HCl 10 MG TABS Take 10 mg by mouth every 8 (eight) hours as needed (FOR PAIN).     . ramipril (ALTACE) 5 MG tablet Take 5 mg by mouth 2 (two) times daily. Take one po in the morning and one in the evening.    . rosuvastatin (CRESTOR) 40 MG tablet Take 40 mg by mouth daily.    . sertraline (ZOLOFT) 50 MG tablet TAKE 1 TABLET BY MOUTH EVERY DAY 90 tablet 0   No facility-administered medications prior to visit.     PAST MEDICAL HISTORY: Past Medical History:  Diagnosis Date  . Arthritis    osteoarthritis  . Carotid artery occlusion   . Hyperlipidemia   . Hypertension   . PHN (postherpetic neuralgia) 03/16/2016  . Sjogren's syndrome (HCC)   . Stroke [facility]) 2008   Right hand / Right foot   . Varicose veins    bleeding episode from varicose vein right posterior calf 07-20-2015      PAST SURGICAL HISTORY: Past Surgical History:  Procedure Laterality Date  . ADENOIDECTOMY  1968  . DILATION AND CURETTAGE OF UTERUS    . TONSILLECTOMY  1968    FAMILY HISTORY: Family History  Problem Relation Age of Onset  . Other Mother     varicose veins  . Rheum arthritis Mother   .  Cancer Father   . Hypertension Father   . Multiple sclerosis Sister   . Lymphoma Maternal Aunt   . Leukemia Grandchild     SOCIAL HISTORY: Social History   Social History  . Marital status: Divorced    Spouse name: N/A  . Number of children: 3  . Years of education: college   Occupational History  . Not on file.   Social History Main Topics  . Smoking status: Former Smoker    Types: Cigarettes    Quit date: 08/11/2005  . Smokeless tobacco: Never Used  . Alcohol use 1.2 - 1.8 oz/week    2 - 3 Glasses of wine per week     Comment: occasionally  . Drug use: No  . Sexual activity: Not on file   Other Topics Concern  . Not on file   Social History Narrative   Patient is divorced.   Patient has three children.   Patient does not drink any caffeine.   Patient is right-handed.   Patient has a college education.      PHYSICAL EXAM  Vitals:   06/10/16 1258  BP: 133/83  Pulse: 61  Weight: 188 lb 9.6 oz (85.5 kg)   Body mass index is 34.5 kg/m.   Montreal Cognitive Assessment  04/22/2015  Visuospatial/ Executive (0/5) 3  Naming (0/3) 3  Attention: Read list of digits (0/2) 1  Attention: Read list of letters (0/1) 1  Attention: Serial 7 subtraction starting at 100 (0/3) 2  Language: Repeat phrase (0/2) 2  Language : Fluency (0/1) 0  Abstraction (0/2) 2  Delayed Recall (0/5) 4  Orientation (0/6) 6  Total 24  Adjusted Score (based on education) 24     Generalized: Well developed, in no acute distress   Neurological examination  Mentation: Alert oriented to time, place, history taking. Follows all commands speech and language fluent. MOCA 17/30 Cranial nerve II-XII: Pupils were equal round reactive to light. Extraocular movements were full, visual field were full on confrontational test. Facial sensation and strength were normal. Uvula tongue midline. Head turning and shoulder shrug  were normal and symmetric. Motor: The motor testing reveals 5 over 5 strength  of all 4 extremities. Good symmetric motor tone is noted throughout.  Sensory: Sensory testing is intact to soft touch on all 4 extremities . No evidence of extinction is noted.  Coordination: Cerebellar testing reveals good finger-nose-finger and heel-to-shin bilaterally.  Gait and station: Gait is normal- uses a cane when ambulating. Reflexes: Deep tendon reflexes are symmetric and normal bilaterally.   DIAGNOSTIC DATA (LABS, IMAGING, TESTING) - I reviewed patient records, labs, notes, testing and imaging myself where available.    ASSESSMENT AND PLAN 76 y.o. year old female  has  a past medical history of Arthritis; Carotid artery occlusion; Hyperlipidemia; Hypertension; PHN (postherpetic neuralgia) (03/16/2016); Sjogren's syndrome (HCC); Stroke [facility]) (2008); and Varicose veins. here with:  1. History of stroke 2. Memory disturbance 3. Paresthesias in right upper and lower extremity  The patient is currently on aspirin for stroke prevention. She is encouraged to continue aspirin. Strict control of the blood pressure with goal less than 130/90 and cholesterol LDL less than 70. The patient's memory score has slightly declined since her last visit. MOCA is 17/30 previously 24/30. The patient feels this may be related to the death of her significant other. For now we will continue to monitor. She will return in 4 months for repeat MOCA. The patient is unsure why she is taking Flexeril. We will stop this medication. She will continue on Lyrica for paresthesias in the right upper and lower extremities. She will follow-up in 4 months or sooner if needed.    Butch Penny, MSN, NP-C 06/10/2016, 1:01 PM [facility] [address], [address] [place], [place] [address] [phone]

## 2016-06-10 NOTE — Progress Notes (Signed)
I have read the note, and I agree with the clinical assessment and plan.  WILLIS,CHARLES KEITH   

## 2016-07-06 ENCOUNTER — Ambulatory Visit: Payer: Medicare Other

## 2016-07-09 ENCOUNTER — Ambulatory Visit: Payer: Medicare Other

## 2016-07-17 ENCOUNTER — Telehealth: Payer: Self-pay | Admitting: Podiatry

## 2016-07-17 NOTE — Telephone Encounter (Signed)
Left message informing pt she should not have a pedicure until the area is totally healed, without a scab.

## 2016-07-17 NOTE — Telephone Encounter (Signed)
I had an ingrown toenail removed 6 weeks ago today and I wanted to find out with the time when would be the appropriate time to have a pedicure or if I should wait a lot longer. Please call me at [phone]. I look forward to hearing from you.

## 2016-07-25 ENCOUNTER — Other Ambulatory Visit: Payer: Self-pay | Admitting: Neurology

## 2016-07-27 ENCOUNTER — Ambulatory Visit: Payer: Medicare Other

## 2016-07-28 MED ORDER — PREGABALIN 75 MG PO CAPS: ORAL_CAPSULE | ORAL | 2 refills | Status: DC

## 2016-07-28 NOTE — Addendum Note (Signed)
Addended by: Geronimo RunningINKINS, Kerrington Sova A on: 07/28/2016 01:11 PM   Modules accepted: Orders

## 2016-07-28 NOTE — Telephone Encounter (Signed)
RX for lyrica faxed to [facility]. Received a receipt of confirmation.

## 2016-08-13 ENCOUNTER — Other Ambulatory Visit (HOSPITAL_COMMUNITY): Payer: Self-pay | Admitting: *Deleted

## 2016-08-13 ENCOUNTER — Encounter (HOSPITAL_COMMUNITY): Payer: Self-pay

## 2016-08-13 ENCOUNTER — Encounter (HOSPITAL_COMMUNITY)
Admission: RE | Admit: 2016-08-13 | Discharge: 2016-08-13 | Disposition: A | Discharge status: Home / Self Care | Payer: Medicare Other | Source: Ambulatory Visit | Attending: Orthopedic Surgery | Admitting: Orthopedic Surgery

## 2016-08-13 DIAGNOSIS — I440 Atrioventricular block, first degree: Secondary | ICD-10-CM | POA: Diagnosis not present

## 2016-08-13 DIAGNOSIS — Z01810 Encounter for preprocedural cardiovascular examination: Secondary | ICD-10-CM | POA: Insufficient documentation

## 2016-08-13 DIAGNOSIS — M19011 Primary osteoarthritis, right shoulder: Secondary | ICD-10-CM | POA: Diagnosis not present

## 2016-08-13 DIAGNOSIS — I10 Essential (primary) hypertension: Secondary | ICD-10-CM | POA: Insufficient documentation

## 2016-08-13 HISTORY — DX: Anemia, unspecified: D64.9

## 2016-08-13 HISTORY — DX: Anxiety disorder, unspecified: F41.9

## 2016-08-13 LAB — BASIC METABOLIC PANEL
Anion gap: 8 (ref 5–15)
BUN: 12 mg/dL (ref 6–20)
CHLORIDE: 101 mmol/L (ref 101–111)
CO2: 28 mmol/L (ref 22–32)
Calcium: 9.3 mg/dL (ref 8.9–10.3)
Creatinine, Ser: 0.66 mg/dL (ref 0.44–1.00)
GFR calc Af Amer: >60 mL/min (ref >60)
GFR calc non Af Amer: >60 mL/min (ref >60)
GLUCOSE: 92 mg/dL (ref 65–99)
POTASSIUM: 4.1 mmol/L (ref 3.5–5.1)
Sodium: 137 mmol/L (ref 135–145)

## 2016-08-13 LAB — CBC
HEMATOCRIT: 43.3 % (ref 36.0–46.0)
Hemoglobin: 13.6 g/dL (ref 12.0–15.0)
MCH: 28.8 pg (ref 26.0–34.0)
MCHC: 31.4 g/dL (ref 30.0–36.0)
MCV: 91.7 fL (ref 78.0–100.0)
PLATELETS: 150 10*3/uL (ref 150–400)
RBC: 4.72 MIL/uL (ref 3.87–5.11)
RDW: 14.6 % (ref 11.5–15.5)
WBC: 6.8 10*3/uL (ref 4.0–10.5)

## 2016-08-13 LAB — SURGICAL PCR SCREEN
MRSA, PCR: NEGATIVE
Staphylococcus aureus: NEGATIVE

## 2016-08-13 NOTE — Pre-Procedure Instructions (Signed)
Miranda Harrington  08/13/2016    Your procedure is scheduled on Thursday, August 20, 2016 at 10:15 [place].   [facility] "A" Admitting Office at 8:15 AM.   Call this number if you have problems the morning of surgery: [phone]   Questions prior to day of surgery, please call [phone] between 8 & 4 PM.   Remember:  Do not eat food or drink liquids after midnight Wednesday, 08/19/16.  Take these medicines the morning of surgery with A SIP OF WATER: Pregabalin (Lyrica), Sertraline (Zoloft), Tylenol or Oxycodone - if needed  Stop Aspirin 5 days prior to surgery.   Do not wear jewelry, make-up or nail polish.  Do not wear lotions, powders, perfumes or deodorant.  Do not shave 48 hours prior to surgery.    Do not bring valuables to the hospital.  [facility] is not responsible for any belongings or valuables.  Contacts, dentures or bridgework may not be worn into surgery.  Leave your suitcase in the car.  After surgery it may be brought to your room.  For patients admitted to the hospital, discharge time will be determined by your treatment team.    [facility] - [facility]  Before surgery, you can play an important role.  Because skin is not sterile, your skin needs to be as free of germs as possible.  You can reduce the number of germs on you skin by washing with CHG (chlorahexidine gluconate) soap before surgery.  CHG is an antiseptic cleaner which kills germs and bonds with the skin to continue killing germs even after washing.  Please DO NOT use if you have an allergy to CHG or antibacterial soaps.  If your skin becomes reddened/irritated stop using the CHG and inform your nurse when you arrive at Short Stay.  Do not shave (including legs and underarms) for at least 48 hours prior to the first CHG shower.  You may shave your face.  Please follow these instructions carefully:   1.  Shower with CHG Soap the night before surgery and the morning  of Surgery.  2.  If you choose to wash your hair, wash your hair first as usual with your normal shampoo.  3.  After you shampoo, rinse your hair and body thoroughly to remove the shampoo.  4.  Use CHG as you would any other liquid soap.  You can apply chg directly       to the skin and wash gently with scrungie or a clean washcloth.  5.  Apply the CHG Soap to your body ONLY FROM THE NECK DOWN.        Do not use on open wounds or open sores.  Avoid contact with your eyes, ears, mouth and genitals (private parts).  Wash genitals (private parts) with your normal soap.  6.  Wash thoroughly, paying special attention to the area where your surgery        will be performed.  7.  Thoroughly rinse your body with warm water from the neck down.  8.  DO NOT shower/wash with your normal soap after using and rinsing off       the CHG Soap.  9.  Pat yourself dry with a clean towel.            10.  Wear clean pajamas.            11.  Place clean sheets on your bed the night of your first  shower and do not        sleep with pets.  Day of Surgery  Do not apply any lotions/deodorants the morning of surgery.  Please wear clean clothes to the hospital.    Please read over the fact sheets that you were given.

## 2016-08-13 NOTE — Progress Notes (Signed)
Pt has hx of hemorrhagic stroke in 2008, has slight weakness in right arm and leg. Pt denies cardiac history, chest pain or sob. Pt states she is not diabetic.

## 2016-08-19 MED ORDER — TRANEXAMIC ACID 1000 MG/10ML IV SOLN
1000.0000 mg | INTRAVENOUS | Status: AC
  Administered 2016-08-20: 1000 mg via INTRAVENOUS
  Filled 2016-08-19: qty 10

## 2016-08-19 MED ORDER — CEFAZOLIN SODIUM-DEXTROSE 2-4 GM/100ML-% IV SOLN
2.0000 g | INTRAVENOUS | Status: AC
  Administered 2016-08-20: 2 g via INTRAVENOUS
  Filled 2016-08-19: qty 100

## 2016-08-20 ENCOUNTER — Encounter (HOSPITAL_COMMUNITY): Payer: Self-pay | Admitting: *Deleted

## 2016-08-20 ENCOUNTER — Inpatient Hospital Stay (HOSPITAL_COMMUNITY): Payer: Medicare Other | Admitting: Anesthesiology

## 2016-08-20 ENCOUNTER — Inpatient Hospital Stay (HOSPITAL_COMMUNITY)
Admission: RE | Admit: 2016-08-20 | Discharge: 2016-08-24 | DRG: 483 | Disposition: A | Discharge status: Skilled Nursing Facility | Payer: Medicare Other | Source: Ambulatory Visit | Attending: Orthopedic Surgery | Admitting: Orthopedic Surgery

## 2016-08-20 ENCOUNTER — Encounter (HOSPITAL_COMMUNITY)
Admission: RE | Disposition: A | Discharge status: Skilled Nursing Facility | Payer: Self-pay | Source: Ambulatory Visit | Attending: Orthopedic Surgery

## 2016-08-20 DIAGNOSIS — M19011 Primary osteoarthritis, right shoulder: Secondary | ICD-10-CM | POA: Diagnosis present

## 2016-08-20 DIAGNOSIS — M3500 Sicca syndrome, unspecified: Secondary | ICD-10-CM | POA: Diagnosis present

## 2016-08-20 DIAGNOSIS — Z79899 Other long term (current) drug therapy: Secondary | ICD-10-CM

## 2016-08-20 DIAGNOSIS — Z8673 Personal history of transient ischemic attack (TIA), and cerebral infarction without residual deficits: Secondary | ICD-10-CM | POA: Diagnosis not present

## 2016-08-20 DIAGNOSIS — M75101 Unspecified rotator cuff tear or rupture of right shoulder, not specified as traumatic: Secondary | ICD-10-CM | POA: Diagnosis present

## 2016-08-20 DIAGNOSIS — E785 Hyperlipidemia, unspecified: Secondary | ICD-10-CM | POA: Diagnosis present

## 2016-08-20 DIAGNOSIS — Z87891 Personal history of nicotine dependence: Secondary | ICD-10-CM | POA: Diagnosis not present

## 2016-08-20 DIAGNOSIS — Z7982 Long term (current) use of aspirin: Secondary | ICD-10-CM | POA: Diagnosis not present

## 2016-08-20 DIAGNOSIS — M25511 Pain in right shoulder: Secondary | ICD-10-CM | POA: Diagnosis present

## 2016-08-20 DIAGNOSIS — F419 Anxiety disorder, unspecified: Secondary | ICD-10-CM | POA: Diagnosis present

## 2016-08-20 DIAGNOSIS — Z96619 Presence of unspecified artificial shoulder joint: Secondary | ICD-10-CM

## 2016-08-20 DIAGNOSIS — I10 Essential (primary) hypertension: Secondary | ICD-10-CM | POA: Diagnosis present

## 2016-08-20 HISTORY — PX: REVERSE SHOULDER ARTHROPLASTY: SHX5054

## 2016-08-20 SURGERY — ARTHROPLASTY, SHOULDER, TOTAL, REVERSE
Anesthesia: General | Site: Shoulder | Laterality: Right

## 2016-08-20 MED ORDER — DEXAMETHASONE SODIUM PHOSPHATE 10 MG/ML IJ SOLN
INTRAMUSCULAR | Status: AC
  Filled 2016-08-20: qty 1

## 2016-08-20 MED ORDER — SUGAMMADEX SODIUM 200 MG/2ML IV SOLN
INTRAVENOUS | Status: AC
  Filled 2016-08-20: qty 2

## 2016-08-20 MED ORDER — MENTHOL 3 MG MT LOZG: 1.0000 | LOZENGE | OROMUCOSAL | Status: DC | PRN

## 2016-08-20 MED ORDER — METOCLOPRAMIDE HCL 5 MG PO TABS
5.0000 mg | ORAL_TABLET | Freq: Three times a day (TID) | ORAL | Status: DC | PRN
  Filled 2016-08-20: qty 2

## 2016-08-20 MED ORDER — PROPOFOL 10 MG/ML IV BOLUS
INTRAVENOUS | Status: AC
  Filled 2016-08-20: qty 20

## 2016-08-20 MED ORDER — POLYETHYLENE GLYCOL 3350 17 G PO PACK
17.0000 g | PACK | Freq: Every day | ORAL | Status: DC | PRN
  Administered 2016-08-22 – 2016-08-23 (×2): 17 g via ORAL
  Filled 2016-08-20 (×2): qty 1

## 2016-08-20 MED ORDER — LACTATED RINGERS IV SOLN: INTRAVENOUS | Status: DC | PRN

## 2016-08-20 MED ORDER — LACTATED RINGERS IV SOLN
INTRAVENOUS | Status: DC | PRN
  Administered 2016-08-20: 09:00:00 via INTRAVENOUS

## 2016-08-20 MED ORDER — CHLORHEXIDINE GLUCONATE 4 % EX LIQD: 60.0000 mL | Freq: Once | CUTANEOUS | Status: DC

## 2016-08-20 MED ORDER — ACETAMINOPHEN 650 MG RE SUPP: 650.0000 mg | Freq: Four times a day (QID) | RECTAL | Status: DC | PRN

## 2016-08-20 MED ORDER — LACTATED RINGERS IV SOLN
INTRAVENOUS | Status: DC
  Administered 2016-08-20 – 2016-08-21 (×2): via INTRAVENOUS

## 2016-08-20 MED ORDER — MAGNESIUM CITRATE PO SOLN
1.0000 | Freq: Once | ORAL | Status: AC | PRN
  Administered 2016-08-23: 1 via ORAL
  Filled 2016-08-20: qty 296

## 2016-08-20 MED ORDER — FENTANYL CITRATE (PF) 250 MCG/5ML IJ SOLN
INTRAMUSCULAR | Status: AC
  Filled 2016-08-20: qty 5

## 2016-08-20 MED ORDER — LIDOCAINE HCL (CARDIAC) 20 MG/ML IV SOLN
INTRAVENOUS | Status: DC | PRN
Start: 2016-08-20 — End: 2016-08-20
  Administered 2016-08-20: 20 mg via INTRAVENOUS

## 2016-08-20 MED ORDER — EPHEDRINE SULFATE-NACL 50-0.9 MG/10ML-% IV SOSY
PREFILLED_SYRINGE | INTRAVENOUS | Status: DC | PRN
  Administered 2016-08-20 (×2): 5 mg via INTRAVENOUS

## 2016-08-20 MED ORDER — ONDANSETRON HCL 4 MG PO TABS: 4.0000 mg | ORAL_TABLET | Freq: Four times a day (QID) | ORAL | Status: DC | PRN

## 2016-08-20 MED ORDER — DOCUSATE SODIUM 100 MG PO CAPS
100.0000 mg | ORAL_CAPSULE | Freq: Two times a day (BID) | ORAL | Status: DC
  Administered 2016-08-20 – 2016-08-24 (×8): 100 mg via ORAL
  Filled 2016-08-20 (×8): qty 1

## 2016-08-20 MED ORDER — ALUM & MAG HYDROXIDE-SIMETH 200-200-20 MG/5ML PO SUSP: 30.0000 mL | ORAL | Status: DC | PRN

## 2016-08-20 MED ORDER — BISACODYL 5 MG PO TBEC: 5.0000 mg | DELAYED_RELEASE_TABLET | Freq: Every day | ORAL | Status: DC | PRN

## 2016-08-20 MED ORDER — PROMETHAZINE HCL 25 MG/ML IJ SOLN: 6.2500 mg | INTRAMUSCULAR | Status: DC | PRN

## 2016-08-20 MED ORDER — RAMIPRIL 5 MG PO CAPS
5.0000 mg | ORAL_CAPSULE | Freq: Two times a day (BID) | ORAL | Status: DC
  Administered 2016-08-20 – 2016-08-24 (×7): 5 mg via ORAL
  Filled 2016-08-20 (×9): qty 1

## 2016-08-20 MED ORDER — METOCLOPRAMIDE HCL 5 MG/ML IJ SOLN: 5.0000 mg | Freq: Three times a day (TID) | INTRAMUSCULAR | Status: DC | PRN

## 2016-08-20 MED ORDER — ACETAMINOPHEN 325 MG PO TABS
650.0000 mg | ORAL_TABLET | Freq: Four times a day (QID) | ORAL | Status: DC | PRN
  Administered 2016-08-20 – 2016-08-23 (×3): 650 mg via ORAL
  Filled 2016-08-20 (×3): qty 2

## 2016-08-20 MED ORDER — CEFAZOLIN SODIUM-DEXTROSE 2-4 GM/100ML-% IV SOLN
2.0000 g | Freq: Four times a day (QID) | INTRAVENOUS | Status: AC
  Administered 2016-08-20 – 2016-08-21 (×3): 2 g via INTRAVENOUS
  Filled 2016-08-20 (×3): qty 100

## 2016-08-20 MED ORDER — HYDROCHLOROTHIAZIDE 12.5 MG PO CAPS
12.5000 mg | ORAL_CAPSULE | Freq: Every day | ORAL | Status: DC
  Administered 2016-08-20 – 2016-08-24 (×5): 12.5 mg via ORAL
  Filled 2016-08-20 (×5): qty 1

## 2016-08-20 MED ORDER — ONDANSETRON HCL 4 MG/2ML IJ SOLN
INTRAMUSCULAR | Status: AC
  Filled 2016-08-20: qty 2

## 2016-08-20 MED ORDER — PREGABALIN 75 MG PO CAPS
75.0000 mg | ORAL_CAPSULE | Freq: Two times a day (BID) | ORAL | Status: DC
  Administered 2016-08-20 – 2016-08-24 (×8): 75 mg via ORAL
  Filled 2016-08-20 (×8): qty 1

## 2016-08-20 MED ORDER — 0.9 % SODIUM CHLORIDE (POUR BTL) OPTIME
TOPICAL | Status: DC | PRN
  Administered 2016-08-20: 1000 mL

## 2016-08-20 MED ORDER — ASPIRIN EC 81 MG PO TBEC
81.0000 mg | DELAYED_RELEASE_TABLET | Freq: Every day | ORAL | Status: DC
  Administered 2016-08-21 – 2016-08-24 (×4): 81 mg via ORAL
  Filled 2016-08-20 (×4): qty 1

## 2016-08-20 MED ORDER — SUGAMMADEX SODIUM 200 MG/2ML IV SOLN
INTRAVENOUS | Status: DC | PRN
  Administered 2016-08-20: 200 mg via INTRAVENOUS

## 2016-08-20 MED ORDER — SERTRALINE HCL 50 MG PO TABS
50.0000 mg | ORAL_TABLET | Freq: Every day | ORAL | Status: DC
  Administered 2016-08-21 – 2016-08-24 (×4): 50 mg via ORAL
  Filled 2016-08-20 (×4): qty 1

## 2016-08-20 MED ORDER — AMITRIPTYLINE HCL 10 MG PO TABS
10.0000 mg | ORAL_TABLET | Freq: Every day | ORAL | Status: DC
  Administered 2016-08-20 – 2016-08-23 (×4): 10 mg via ORAL
  Filled 2016-08-20 (×5): qty 1

## 2016-08-20 MED ORDER — KETOROLAC TROMETHAMINE 15 MG/ML IJ SOLN
INTRAMUSCULAR | Status: AC
  Filled 2016-08-20: qty 1

## 2016-08-20 MED ORDER — EZETIMIBE 10 MG PO TABS
10.0000 mg | ORAL_TABLET | Freq: Every day | ORAL | Status: DC
  Administered 2016-08-20 – 2016-08-24 (×5): 10 mg via ORAL
  Filled 2016-08-20 (×5): qty 1

## 2016-08-20 MED ORDER — ROCURONIUM BROMIDE 10 MG/ML (PF) SYRINGE
PREFILLED_SYRINGE | INTRAVENOUS | Status: AC
  Filled 2016-08-20: qty 5

## 2016-08-20 MED ORDER — HYDROMORPHONE HCL 1 MG/ML IJ SOLN: 0.2500 mg | INTRAMUSCULAR | Status: DC | PRN

## 2016-08-20 MED ORDER — BUPIVACAINE-EPINEPHRINE (PF) 0.5% -1:200000 IJ SOLN
INTRAMUSCULAR | Status: DC | PRN
  Administered 2016-08-20: 25 mL via PERINEURAL

## 2016-08-20 MED ORDER — PROPOFOL 10 MG/ML IV BOLUS
INTRAVENOUS | Status: DC | PRN
  Administered 2016-08-20: 100 mg via INTRAVENOUS

## 2016-08-20 MED ORDER — DEXAMETHASONE SODIUM PHOSPHATE 10 MG/ML IJ SOLN
INTRAMUSCULAR | Status: DC | PRN
  Administered 2016-08-20: 10 mg via INTRAVENOUS

## 2016-08-20 MED ORDER — LACTATED RINGERS IV SOLN
Freq: Once | INTRAVENOUS | Status: AC
  Administered 2016-08-20: 09:00:00 via INTRAVENOUS

## 2016-08-20 MED ORDER — KETOROLAC TROMETHAMINE 15 MG/ML IJ SOLN
7.5000 mg | Freq: Four times a day (QID) | INTRAMUSCULAR | Status: AC
  Administered 2016-08-20 – 2016-08-21 (×4): 7.5 mg via INTRAVENOUS
  Filled 2016-08-20 (×3): qty 1

## 2016-08-20 MED ORDER — PHENOL 1.4 % MT LIQD: 1.0000 | OROMUCOSAL | Status: DC | PRN

## 2016-08-20 MED ORDER — ONDANSETRON HCL 4 MG/2ML IJ SOLN
INTRAMUSCULAR | Status: DC | PRN
  Administered 2016-08-20: 4 mg via INTRAVENOUS

## 2016-08-20 MED ORDER — DIPHENHYDRAMINE HCL 12.5 MG/5ML PO ELIX
12.5000 mg | ORAL_SOLUTION | ORAL | Status: DC | PRN
Start: 2016-08-20 — End: 2016-08-24

## 2016-08-20 MED ORDER — PHENYLEPHRINE HCL 10 MG/ML IJ SOLN
INTRAMUSCULAR | Status: DC | PRN
  Administered 2016-08-20: 25 ug/min via INTRAVENOUS

## 2016-08-20 MED ORDER — DIAZEPAM 5 MG PO TABS
5.0000 mg | ORAL_TABLET | Freq: Four times a day (QID) | ORAL | Status: DC | PRN
  Administered 2016-08-24 (×2): 5 mg via ORAL
  Filled 2016-08-20 (×2): qty 1

## 2016-08-20 MED ORDER — ROSUVASTATIN CALCIUM 10 MG PO TABS
40.0000 mg | ORAL_TABLET | Freq: Every day | ORAL | Status: DC
  Administered 2016-08-20 – 2016-08-23 (×4): 40 mg via ORAL
  Filled 2016-08-20 (×4): qty 4

## 2016-08-20 MED ORDER — ONDANSETRON HCL 4 MG/2ML IJ SOLN: 4.0000 mg | Freq: Four times a day (QID) | INTRAMUSCULAR | Status: DC | PRN

## 2016-08-20 MED ORDER — FENTANYL CITRATE (PF) 100 MCG/2ML IJ SOLN
INTRAMUSCULAR | Status: AC
  Administered 2016-08-20: 50 ug via INTRAVENOUS
  Filled 2016-08-20: qty 2

## 2016-08-20 MED ORDER — OXYCODONE HCL 5 MG PO TABS
10.0000 mg | ORAL_TABLET | ORAL | Status: DC | PRN
  Administered 2016-08-20 – 2016-08-21 (×2): 10 mg via ORAL
  Administered 2016-08-21: 15 mg via ORAL
  Administered 2016-08-21 (×2): 10 mg via ORAL
  Administered 2016-08-22 (×3): 15 mg via ORAL
  Administered 2016-08-22 – 2016-08-23 (×2): 10 mg via ORAL
  Administered 2016-08-23: 15 mg via ORAL
  Administered 2016-08-23: 10 mg via ORAL
  Administered 2016-08-24: 15 mg via ORAL
  Administered 2016-08-24: 10 mg via ORAL
  Filled 2016-08-20: qty 3
  Filled 2016-08-20 (×2): qty 2
  Filled 2016-08-20 (×3): qty 3
  Filled 2016-08-20 (×3): qty 2
  Filled 2016-08-20: qty 3
  Filled 2016-08-20 (×3): qty 2
  Filled 2016-08-20: qty 3

## 2016-08-20 MED ORDER — HYDROMORPHONE HCL 1 MG/ML IJ SOLN
1.0000 mg | INTRAMUSCULAR | Status: DC | PRN
  Administered 2016-08-21 – 2016-08-23 (×5): 1 mg via INTRAVENOUS
  Filled 2016-08-20 (×6): qty 1

## 2016-08-20 MED ORDER — ROCURONIUM BROMIDE 100 MG/10ML IV SOLN
INTRAVENOUS | Status: DC | PRN
  Administered 2016-08-20: 50 mg via INTRAVENOUS

## 2016-08-20 MED ORDER — MIDAZOLAM HCL 2 MG/2ML IJ SOLN
INTRAMUSCULAR | Status: AC
  Filled 2016-08-20: qty 2

## 2016-08-20 MED ORDER — LIDOCAINE 2% (20 MG/ML) 5 ML SYRINGE
INTRAMUSCULAR | Status: AC
  Filled 2016-08-20: qty 5

## 2016-08-20 MED ORDER — FENTANYL CITRATE (PF) 100 MCG/2ML IJ SOLN
50.0000 ug | Freq: Once | INTRAMUSCULAR | Status: AC
  Administered 2016-08-20: 50 ug via INTRAVENOUS

## 2016-08-20 MED ORDER — GLYCOPYRROLATE 0.2 MG/ML IJ SOLN
INTRAMUSCULAR | Status: DC | PRN
  Administered 2016-08-20: .2 mg via INTRAVENOUS

## 2016-08-20 SURGICAL SUPPLY — 62 items
BASEPLATE GLENOID SHLDR SM (Shoulder) ×3 IMPLANT
BLADE SAW SGTL 83.5X18.5 (BLADE) ×3 IMPLANT
COVER SURGICAL LIGHT HANDLE (MISCELLANEOUS) ×3 IMPLANT
CUP SUT UNIV REVERS 36+2 RT (Cup) ×3 IMPLANT
DERMABOND ADVANCED (GAUZE/BANDAGES/DRESSINGS) ×2
DERMABOND ADVANCED .7 DNX12 (GAUZE/BANDAGES/DRESSINGS) ×1 IMPLANT
DRAPE ORTHO SPLIT 77X108 STRL (DRAPES) ×4
DRAPE SURG 17X11 SM STRL (DRAPES) ×3 IMPLANT
DRAPE SURG ORHT 6 SPLT 77X108 (DRAPES) ×2 IMPLANT
DRAPE U-SHAPE 47X51 STRL (DRAPES) ×3 IMPLANT
DRSG AQUACEL AG ADV 3.5X10 (GAUZE/BANDAGES/DRESSINGS) ×3 IMPLANT
DURAPREP 26ML APPLICATOR (WOUND CARE) ×3 IMPLANT
ELECT BLADE 4.0 EZ CLEAN MEGAD (MISCELLANEOUS) ×3
ELECT CAUTERY BLADE 6.4 (BLADE) ×3 IMPLANT
ELECT REM PT RETURN 9FT ADLT (ELECTROSURGICAL) ×3
ELECTRODE BLDE 4.0 EZ CLN MEGD (MISCELLANEOUS) ×1 IMPLANT
ELECTRODE REM PT RTRN 9FT ADLT (ELECTROSURGICAL) ×1 IMPLANT
FACESHIELD WRAPAROUND (MASK) ×9 IMPLANT
GLENOSPHERE LATERAL 36MM+4 (Shoulder) ×3 IMPLANT
GLOVE BIO SURGEON STRL SZ7.5 (GLOVE) ×3 IMPLANT
GLOVE BIO SURGEON STRL SZ8 (GLOVE) ×3 IMPLANT
GLOVE EUDERMIC 7 POWDERFREE (GLOVE) ×3 IMPLANT
GLOVE SS BIOGEL STRL SZ 7.5 (GLOVE) ×1 IMPLANT
GLOVE SUPERSENSE BIOGEL SZ 7.5 (GLOVE) ×2
GOWN STRL REUS W/ TWL LRG LVL3 (GOWN DISPOSABLE) ×1 IMPLANT
GOWN STRL REUS W/ TWL XL LVL3 (GOWN DISPOSABLE) ×2 IMPLANT
GOWN STRL REUS W/TWL LRG LVL3 (GOWN DISPOSABLE) ×2
GOWN STRL REUS W/TWL XL LVL3 (GOWN DISPOSABLE) ×4
KIT BASIN OR (CUSTOM PROCEDURE TRAY) ×3 IMPLANT
KIT ROOM TURNOVER OR (KITS) ×3 IMPLANT
LINER HUMERAL 36 +3MM SM (Shoulder) ×3 IMPLANT
MANIFOLD NEPTUNE II (INSTRUMENTS) ×3 IMPLANT
NDL SUT 6 .5 CRC .975X.05 MAYO (NEEDLE) IMPLANT
NEEDLE HYPO 25GX1X1/2 BEV (NEEDLE) IMPLANT
NEEDLE MAYO TAPER (NEEDLE)
NEEDLE TAPERED W/ NITINOL LOOP (MISCELLANEOUS) ×3 IMPLANT
NS IRRIG 1000ML POUR BTL (IV SOLUTION) ×3 IMPLANT
PACK SHOULDER (CUSTOM PROCEDURE TRAY) ×3 IMPLANT
PAD ARMBOARD 7.5X6 YLW CONV (MISCELLANEOUS) ×6 IMPLANT
RESTRAINT HEAD UNIVERSAL NS (MISCELLANEOUS) ×3 IMPLANT
SCREW CENTRAL NONLOCK 25MM (Screw) ×3 IMPLANT
SCREW LOCK GLENOID UNI PERI (Screw) ×3 IMPLANT
SCREW LOCK PERIPHERAL 30MM (Shoulder) ×3 IMPLANT
SET PIN UNIVERSAL REVERSE (SET/KITS/TRAYS/PACK) ×3 IMPLANT
SLING ARM FOAM STRAP LRG (SOFTGOODS) ×3 IMPLANT
SPACER SHLD UNI REV 36 +6 (Shoulder) ×2 IMPLANT
SPACER TI 36/+6MM (Shoulder) ×1 IMPLANT
SPONGE LAP 18X18 X RAY DECT (DISPOSABLE) ×3 IMPLANT
SPONGE LAP 4X18 X RAY DECT (DISPOSABLE) IMPLANT
STEM HUMERAL UNI REVERS SZ6 (Stem) ×3 IMPLANT
SUCTION FRAZIER HANDLE 10FR (MISCELLANEOUS)
SUCTION TUBE FRAZIER 10FR DISP (MISCELLANEOUS) IMPLANT
SUT FIBERWIRE #2 38 T-5 BLUE (SUTURE) ×6
SUT MNCRL AB 3-0 PS2 18 (SUTURE) ×3 IMPLANT
SUT MON AB 2-0 CT1 27 (SUTURE) ×3 IMPLANT
SUT VIC AB 1 CT1 27 (SUTURE) ×2
SUT VIC AB 1 CT1 27XBRD ANBCTR (SUTURE) ×1 IMPLANT
SUTURE FIBERWR #2 38 T-5 BLUE (SUTURE) ×2 IMPLANT
SYR CONTROL 10ML LL (SYRINGE) IMPLANT
TOWEL OR 17X24 6PK STRL BLUE (TOWEL DISPOSABLE) IMPLANT
TOWEL OR 17X26 10 PK STRL BLUE (TOWEL DISPOSABLE) IMPLANT
WATER STERILE IRR 1000ML POUR (IV SOLUTION) IMPLANT

## 2016-08-20 NOTE — Anesthesia Procedure Notes (Signed)
Anesthesia Regional Block: Interscalene brachial plexus block   Pre-Anesthetic Checklist: ,, timeout performed, Correct Patient, Correct Site, Correct Laterality, Correct Procedure, Correct Position, site marked, Risks and benefits discussed,  Surgical consent,  Pre-op evaluation,  At surgeon's request and post-op pain management  Laterality: Right  Prep: chloraprep       Needles:  Injection technique: Single-shot  Needle Type: Echogenic Stimulator Needle     Needle Length: 9cm  Needle Gauge: 21     Additional Needles:   Procedures: ultrasound guided, nerve stimulator,,,,,,   Nerve Stimulator or Paresthesia:  Response: deltoid, 0.5 mA,   Additional Responses:   Narrative:  Start time: 08/20/2016 9:00 AM End time: 08/20/2016 9:12 AM Injection made incrementally with aspirations every 5 mL.  Performed by: Personally  Anesthesiologist: Marcene DuosFITZGERALD, Rita Prom

## 2016-08-20 NOTE — Transfer of Care (Signed)
Immediate Anesthesia Transfer of Care Note  Patient: Miranda Harrington  Procedure(s) Performed: Procedure(s): REVERSE SHOULDER ARTHROPLASTY (Right)  Patient Location: PACU  Anesthesia Type:GA combined with regional for post-op pain  Level of Consciousness: awake, alert  and oriented  Airway & Oxygen Therapy: Patient Spontanous Breathing and Patient connected to nasal cannula oxygen  Post-op Assessment: Report given to RN, Post -op Vital signs reviewed and stable and Patient moving all extremities  Post vital signs: Reviewed and stable  Last Vitals:  Vitals:   08/20/16 0910 08/20/16 0915  BP: 135/66 140/71  Pulse: 67 71  Resp: 13 20  Temp:      Last Pain:  Vitals:   08/20/16 0920  TempSrc:   PainSc: 0-No pain      Patients Stated Pain Goal: 3 (08/20/16 0834)  Complications: No apparent anesthesia complications

## 2016-08-20 NOTE — Anesthesia Procedure Notes (Signed)
Procedure Name: Intubation Date/Time: 08/20/2016 9:46 AM Performed by: Kyung Rudd Pre-anesthesia Checklist: Patient identified, Emergency Drugs available, Suction available and Patient being monitored Patient Re-evaluated:Patient Re-evaluated prior to inductionOxygen Delivery Method: Circle system utilized Preoxygenation: Pre-oxygenation with 100% oxygen Intubation Type: IV induction and Cricoid Pressure applied Ventilation: Mask ventilation without difficulty Laryngoscope Size: Mac and 3 Grade View: Grade I Tube type: Oral Tube size: 7.0 mm Number of attempts: 1 Airway Equipment and Method: Stylet Placement Confirmation: ETT inserted through vocal cords under direct vision,  positive ETCO2 and breath sounds checked- equal and bilateral Secured at: 21 cm Tube secured with: Tape Dental Injury: Teeth and Oropharynx as per pre-operative assessment

## 2016-08-20 NOTE — Anesthesia Preprocedure Evaluation (Addendum)
Anesthesia Evaluation  Patient identified by MRN, date of birth, ID band Patient awake    Reviewed: Allergy & Precautions, NPO status , Patient's Chart, lab work & pertinent test results  Airway Mallampati: I  TM Distance: <3 FB Neck ROM: Full    Dental  (+) Teeth Intact, Dental Advisory Given   Pulmonary former smoker,    breath sounds clear to auscultation       Cardiovascular hypertension, Pt. on medications + Peripheral Vascular Disease   Rhythm:Regular Rate:Normal     Neuro/Psych CVA    GI/Hepatic negative GI ROS, Neg liver ROS,   Endo/Other  negative endocrine ROS  Renal/GU negative Renal ROS     Musculoskeletal  (+) Arthritis ,   Abdominal   Peds  Hematology negative hematology ROS (+)   Anesthesia Other Findings   Reproductive/Obstetrics                            Lab Results  Component Value Date   WBC 6.8 08/13/2016   HGB 13.6 08/13/2016   HCT 43.3 08/13/2016   MCV 91.7 08/13/2016   PLT 150 08/13/2016   Lab Results  Component Value Date   CREATININE 0.66 08/13/2016   BUN 12 08/13/2016   NA 137 08/13/2016   K 4.1 08/13/2016   CL 101 08/13/2016   CO2 28 08/13/2016    Anesthesia Physical Anesthesia Plan  ASA: III  Anesthesia Plan: General   Post-op Pain Management:  Regional for Post-op pain   Induction: Intravenous  PONV Risk Score and Plan: 3 and Ondansetron, Dexamethasone and Propofol  Airway Management Planned: Oral ETT  Additional Equipment:   Intra-op Plan:   Post-operative Plan: Extubation in OR  Informed Consent: I have reviewed the patients History and Physical, chart, labs and discussed the procedure including the risks, benefits and alternatives for the proposed anesthesia with the patient or authorized representative who has indicated his/her understanding and acceptance.   Dental advisory given  Plan Discussed with: Anesthesiologist and  CRNA  Anesthesia Plan Comments:        Anesthesia Quick Evaluation

## 2016-08-20 NOTE — Discharge Instructions (Signed)

## 2016-08-20 NOTE — Op Note (Signed)
NAME:  [name], [name]                    ACCOUNT NO.:  MEDICAL RECORD NO.:  [id number]  LOCATION:                                 FACILITY:  PHYSICIAN:  Vania Rea. Parveen Freehling, M.D.  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  PROCEDURE:  Right shoulder reverse arthroplasty utilizing a press-fit size 6 Arthrex stem, a +6 spacer, +3 polyethylene insert, and a 36, +4 glenosphere and a small baseplate.  SURGEON:  Vania Rea. Madilynn Montante, M.D.  Threasa HeadsFrench Ana A. Shuford, PA-C.  ANESTHESIA:  General endotracheal as well as an interscalene block.  ESTIMATED BLOOD LOSS:  200 mL.  DRAINS:  None.  HISTORY:  Miranda Harrington is a 76 year old female, who has had chronic and progressive increasing right shoulder pain related to end-stage osteoarthritis.  Her preop x-rays show complete obliteration of the joint space, with severe glenoid retroversion and potentially high- riding humeral head.  Her clinical examination shows profound loss of motion, with only 20 degrees of elevation which has been her restricted mobility for some number of years now.  There are some concerns as to the status and functional capabilities of the rotator cuff.  Given the severe retroversion and a profound loss of shoulder motion, it was felt that she would benefit from a reverse shoulder arthroplasty.  Preoperatively, I counseled Ms. Cataldi regarding treatment options and potential risks versus benefits thereof.  Possible surgical complications were all reviewed including bleeding, infection, neurovascular injury, persistent pain, loss of motion, anesthetic complication, failure of the  implant, and possible need for additional surgery.  She understands and accepts and agrees with our planned procedure.  PROCEDURE IN DETAIL:  After undergoing routine preop evaluation, the patient received prophylactic antibiotics and interscalene block was established in the holding area by the Anesthesia Department.  Placed supine on the operating table.  Underwent smooth induction of a general endotracheal anesthesia.  Placed in the beach-chair position and appropriately padded and protected.  Left shoulder girdle region was sterilely prepped and draped in standard fashion.  Time-out was called. An anterior deltopectoral approach to left shoulder was made through an anterior 10 cm incision.  Skin flaps were elevated.  Dissection carried deeply.  Electrocautery was used for hemostasis.  The deltopectoral interval was then developed from proximal to distal vein taken laterally.  The upper centimeter of the pec major was tenotomized to enhance exposure, and then, the conjoined tendon was mobilized and retracted medially. Biceps tendon was intact.  This was tenotomized after being unroofed for later tenodesis.  I then divided the rotator cuff along the rotator interval and then separated the subscapularis from the lesser tuberosity using electrocautery, and the free margin was tagged with a pair of #2 FiberWires grasping sutures.  Superiorly the rotator cuff showed severe attenuation and fraying and appeared of very questionable viability and so, we went ahead and divided the rotator cuff across  the apex of the greater tuberosity as planned per reverse arthroplasty.  We then divided the capsular attachments from the anteroinferior and inferior aspects of the humeral neck allowing delivery of the humeral head through the wound.  We then outlined our proposed humeral head resection using the extramedullary guide.  The humeral head resection was then performed at approximately 30  degrees retroversion with the oscillating saw, and then, a rongeur was used to remove osteophytes.  Metal cap placed with the cut surface of the proximal humerus.  At this point, we then went ahead and exposed the glenoid with combination of Fukuda, pitchfork, and snake tongue retractors.  There was severe glenoid retroversion.  I performed a circumferential labral resection gaining complete visualization of the periphery of the glenoid and very large osteophytes on the anterior margin of the glenoid.  I then placed a guidepin into the center of the glenoid and performed a sequential central followed by peripheral reaming using the appropriate Arthrex reamers and ultimately achieved a stable bony surface that had just a very small area posteriorly that had not been reamed but felt as though at least 75% of the glenoid surface had appropriate bony bed for placement of our baseplate.  At this point, then we impacted the small baseplate.  It was transfixed with a central lag screw followed by inferior and superior locking screws, all of which had excellent fit and fixation.  At this point, we then inserted the 36, +4 glenosphere on the baseplate.  This was impacted showing good stability.  We then returned our attention back to the humerus where I performed a hand reaming up to size 7, broached up to size 6 of native retroversion with excellent fit and fixation, excellent stability.  The posterior offset reaming post was then placed.  We reamed the metaphysis.  At this point, we then placed a trial implant and this showed good soft tissue balance.  The final implant was assembled on the back table, size 6 stem with the posterior offset metaphysis was assembled and then impacted into the humerus with excellent interference, fit, and fixation.  I then performed a series of trial reductions and ultimately felt that a total of 9 mm extension of the baseplate was appropriate.  So, we finally  assembled a +6 extension and a +3 poly and this gave [place] excellent soft tissue balance, good shoulder motion, and good stability.  The final implant was reduced, shoulder taken through range of motion, good stability achieved.  We then repaired the subscapularis back to the anterior margin of the humeral neck and metaphyseal region through the eyelets on the implant in a horizontal mattress fashion with excellent reapposition and no evidence for excessive tension, the arm could easily achieved 30 degrees of external rotation without any tension on the subscapularis repair.  At this point, we performed a biceps tenodesis to the upper border of the pec major using #2 FiberWire.  Wound was irrigated.  Hemostasis was obtained.  The deltopectoral interval was then reapproximated with a series of figure-of-eight #1 Vicryl sutures, 2-0 Monocryl used for subcu layer, intracuticular 3-0 Monocryl for the skin followed by Dermabond and Aquacel dressing.  Right arm was placed in a sling.  The patient was then awakened, extubated, and taken to the recovery room in stable condition.  Ralene Bathe, PA-C, was used as an Geophysicist/field seismologist throughout this case, was essential for help with positioning the patient, positioning the extremity, tissue manipulation or retraction, implantation of the prosthesis, wound  closure, and intraoperative decision making.     Vania ReaKevin M. Chane Cowden, M.D.     KMS/MEDQ  D:  08/20/2016  T:  08/20/2016  Job:  [id number]

## 2016-08-20 NOTE — Op Note (Signed)
08/20/2016  11:30 AM  PATIENT:   Miranda Harrington  76 y.o. female  PRE-OPERATIVE DIAGNOSIS:  Right shoulder osteoarthritis   POST-OPERATIVE DIAGNOSIS:  Same with rotator cuff deficiencey  PROCEDURE:  R RSA #6 stem, +6 spacer, +3 poly, 36/+4 glenosphere  SURGEON:  Thurlow Gallaga, Vania ReaKevin M. M.D.  ASSISTANTS: Shuford pac   ANESTHESIA:   GET + ISB  EBL: 200  SPECIMEN:  none  Drains: none   PATIENT DISPOSITION:  PACU - hemodynamically stable.    PLAN OF CARE: Admit for overnight observation, short term SNF  Dictation# [id number]   Contact # [phone]

## 2016-08-20 NOTE — H&P (Signed)
Miranda Harrington    Chief Complaint: Right shoulder osteoarthritis  HPI: The patient is a 76 y.o. 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 Anemia    after a miscarriage  . Anxiety    after stroke  . Arthritis    osteoarthritis  . Carotid artery occlusion   . Hyperlipidemia   . Hypertension   . PHN (postherpetic neuralgia) 03/16/2016  . Sjogren's syndrome (HCC)   . Stroke [facility](HCC) 2008   Right hand / Right foot  Hemorrhagic stroke  . Varicose veins    bleeding episode from varicose vein right posterior calf 07-20-2015      Past Surgical History:  Procedure Laterality Date  . ADENOIDECTOMY  1968  . DILATION AND CURETTAGE OF UTERUS    . TONSILLECTOMY  1968    Family History  Problem Relation Age of Onset  . Other Mother        varicose veins  . Rheum arthritis Mother   . Cancer Father   . Hypertension Father   . Multiple sclerosis Sister   . Lymphoma Maternal Aunt   . Leukemia Grandchild     Social History:  reports that she quit smoking about 11 years ago. Her smoking use included Cigarettes. She has never used smokeless tobacco. She reports that she drinks alcohol. She reports that she does not use drugs.   Medications Prior to Admission  Medication Sig Dispense Refill  . Acetaminophen (TYLENOL PO) Take 2 tablets by mouth daily as needed (headache).    Marland Kitchen. amitriptyline (ELAVIL) 10 MG tablet TAKE 1 TABLET BY MOUTH AT BEDTIME 90 tablet 3  . antiseptic oral rinse (BIOTENE) LIQD 15 mLs by Mouth Rinse route as needed for dry mouth.    Marland Kitchen. aspirin EC 81 MG tablet Take 81 mg by mouth daily.    . cyclobenzaprine (FLEXERIL) 10 MG tablet TAKE 1 TABLET BY MOUTH 3 TIMES DAILY (Patient taking differently: TAKE 1 TABLET BY MOUTH AT BEDTIME) 90 tablet 3  . ezetimibe (ZETIA) 10 MG tablet Take 10 mg by mouth daily.    . hydrochlorothiazide (MICROZIDE) 12.5 MG capsule Take 12.5 mg by mouth daily.     Marland Kitchen. lidocaine (LIDODERM)  5 % Place 1 patch onto the skin daily. Remove & Discard patch within 12 hours or as directed by MD 30 patch 5  . mupirocin ointment (BACTROBAN) 2 % Apply 1 application topically 2 (two) times daily as needed (FOR RASHES).   2  . oxyCODONE-acetaminophen (PERCOCET) 10-325 MG tablet TAKE 1 TABLET BY MOUTH upto 4 TIMES daily AS NEEDED  0  . Polyethylene Glycol 400 (BLINK TEARS OP) Apply 1 drop to eye 2 (two) times daily as needed (dry eyes).    . pregabalin (LYRICA) 75 MG capsule TAKE 1 CAPSULE BY MOUTH 2 TIMES DAILY (Patient taking differently: Take 75 mg by mouth 2 (two) times daily. ) 180 capsule 2  . ramipril (ALTACE) 5 MG tablet Take 5 mg by mouth 2 (two) times daily.     . rosuvastatin (CRESTOR) 40 MG tablet Take 40 mg by mouth at bedtime.     . sertraline (ZOLOFT) 50 MG tablet TAKE 1 TABLET BY MOUTH EVERY DAY 90 tablet 2  . diclofenac sodium (VOLTAREN) 1 % GEL APPLY TWO GRAMS FOUR TIMES DAILY (Patient not taking: Reported on 08/11/2016) 100 g 0  . diclofenac sodium (VOLTAREN) 1 % GEL APPLY 2 grams 4 TIMES DAILY (Patient not taking:  Reported on 08/11/2016) 100 g 0  . pregabalin (LYRICA) 75 MG capsule TAKE 1 CAPSULE BY MOUTH 2 TIMES DAILY (Patient not taking: Reported on 08/11/2016) 180 capsule 2     Physical Exam: right shoulder with painful and restricted motion as noted at recent office visits  Vitals  Temp:  [98.2 F (36.8 C)] 98.2 F (36.8 C) (06/28 0815) Pulse Rate:  [80] 80 (06/28 0815) Resp:  [18] 18 (06/28 0815) BP: (149)/(76) 149/76 (06/28 0815) SpO2:  [94 %] 94 % (06/28 0815) Weight:  [83.9 kg (185 lb)] 83.9 kg (185 lb) (06/28 0815)  Assessment/Plan  Impression: Right shoulder osteoarthritis   Plan of Action: Procedure(s): REVERSE SHOULDER ARTHROPLASTY  Miranda Harrington 08/20/2016, 9:05 AM Contact # [phone]

## 2016-08-21 ENCOUNTER — Encounter (HOSPITAL_COMMUNITY): Payer: Self-pay | Admitting: Orthopedic Surgery

## 2016-08-21 MED ORDER — OXYCODONE-ACETAMINOPHEN 10-325 MG PO TABS: 1.0000 | ORAL_TABLET | Freq: Four times a day (QID) | ORAL | 0 refills | Status: DC | PRN

## 2016-08-21 NOTE — Progress Notes (Signed)
PT Cancellation Note  Patient Details Name: Ruchel Brandenburger MRN: [id number] DOB: November 23, 1940   Cancelled Treatment:    Reason Eval/Treat Not Completed: Other (comment) OT reports no acute PT needs, and all other needs can be met at next level of care. Will sign off.  Please re-consult if needs change.   Leighton Ruff, PT, DPT  Acute Rehabilitation Services  Pager: [phone]    Rudean Hitt 08/21/2016, 11:18 AM

## 2016-08-21 NOTE — Discharge Summary (Signed)
PATIENT ID:      Miranda Harrington  MRN:     [id number] DOB/AGE:    10-13-1940 / 76 y.o.     DISCHARGE SUMMARY  ADMISSION DATE:    08/20/2016 DISCHARGE DATE:    ADMISSION DIAGNOSIS: Right shoulder osteoarthritis  Past Medical History:  Diagnosis Date  . Anemia    after a miscarriage  . Anxiety    after stroke  . Arthritis    osteoarthritis  . Carotid artery occlusion   . Hyperlipidemia   . Hypertension   . PHN (postherpetic neuralgia) 03/16/2016  . Sjogren's syndrome (HCC)   . Stroke [facility]) 2008   Right hand / Right foot  Hemorrhagic stroke  . Varicose veins    bleeding episode from varicose vein right posterior calf 07-20-2015      DISCHARGE DIAGNOSIS:   Active Problems:   S/p reverse total shoulder arthroplasty   PROCEDURE: Procedure(s): REVERSE SHOULDER ARTHROPLASTY on 08/20/2016  CONSULTS:    HISTORY:  See H&P in chart.  HOSPITAL COURSE:  Miranda Harrington is a 76 y.o. admitted on 08/20/2016 with a diagnosis of Right shoulder osteoarthritis .  They were brought to the operating room on 08/20/2016 and underwent Procedure(s): REVERSE SHOULDER ARTHROPLASTY.    They were given perioperative antibiotics: Anti-infectives    Start     Dose/Rate Route Frequency Ordered Stop   08/20/16 1600  ceFAZolin (ANCEF) IVPB 2g/100 mL premix     2 g 200 mL/hr over 30 Minutes Intravenous Every 6 hours 08/20/16 1401 08/21/16 0646   08/20/16 1000  ceFAZolin (ANCEF) IVPB 2g/100 mL premix     2 g 200 mL/hr over 30 Minutes Intravenous To [facility] 08/19/16 1040 08/20/16 0938    .  Patient underwent the above named procedure and tolerated it well. The following day they were hemodynamically stable and pain was controlled on oral analgesics. They were neurovascularly intact to the operative extremity. OT was ordered and worked with patient per protocol. PT and social work were also consulted for post op care and arrangement of discharge to skilled for short term rehab.      DIAGNOSTIC  STUDIES:  RECENT RADIOGRAPHIC STUDIES :  No results found.  RECENT VITAL SIGNS:  Patient Vitals for the past 24 hrs:  BP Temp Temp src Pulse Resp SpO2 Weight  08/20/16 2048 (!) 146/87 97.9 F (36.6 C) Oral 95 19 95 % -  08/20/16 1557 112/62 97.6 F (36.4 C) Oral (!) 106 19 94 % -  08/20/16 1512 102/84 97.6 F (36.4 C) - 85 19 98 % -  08/20/16 1500 102/86 - - 94 17 97 % -  08/20/16 1445 111/60 - - 92 15 99 % -  08/20/16 1430 115/80 - - 91 (!) 21 93 % -  08/20/16 1405 112/63 - - 91 20 93 % -  08/20/16 1400 - - - 90 17 - -  08/20/16 1349 121/74 - - 88 18 92 % -  08/20/16 1330 110/64 - - 92 19 95 % -  08/20/16 1300 127/63 - - 72 17 93 % -  08/20/16 1230 134/64 - - 80 13 95 % -  08/20/16 1215 (!) 137/59 - - 82 15 94 % -  08/20/16 1205 131/67 - - 85 15 94 % -  08/20/16 1150 132/68 97.6 F (36.4 C) - 98 20 94 % -  08/20/16 0915 140/71 - - 71 20 98 % -  08/20/16 0910 135/66 - - 67 13 98 % -  08/20/16 0905 (!) 142/64 - - 70 11 96 % -  08/20/16 0900 (!) 138/59 - - 73 13 94 % -  08/20/16 0855 131/60 - - 73 14 95 % -  08/20/16 0850 - - - 73 10 95 % -  08/20/16 0815 (!) 149/76 98.2 F (36.8 C) Oral 80 18 94 % 83.9 kg (185 lb)  .  RECENT EKG RESULTS:    Orders placed or performed during the hospital encounter of 08/13/16  . EKG 12 lead  . EKG 12 lead    DISCHARGE INSTRUCTIONS:    DISCHARGE MEDICATIONS:   Allergies as of 08/21/2016      Reactions   Bee Venom Anaphylaxis, Hives   Sulfa Antibiotics Hives   Demerol [meperidine] Other (See Comments)   hallucination      Medication List    TAKE these medications   amitriptyline 10 MG tablet Commonly known as:  ELAVIL TAKE 1 TABLET BY MOUTH AT BEDTIME   antiseptic oral rinse Liqd 15 mLs by Mouth Rinse route as needed for dry mouth.   aspirin EC 81 MG tablet Take 81 mg by mouth daily.   BLINK TEARS OP Apply 1 drop to eye 2 (two) times daily as needed (dry eyes).   cyclobenzaprine 10 MG tablet Commonly known as:   FLEXERIL TAKE 1 TABLET BY MOUTH 3 TIMES DAILY What changed:  See the new instructions.   diclofenac sodium 1 % Gel Commonly known as:  VOLTAREN APPLY TWO GRAMS FOUR TIMES DAILY   diclofenac sodium 1 % Gel Commonly known as:  VOLTAREN APPLY 2 grams 4 TIMES DAILY   ezetimibe 10 MG tablet Commonly known as:  ZETIA Take 10 mg by mouth daily.   hydrochlorothiazide 12.5 MG capsule Commonly known as:  MICROZIDE Take 12.5 mg by mouth daily.   lidocaine 5 % Commonly known as:  LIDODERM Place 1 patch onto the skin daily. Remove & Discard patch within 12 hours or as directed by MD   mupirocin ointment 2 % Commonly known as:  BACTROBAN Apply 1 application topically 2 (two) times daily as needed (FOR RASHES).   oxyCODONE-acetaminophen 10-325 MG tablet Commonly known as:  PERCOCET Take 1 tablet by mouth every 6 (six) hours as needed for pain. What changed:  See the new instructions.   pregabalin 75 MG capsule Commonly known as:  LYRICA TAKE 1 CAPSULE BY MOUTH 2 TIMES DAILY What changed:  Another medication with the same name was changed. Make sure you understand how and when to take each.   pregabalin 75 MG capsule Commonly known as:  LYRICA TAKE 1 CAPSULE BY MOUTH 2 TIMES DAILY What changed:  how much to take  how to take this  when to take this  additional instructions   ramipril 5 MG tablet Commonly known as:  ALTACE Take 5 mg by mouth 2 (two) times daily.   rosuvastatin 40 MG tablet Commonly known as:  CRESTOR Take 40 mg by mouth at bedtime.   sertraline 50 MG tablet Commonly known as:  ZOLOFT TAKE 1 TABLET BY MOUTH EVERY DAY   TYLENOL PO Take 2 tablets by mouth daily as needed (headache).       FOLLOW UP VISIT:   Follow-up Information    Francena Hanly, MD.   Specialty:  Orthopedic Surgery Why:  call to be seen in 10-14 days Contact information: [address] [place] [address] [phone]           DISCHARGE TO:  Skilled  DISPOSITION: Good  DISCHARGE CONDITION:  Good   Will update prior to discharge  Addendum: Pt was kept through the weekend for pain control and for discharge planning. She remained stable through weekend and will proceed with current plan to [facility] for short term management.   [facility],Jiovany Scheffel for Dr. Francena HanlyKevin Supple 08/21/2016, 7:58 AM

## 2016-08-21 NOTE — Anesthesia Postprocedure Evaluation (Signed)
Anesthesia Post Note  Patient: Miranda Harrington  Procedure(s) Performed: Procedure(s) (LRB): REVERSE SHOULDER ARTHROPLASTY (Right)     Patient location during evaluation: PACU Anesthesia Type: General Level of consciousness: awake and alert Pain management: pain level controlled Vital Signs Assessment: post-procedure vital signs reviewed and stable Respiratory status: spontaneous breathing, nonlabored ventilation, respiratory function stable and patient connected to nasal cannula oxygen Cardiovascular status: blood pressure returned to baseline and stable Postop Assessment: no signs of nausea or vomiting Anesthetic complications: no    Last Vitals:  Vitals:   08/20/16 1557 08/20/16 2048  BP: 112/62 (!) 146/87  Pulse: (!) 106 95  Resp: 19 19  Temp: 36.4 C 36.6 C    Last Pain:  Vitals:   08/21/16 0914  TempSrc:   PainSc: 4                  Kennieth RadFitzgerald, Darshay Deupree E

## 2016-08-21 NOTE — Evaluation (Signed)
Occupational Therapy Evaluation Patient Details Name: Miranda Harrington MRN: [id number] DOB: 1940-07-09 Today's Date: 08/21/2016    History of Present Illness 4 female s/p R reverse TSA                    Clinical Impression  Past Medical History:  Diagnosis Date  . Anemia    after a miscarriage  . Anxiety    after stroke  . Arthritis    osteoarthritis  . Carotid artery occlusion   . Hyperlipidemia   . Hypertension   . PHN (postherpetic neuralgia) 03/16/2016  . Sjogren's syndrome (HCC)   . Stroke [facility]) 2008   Right hand / Right foot  Hemorrhagic stroke  . Varicose veins    bleeding episode from varicose vein right posterior calf 07-20-2015      Patient is s/p R reverse TSA surgery resulting in functional limitations due to the deficits listed below (see OT problem list). PTA was living alone with mod I adls. Pt currently demonstrates decr ability due to R UE dominance and requires (A) for all adls.  Patient will benefit from skilled OT acutely to increase independence and safety with ADLS to allow discharge SNF ( [facility] pre arranged d/c planning).     Follow Up Recommendations  SNF [facility] - rearranged d/c plans)    Equipment Recommendations  Other (comment) (defer to venue)    Recommendations for Other Services       Precautions / Restrictions Precautions Precautions: Shoulder Type of Shoulder Precautions: active protocol Shoulder Interventions: Off for dressing/bathing/exercises Precaution Comments: shoulder guidelines protocol handouts x2 Required Braces or Orthoses: Sling Restrictions Weight Bearing Restrictions: Yes RUE Weight Bearing: Non weight bearing      Mobility Bed Mobility Overal bed mobility: Needs Assistance Bed Mobility: Supine to Sit     Supine to sit: Min guard     General bed mobility comments: educated on exiting the bed on the left side to avoid weight bearing on shoulder  Transfers Overall transfer level:  Needs assistance Equipment used: 1 person hand held assist Transfers: Sit to/from Stand Sit to Stand: Min assist         General transfer comment: pt hand held (A) for first time up to chair     Balance Overall balance assessment: Needs assistance Sitting-balance support: No upper extremity supported;Feet supported Sitting balance-Leahy Scale: Good     Standing balance support: Single extremity supported;During functional activity Standing balance-Leahy Scale: Fair                             ADL either performed or assessed with clinical judgement   ADL Overall ADL's : Needs assistance/impaired Eating/Feeding: Minimal assistance;Sitting Eating/Feeding Details (indicate cue type and reason): needs setup but able to use elbow flexino for self feeding  Grooming: Wash/dry hands;Wash/dry face;Minimal assistance;Sitting   Upper Body Bathing: Moderate assistance   Lower Body Bathing: Maximal assistance           Toilet Transfer: Minimal assistance   Toileting- Clothing Manipulation and Hygiene: Moderate assistance       Functional mobility during ADLs: Minimal assistance General ADL Comments: pt lives alone and plans to go to SNF for x2 weeks for support/ recovery  Pt educated on bathing and avoid washing directly on incision. Pt educated to use new wash cloth and towel each day. Pt educated to allow water to run across dressing and not to soak in a tub  at this time.   AAROM  R shoulder 60 degrees x 10 reps supine forward flexion AAROM  R  shoulder 30 degrees x10 reps supine abduction   Handout provided for exercises and demonstrated elbow hand wrist. Education on correct use of the spirometer this session as well     Vision Baseline Vision/History: Wears glasses Wears Glasses: At all times       Perception     Praxis      Pertinent Vitals/Pain       Hand Dominance Right   Extremity/Trunk Assessment Upper Extremity Assessment Upper Extremity  Assessment: RUE deficits/detail   Lower Extremity Assessment Lower Extremity Assessment: RLE deficits/detail RLE Deficits / Details: R tingle residual from CVA ( sensation changes)   Cervical / Trunk Assessment Cervical / Trunk Assessment: Normal   Communication Communication Communication: No difficulties   Cognition Arousal/Alertness: Awake/alert Behavior During Therapy: WFL for tasks assessed/performed Overall Cognitive Status: Within Functional Limits for tasks assessed                                     General Comments       Exercises     Shoulder Instructions      Home Living Family/patient expects to be discharged to:: Private residence Living Arrangements: Alone Available Help at Discharge: Skilled Nursing Facility Type of Home: Skilled Nursing Facility [facility] )                       Home Equipment: Gilmer Mor - single point   Additional Comments: cane in the community from residual CVA in 2008      Prior Functioning/Environment Level of Independence: Independent                 OT Problem List: Decreased strength;Decreased activity tolerance;Impaired balance (sitting and/or standing);Decreased safety awareness;Pain;Decreased knowledge of use of DME or AE;Decreased knowledge of precautions      OT Treatment/Interventions: Self-care/ADL training;Therapeutic exercise;DME and/or AE instruction;Therapeutic activities;Balance training;Patient/family education    OT Goals(Current goals can be found in the care plan section) Acute Rehab OT Goals Patient Stated Goal: to go get rehab before home OT Goal Formulation: With patient Time For Goal Achievement: 09/04/16 Potential to Achieve Goals: Good  OT Frequency: Min 2X/week   Barriers to D/C: Decreased caregiver support          Co-evaluation              AM-PAC PT "6 Clicks" Daily Activity     Outcome Measure Help from another person eating meals?: A Little Help from  another person taking care of personal grooming?: A Little Help from another person toileting, which includes using toliet, bedpan, or urinal?: A Little Help from another person bathing (including washing, rinsing, drying)?: A Little Help from another person to put on and taking off regular upper body clothing?: A Little Help from another person to put on and taking off regular lower body clothing?: A Lot 6 Click Score: 17   End of Session Equipment Utilized During Treatment: Gait belt Nurse Communication: Mobility status;Precautions  Activity Tolerance: Patient tolerated treatment well Patient left: in chair;with call bell/phone within reach  OT Visit Diagnosis: Unsteadiness on feet (R26.81)                Time: 1610-9604 OT Time Calculation (min): 33 min Charges:  OT General Charges $OT Visit: 1 Procedure OT  Evaluation $OT Eval Moderate Complexity: 1 Procedure OT Treatments $Self Care/Home Management : 8-22 mins G-Codes:      Mateo FlowJones, Brynn   OTR/L Pager: [phone] Office: [phone] .   Boone MasterJones, Jerianne Anselmo B 08/21/2016, 11:18 AM

## 2016-08-21 NOTE — Progress Notes (Signed)
Miranda Harrington  MRN: [id number] DOB/Age: Mar 08, 1940 76 y.o. Physician: Jacquelyne BalintKevin Supple,MD Procedure: Procedure(s) (LRB): REVERSE SHOULDER ARTHROPLASTY (Right)     Subjective: Tolerating po pain meds and voiding this am.  Vital Signs Temp:  [97.6 F (36.4 C)-97.9 F (36.6 C)] 97.9 F (36.6 C) (06/28 2048) Pulse Rate:  [72-106] 95 (06/28 2048) Resp:  [13-21] 19 (06/28 2048) BP: (102-146)/(59-87) 146/87 (06/28 2048) SpO2:  [92 %-99 %] 95 % (06/28 2048)  Lab Results No results for input(s): WBC, HGB, HCT, PLT in the last 72 hours. BMET No results for input(s): NA, K, CL, CO2, GLUCOSE, BUN, CREATININE, CALCIUM in the last 72 hours. No results found for: INR   Exam Dressing dry and R UE NVI        Plan Pt in need of short term skilled post op and desires admission [facility]. Will get social work involved. DC meds and a discharge summary in chart which can be updated on discharge. Social work to see along with PT/OT  [facility],Baylei Siebels PA-C  for Dr.Kevin Supple 08/21/2016, 9:42 AM Contact # [phone]

## 2016-08-22 NOTE — Progress Notes (Signed)
Occupational Therapy Treatment Patient Details Name: Miranda Harrington MRN: [id number] DOB: 02/28/1940 Today's Date: 08/22/2016    History of present illness 33 female s/p R reverse TSA   OT comments  Pt. Able to complete bed mobility, toileting tasks, and shoulder ROM this session.  Very motivated for participation in skilled OT.  Will continue to follow acutely    Follow Up Recommendations  SNF    Equipment Recommendations       Recommendations for Other Services      Precautions / Restrictions Precautions Precautions: Shoulder Type of Shoulder Precautions: active protocol Shoulder Interventions: Off for dressing/bathing/exercises Precaution Comments: pt. has protocol handouts provided at previous session Required Braces or Orthoses: Sling Restrictions RUE Weight Bearing: Weight bearing as tolerated       Mobility Bed Mobility Overal bed mobility: Needs Assistance Bed Mobility: Supine to Sit     Supine to sit: Min assist     General bed mobility comments: hob approx. 7 degrees elevation, utilized left bed rail and exited on left side with min a to guide trunk approx. 1/2 way up during transtion into sitting.  no physical assist for B LEs. pt. maintained NWB R UE without cues  Transfers Overall transfer level: Needs assistance Equipment used: None Transfers: Sit to/from [employer] Sit to Stand: Min guard Stand pivot transfers: Min guard            Balance                                           ADL either performed or assessed with clinical judgement   ADL Overall ADL's : Needs assistance/impaired     Grooming: Wash/dry hands;Standing;Min guard                   Toilet Transfer: Min guard;Ambulation Toilet Transfer Details (indicate cue type and reason): cues not to utilize UE support on items that are not safe: ie an open door, soap dishes, towel rods ect. (pt. was trying to hold onto open b.room door, reviewed  the door can move and throw off her balance) Toileting- Clothing Manipulation and Hygiene: Supervision/safety;Sitting/lateral lean       Functional mobility during ADLs: Min guard       Vision       Perception     Praxis      Cognition Arousal/Alertness: Awake/alert Behavior During Therapy: WFL for tasks assessed/performed Overall Cognitive Status: Within Functional Limits for tasks assessed                                          Exercises Shoulder Exercises Shoulder Flexion: Supine;Both;Self ROM;5 reps Shoulder ABduction: 10 reps;Seated;Both;Self ROM   Shoulder Instructions       General Comments      Pertinent Vitals/ Pain       Pain Assessment: No/denies pain  Home Living                                          Prior Functioning/Environment              Frequency  Min 2X/week        Progress Toward Goals  OT Goals(current goals can now be found in the care plan section)  Progress towards OT goals: Progressing toward goals     Plan      Co-evaluation                 AM-PAC PT "6 Clicks" Daily Activity     Outcome Measure   Help from another person eating meals?: A Little Help from another person taking care of personal grooming?: A Little Help from another person toileting, which includes using toliet, bedpan, or urinal?: A Little Help from another person bathing (including washing, rinsing, drying)?: A Little Help from another person to put on and taking off regular upper body clothing?: A Little Help from another person to put on and taking off regular lower body clothing?: A Lot 6 Click Score: 17    End of Session Equipment Utilized During Treatment: Gait belt  OT Visit Diagnosis: Unsteadiness on feet (R26.81)   Activity Tolerance Patient tolerated treatment well   Patient Left in chair;with call bell/phone within reach   Nurse Communication Patient requests pain meds         Time: [id number] OT Time Calculation (min): 27 min  Charges: OT General Charges $OT Visit: 1 Procedure OT Treatments $Therapeutic Activity: 23-37 mins   Robet LeuMorris, Omarii Scalzo Lorraine, COTA/L 08/22/2016, 8:34 AM

## 2016-08-22 NOTE — Progress Notes (Signed)
Orthopedics Progress Note  Subjective: Patient reported severe pain last night.  Better this morning  Objective:  Vitals:   08/22/16 0000 08/22/16 0430  BP: 124/61 (!) 145/59  Pulse: 65   Resp:  20  Temp:  99 F (37.2 C)    General: Awake and alert  Musculoskeletal: right shoulder with aquacel dressing in place, min swelling and not much pain with gentle passive ROM, NVI distally Neurovascularly intact  Lab Results  Component Value Date   WBC 6.8 08/13/2016   HGB 13.6 08/13/2016   HCT 43.3 08/13/2016   MCV 91.7 08/13/2016   PLT 150 08/13/2016       Component Value Date/Time   NA 137 08/13/2016 1406   K 4.1 08/13/2016 1406   CL 101 08/13/2016 1406   CO2 28 08/13/2016 1406   GLUCOSE 92 08/13/2016 1406   BUN 12 08/13/2016 1406   CREATININE 0.66 08/13/2016 1406   CALCIUM 9.3 08/13/2016 1406   GFRNONAA >60 08/13/2016 1406   GFRAA >60 08/13/2016 1406    No results found for: INR, PROTIME  Assessment/Plan: POD #2 s/p Procedure(s): REVERSE SHOULDER ARTHROPLASTY Patient will transfer to [facility] on Monday Continue OT and mobilization  [employer] Met[employer] CompanySteven R. Ranell PatrickNorris, MD 08/22/2016 10:19 AM

## 2016-08-23 MED ORDER — FLEET ENEMA 7-19 GM/118ML RE ENEM: 1.0000 | ENEMA | Freq: Every day | RECTAL | Status: DC | PRN

## 2016-08-23 NOTE — Progress Notes (Signed)
Occupational Therapy Treatment Patient Details Name: Miranda Harrington MRN: [id number] DOB: 1940-10-07 Today's Date: 08/23/2016    History of present illness 19 female s/p R reverse TSA   OT comments  Pt. States pain has been well managed and she is feeling well.  Able to complete indicated ROM/exercises today.  Remains motivated for continued improvement.  Will continue to follow acutely.    Follow Up Recommendations  SNF    Equipment Recommendations       Recommendations for Other Services      Precautions / Restrictions Precautions Precautions: Shoulder Type of Shoulder Precautions: active protocol Shoulder Interventions: Off for dressing/bathing/exercises Precaution Comments: pt. has protocol handouts provided at previous session Required Braces or Orthoses: Sling Restrictions Weight Bearing Restrictions: Yes RUE Weight Bearing: Non weight bearing       Mobility Bed Mobility Overal bed mobility: Modified Independent       Supine to sit: HOB elevated;Modified independent (Device/Increase time)        Transfers                      Balance                                           ADL either performed or assessed with clinical judgement   ADL                                               Vision       Perception     Praxis      Cognition Arousal/Alertness: Awake/alert Behavior During Therapy: WFL for tasks assessed/performed Overall Cognitive Status: Within Functional Limits for tasks assessed                                          Exercises Shoulder Exercises Shoulder Flexion: Both;Self ROM;5 reps;Seated Shoulder ABduction: 10 reps;Seated;Both;Self ROM   Shoulder Instructions       General Comments      Pertinent Vitals/ Pain       Pain Assessment: No/denies pain (some achiness with exercises but states pain has been well managed)  Home Living                                           Prior Functioning/Environment              Frequency  Min 2X/week        Progress Toward Goals  OT Goals(current goals can now be found in the care plan section)  Progress towards OT goals: Progressing toward goals     Plan Discharge plan remains appropriate    Co-evaluation                 AM-PAC PT "6 Clicks" Daily Activity     Outcome Measure   Help from another person eating meals?: A Little Help from another person taking care of personal grooming?: A Little Help from another person toileting, which includes using toliet, bedpan, or urinal?: A Little Help from another person bathing (  including washing, rinsing, drying)?: A Little Help from another person to put on and taking off regular upper body clothing?: A Little Help from another person to put on and taking off regular lower body clothing?: A Lot 6 Click Score: 17    End of Session    OT Visit Diagnosis: Unsteadiness on feet (R26.81)   Activity Tolerance Patient tolerated treatment well   Patient Left in bed;with call bell/phone within reach   Nurse Communication Other (comment) (pt. concerned about moving her bowels)        Time: [id number] OT Time Calculation (min): 19 min  Charges: OT General Charges $OT Visit: 1 Procedure OT Treatments $Therapeutic Exercise: 8-22 mins  Robet LeuMorris, Emme Rosenau Lorraine, COTA/L 08/23/2016, 9:42 AM

## 2016-08-23 NOTE — Progress Notes (Signed)
    Subjective: 3 Days Post-Op Procedure(s) (LRB): REVERSE SHOULDER ARTHROPLASTY (Right) Patient reports pain as 2 on 0-10 scale.   Denies CP or SOB.  Voiding without difficulty. Positive flatus. Objective: Vital signs in last 24 hours: Temp:  [97.8 F (36.6 C)-98.7 F (37.1 C)] 98.7 F (37.1 C) (07/01 0629) Pulse Rate:  [76-84] 80 (07/01 0629) Resp:  [18] 18 (07/01 0629) BP: (108-118)/(51-83) 114/58 (07/01 0629) SpO2:  [90 %-98 %] 90 % (07/01 0629) Weight:  [83.9 kg (184 lb 15.5 oz)] 83.9 kg (184 lb 15.5 oz) (06/30 2108)  Intake/Output from previous day: 06/30 0701 - 07/01 0700 In: 400 [P.O.:400] Out: -  Intake/Output this shift: No intake/output data recorded.  Labs: No results for input(s): HGB in the last 72 hours. No results for input(s): WBC, RBC, HCT, PLT in the last 72 hours. No results for input(s): NA, K, CL, CO2, BUN, CREATININE, GLUCOSE, CALCIUM in the last 72 hours. No results for input(s): LABPT, INR in the last 72 hours.  Physical Exam: Neurologically intact ABD soft Intact pulses distally Incision: dressing C/D/I  Assessment/Plan: 3 Days Post-Op Procedure(s) (LRB): REVERSE SHOULDER ARTHROPLASTY (Right) Advance diet Up with therapy Discharge to SNF tomorrow  Aairah Negrette D for Dr. Venita Lickahari Tung [facility] [phone] 08/23/2016, 8:03 AM

## 2016-08-24 NOTE — Clinical Social Work Placement (Signed)
   CLINICAL SOCIAL WORK PLACEMENT  NOTE  Date:  08/24/2016  Patient Details  Name: Miranda Harrington MRN: [id number] Date of Birth: October 26, 1940  Clinical Social Work is seeking post-discharge placement for this patient at the Skilled  Nursing Facility level of care (*CSW will initial, date and re-position this form in  chart as items are completed):  No (Facility placement pre-arranged)   Patient/family provided with [place] Clinical Social Work Department's list of facilities offering this level of care within the geographic area requested by the patient (or if unable, by the patient's family).  No   Patient/family informed of their freedom to choose among providers that offer the needed level of care, that participate in Medicare, Medicaid or managed care program needed by the patient, have an available bed and are willing to accept the patient.  No   Patient/family informed of [facility]'s ownership interest in [facility] and [facility], as well as of the fact that they are under no obligation to receive care at these facilities.  PASRR submitted to EDS on 08/24/16     PASRR number received on 08/24/16     Existing PASRR number confirmed on       FL2 transmitted to all facilities in geographic area requested by pt/family on 08/24/16     FL2 transmitted to all facilities within larger geographic area on       Patient informed that his/her managed care company has contracts with or will negotiate with certain facilities, including the following:        No   Patient/family informed of bed offers received.  [facility]     Physician recommends and patient chooses bed at      [facility] on 08/24/16.  Patient to be transferred to facility by       Patient family notified on 08/24/16 of transfer.  Name of family member notified:  Ova FreshwaterJennifer Wilson [phone] (Message left) . Daughter contacted  and message left regarding patient's discharge (patient's name not used).   PHYSICIAN       Additional Comment:    _______________________________________________ Cristobal Goldmannrawford, Caylyn Tedeschi Bradley, LCSW 08/24/2016, 6:22 PM

## 2016-08-24 NOTE — NC FL2 (Signed)
[place] MEDICAID FL2 LEVEL OF CARE SCREENING TOOL     IDENTIFICATION  Patient Name: Miranda Harrington Birthdate: 1940-04-08 Sex: female Admission Date (Current Location): 08/20/2016  [facility] and [place] Number:  Producer, television/film/video and Address:  [facility] [place], [address], [place], [place] [address]      Provider Number: [id number]  Attending Physician Name and Address:  Francena Hanly, MD  Relative Name and Phone Number:  Ova Freshwater - daughter; [phone]    Current Level of Care: Hospital Recommended Level of Care: Skilled Nursing Facility [facility] at [facility]) Prior Approval Number:    Date Approved/Denied:   PASRR Number: [id number]   Discharge Plan: SNF ([facility])    Current Diagnoses: Patient Active Problem List   Diagnosis Date Noted  . 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 Numbness and tingling in right hand 03/22/2014  . Weakness-Right Hand and Foot 03/22/2014  . Varicose veins of lower extremities with other complications 08/03/2013  . History of hemorrhagic stroke with residual hemiplegia (HCC) 03/17/2013  . Occlusion and stenosis of carotid artery without mention of cerebral infarction 08/11/2012    Orientation RESPIRATION BLADDER Height & Weight     Self, Time, Situation, Place  Normal Continent Weight: 184 lb 15.5 oz (83.9 kg) Height:  5\' 5"  (165.1 cm)  BEHAVIORAL SYMPTOMS/MOOD NEUROLOGICAL BOWEL NUTRITION STATUS      Continent Diet (Regular)  AMBULATORY STATUS COMMUNICATION OF NEEDS Skin   Extensive Assist Verbally Other (Comment) (Incision right shoulder)                       Personal Care Assistance Level of Assistance  Bathing, Feeding, Dressing Bathing Assistance: Maximum assistance Feeding assistance: Limited assistance (Min assist per OT) Dressing Assistance: Maximum assistance     Functional Limitations Info  Sight, Hearing, Speech  Sight Info: Impaired Hearing Info: Adequate Speech Info: Adequate    SPECIAL CARE FACTORS FREQUENCY  PT (By licensed PT), OT (By licensed OT)       OT Frequency: Evaluated 6/29 and a minimum of 2X per week therapy recommended            Contractures Contractures Info: Not present    Additional Factors Info  Code Status, Allergies Code Status Info: Full Allergies Info: Bee Venom Sulfa Antibiotics, Demerol           Current Medications (08/24/2016):  This is the current hospital active medication list Current Facility-Administered Medications  Medication Dose Route Frequency Provider Last Rate Last Dose  . acetaminophen (TYLENOL) tablet 650 mg  650 mg Oral Q6H PRN Shuford, French Ana, PA-C   650 mg at 08/23/16 2841   Or  . acetaminophen (TYLENOL) suppository 650 mg  650 mg Rectal Q6H PRN Shuford, Tracy, PA-C      . alum & mag hydroxide-simeth (MAALOX/MYLANTA) 200-200-20 MG/5ML suspension 30 mL  30 mL Oral Q4H PRN Shuford, Tracy, PA-C      . amitriptyline (ELAVIL) tablet 10 mg  10 mg Oral QHS Shuford, Tracy, PA-C   10 mg at 08/23/16 2146  . aspirin EC tablet 81 mg  81 mg Oral Daily Shuford, Tracy, PA-C   81 mg at 08/24/16 3244  . bisacodyl (DULCOLAX) EC tablet 5 mg  5 mg Oral Daily PRN Shuford, Tracy, PA-C      . diazepam (VALIUM) tablet 5 mg  5 mg Oral Q6H PRN Shuford, Tracy, PA-C   5 mg at  08/24/16 0839  . diphenhydrAMINE (BENADRYL) 12.5 MG/5ML elixir 12.5-25 mg  12.5-25 mg Oral Q4H PRN Shuford, Tracy, PA-C      . docusate sodium (COLACE) capsule 100 mg  100 mg Oral BID Shuford, Tracy, PA-C   100 mg at 08/24/16 0839  . ezetimibe (ZETIA) tablet 10 mg  10 mg Oral Daily Shuford, Tracy, PA-C   10 mg at 08/24/16 0840  . hydrochlorothiazide (MICROZIDE) capsule 12.5 mg  12.5 mg Oral Daily Shuford, Tracy, PA-C   12.5 mg at 08/24/16 0839  . HYDROmorphone (DILAUDID) injection 1 mg  1 mg Intravenous Q2H PRN Shuford, Tracy, PA-C   1 mg at 08/23/16 2022  . menthol-cetylpyridinium (CEPACOL)  lozenge 3 mg  1 lozenge Oral PRN Shuford, French Anaracy, PA-C       Or  . phenol (CHLORASEPTIC) mouth spray 1 spray  1 spray Mouth/Throat PRN Shuford, French Anaracy, PA-C      . metoCLOPramide (REGLAN) tablet 5-10 mg  5-10 mg Oral Q8H PRN Shuford, Tracy, PA-C       Or  . metoCLOPramide (REGLAN) injection 5-10 mg  5-10 mg Intravenous Q8H PRN Shuford, Tracy, PA-C      . ondansetron (ZOFRAN) tablet 4 mg  4 mg Oral Q6H PRN Shuford, Tracy, PA-C       Or  . ondansetron (ZOFRAN) injection 4 mg  4 mg Intravenous Q6H PRN Shuford, Tracy, PA-C      . oxyCODONE (Oxy IR/ROXICODONE) immediate release tablet 10-15 mg  10-15 mg Oral Q3H PRN Shuford, Tracy, PA-C   15 mg at 08/24/16 1200  . polyethylene glycol (MIRALAX / GLYCOLAX) packet 17 g  17 g Oral Daily PRN Shuford, Tracy, PA-C   17 g at 08/23/16 1004  . pregabalin (LYRICA) capsule 75 mg  75 mg Oral BID Shuford, Tracy, PA-C   75 mg at 08/24/16 0839  . ramipril (ALTACE) capsule 5 mg  5 mg Oral BID Shuford, Tracy, PA-C   5 mg at 08/24/16 0839  . rosuvastatin (CRESTOR) tablet 40 mg  40 mg Oral QHS Shuford, Tracy, PA-C   40 mg at 08/23/16 2145  . sertraline (ZOLOFT) tablet 50 mg  50 mg Oral Daily Shuford, Tracy, PA-C   50 mg at 08/24/16 [id number]  . sodium phosphate (FLEET) 7-19 GM/118ML enema 1 enema  1 enema Rectal Daily PRN Francena HanlySupple, Kevin, MD         Discharge Medications: Please see discharge summary for a list of discharge medications.  Relevant Imaging Results:  Relevant Lab Results:   Additional Information ss#[id number].  Cristobal Goldmannrawford, Alfred Eckley Bradley, LCSW

## 2016-08-24 NOTE — Progress Notes (Signed)
Report called to [facility] admitting RN.

## 2016-08-24 NOTE — Progress Notes (Signed)
Spoke to TongaVanessa, Child psychotherapistsocial worker, this morning after progression rounds and after speaking with French Anaracy, [place] in regards to patient needing FL2 and was ready for [facility]. Social work said she will work on it.

## 2016-08-24 NOTE — Clinical Social Work Note (Signed)
Ms. Sharol HarnessSimmons medically stable for [facility] today. Admissions director Whitney informed and discharge clinicals transmitted to facility. Patient will be transported by ambulance to facility.  Nurse will call report to facility - [phone].  Genelle BalVanessa Kameah Rawl, MSW, LCSW Licensed Clinical Social Worker Clinical Social Work Department [facility] [phone]

## 2016-08-24 NOTE — Progress Notes (Signed)
Blood pressure (!) 105/49, pulse 98, temperature 98.9 F (37.2 C), temperature source Oral, resp. rate 18, height 5\' 5"  (1.651 m), weight 83.9 kg (184 lb 15.5 oz), SpO2 93 %.  Pt sent via stretcher. All belongings sent with patient.

## 2016-08-24 NOTE — Clinical Social Work Note (Signed)
Clinical Social Work Assessment  Patient Details  Name: Miranda CoupeDiane Roback MRN: [id number] Date of Birth: [birth year]  Date of referral:  08/24/16               Reason for consult:  Facility Placement                Permission sought to share information with:  Family Supports Permission granted to share information::     Name::     Ova FreshwaterJennifer Wilson  Agency::     Relationship::  Daughter  Contact Information:  [phone]  Housing/Transportation Living arrangements for the past 2 months:  Single Family Home Source of Information:  Other (Comment Required), Patient Patient Interpreter Needed:  None Criminal Activity/Legal Involvement Pertinent to Current Situation/Hospitalization:  No - Comment as needed Significant Relationships:  Adult Children Lives with:  Self Do you feel safe going back to the place where you live?  No (Patient agreeable to SNF for ST rehab) Need for family participation in patient care:  Yes (Comment)  Care giving concerns: Patient's procedure - shoulder arthroplasty and admission to [facility] was pre-arranged as patient was aware that she would need rehab after hospitalization.  Social Worker assessment / plan: Patient confirmed with CSW admission to [facility] at discharge from hospital. CSW talked with Alphonzo LemmingsWhitney, admissions director at [facility] and was informed that admission had been pre-arranged.   Employment status:  Retired Engineer, miningnsurance information:  Medicare, Managed Care PT Recommendations:  Skilled Nursing Facility Information / Referral to community resources:  Skilled Nursing Facility (None needed as facility pre-selected prior to hospital admission)  Patient/Family's Response to care: No concerns expressed regarding care during hospitalization.  Patient/Family's Understanding of and Emotional Response to Diagnosis, Current Treatment, and Prognosis:  Not discussed.  Emotional Assessment Appearance:  Appears stated age Attitude/Demeanor/Rapport:  Other  (Appropriate) Affect (typically observed):  Appropriate Orientation:  Oriented to Self, Oriented to Place, Oriented to  Time, Oriented to Situation Alcohol / Substance use:  Tobacco Use, Alcohol Use, Illicit Drugs (Patient reported that she quit smoking 11 years ago, drinks alcohol and does not use illicit drugs) Psych involvement (Current and /or in the community):  No (Comment)  Discharge Needs  Concerns to be addressed:  Discharge Planning Concerns Readmission within the last 30 days:  No Current discharge risk:  None Barriers to Discharge:  No Barriers Identified   Cristobal GoldmannCrawford, Brieana Shimmin Bradley, LCSW 08/24/2016, 6:14 PM

## 2016-08-24 NOTE — Progress Notes (Signed)
Addendum added to discharge summary.  Plan is to [facility] today.

## 2016-10-15 ENCOUNTER — Ambulatory Visit: Payer: Medicare Other | Admitting: Adult Health

## 2016-10-17 ENCOUNTER — Other Ambulatory Visit: Payer: Self-pay | Admitting: Neurology

## 2016-10-20 ENCOUNTER — Ambulatory Visit: Payer: Medicare Other

## 2016-10-21 ENCOUNTER — Other Ambulatory Visit: Payer: Self-pay | Admitting: Neurology

## 2016-11-03 ENCOUNTER — Ambulatory Visit: Payer: Medicare Other | Admitting: Podiatry

## 2016-11-20 ENCOUNTER — Ambulatory Visit
Admission: RE | Admit: 2016-11-20 | Discharge: 2016-11-20 | Disposition: A | Discharge status: Home / Self Care | Payer: Medicare Other | Source: Ambulatory Visit | Attending: Family Medicine | Admitting: Family Medicine

## 2016-11-20 DIAGNOSIS — Z1231 Encounter for screening mammogram for malignant neoplasm of breast: Secondary | ICD-10-CM

## 2016-12-07 ENCOUNTER — Other Ambulatory Visit: Payer: Self-pay | Admitting: Neurology

## 2016-12-14 ENCOUNTER — Ambulatory Visit: Payer: Medicare Other | Admitting: Podiatry

## 2016-12-16 ENCOUNTER — Ambulatory Visit (INDEPENDENT_AMBULATORY_CARE_PROVIDER_SITE_OTHER): Payer: Medicare Other | Admitting: Podiatry

## 2016-12-16 DIAGNOSIS — M722 Plantar fascial fibromatosis: Secondary | ICD-10-CM

## 2016-12-16 MED ORDER — TRIAMCINOLONE ACETONIDE 10 MG/ML IJ SUSP: 10.0000 mg | Freq: Once | INTRAMUSCULAR | Status: DC

## 2016-12-18 NOTE — Progress Notes (Signed)
Subjective:    Patient ID: Miranda Harrington, female   DOB: 76 y.o.   MRN: [id number]   HPI patient states I developed reoccurrence of discomfort in the plantar aspect left heel    ROS      Objective:  Physical Exam neurovascular status intact with inflammation pain in the plantar heel left     Assessment:    Plantar fasciitis left present     Plan:    Injected the left plantar fashion 3 mg Kenalog 5 mill grams Xylocaine instructed on physical therapy shoe gear modification reappoint to recheck

## 2016-12-21 ENCOUNTER — Ambulatory Visit: Payer: Medicare Other | Admitting: Podiatry

## 2016-12-30 ENCOUNTER — Ambulatory Visit (INDEPENDENT_AMBULATORY_CARE_PROVIDER_SITE_OTHER): Payer: Medicare Other | Admitting: Podiatry

## 2016-12-30 ENCOUNTER — Encounter: Payer: Self-pay | Admitting: Podiatry

## 2016-12-30 VITALS — BP 133/70 | HR 58 | Resp 16

## 2016-12-30 DIAGNOSIS — M722 Plantar fascial fibromatosis: Secondary | ICD-10-CM

## 2016-12-30 MED ORDER — TRIAMCINOLONE ACETONIDE 10 MG/ML IJ SUSP
10.0000 mg | Freq: Once | INTRAMUSCULAR | Status: AC
  Administered 2016-12-30: 10 mg

## 2016-12-30 NOTE — Progress Notes (Signed)
Subjective:    Patient ID: Miranda Harrington, female   DOB: 76 y.o.   MRN: [id number]   HPI patient states still having pain in her heel that's getting gradually worse and it's worse when she gets up in the morning and after periods of sitting    ROS      Objective:  Physical Exam neurovascular status intact with exquisite discomfort plantar aspect heel region left at the insertional point with a tight plantar fashion noted     Assessment:  Acute plantar fasciitis with inflammation of the tendon       Plan:    H&P condition reviewed and injected the plantar fascia today 3 mg Kenalog 5 mg Xylocaine and dispensed night splint with all instructions on usage. Reappoint for us to recheck again and may require shockwave or other treatment if symptoms persist

## 2017-02-06 ENCOUNTER — Other Ambulatory Visit: Payer: Self-pay | Admitting: Neurology

## 2017-03-08 ENCOUNTER — Other Ambulatory Visit: Payer: Self-pay | Admitting: Neurology

## 2017-03-15 ENCOUNTER — Ambulatory Visit: Payer: Medicare Other | Admitting: Podiatry

## 2017-03-24 DIAGNOSIS — H2513 Age-related nuclear cataract, bilateral: Secondary | ICD-10-CM | POA: Insufficient documentation

## 2017-05-11 ENCOUNTER — Other Ambulatory Visit: Payer: Self-pay | Admitting: Neurology

## 2017-06-04 ENCOUNTER — Other Ambulatory Visit: Payer: Self-pay | Admitting: Internal Medicine

## 2017-06-04 DIAGNOSIS — M810 Age-related osteoporosis without current pathological fracture: Secondary | ICD-10-CM

## 2017-06-04 DIAGNOSIS — Z1231 Encounter for screening mammogram for malignant neoplasm of breast: Secondary | ICD-10-CM

## 2017-06-10 ENCOUNTER — Ambulatory Visit (INDEPENDENT_AMBULATORY_CARE_PROVIDER_SITE_OTHER): Payer: Medicare Other | Admitting: Adult Health

## 2017-06-10 ENCOUNTER — Encounter: Payer: Self-pay | Admitting: Adult Health

## 2017-06-10 VITALS — BP 123/79 | HR 68 | Ht 62.0 in | Wt 185.0 lb

## 2017-06-10 DIAGNOSIS — R202 Paresthesia of skin: Secondary | ICD-10-CM | POA: Diagnosis not present

## 2017-06-10 DIAGNOSIS — R413 Other amnesia: Secondary | ICD-10-CM | POA: Diagnosis not present

## 2017-06-10 DIAGNOSIS — Z8673 Personal history of transient ischemic attack (TIA), and cerebral infarction without residual deficits: Secondary | ICD-10-CM | POA: Diagnosis not present

## 2017-06-10 MED ORDER — PREGABALIN 75 MG PO CAPS: ORAL_CAPSULE | ORAL | 0 refills | Status: AC

## 2017-06-10 MED ORDER — SERTRALINE HCL 50 MG PO TABS: 50.0000 mg | ORAL_TABLET | Freq: Every day | ORAL | 11 refills | Status: DC

## 2017-06-10 NOTE — Patient Instructions (Signed)
Your Plan:  Continue Lyrica Continue Zoloft Wean off Amitritpyline- take every other night for 1 week, then 1 tablet every two days for 1 week- then stop medication If your symptoms worsen or you develop new symptoms please let us know.   Thank you for coming to [facility] at [facility]. I hope we have been able to provide you high quality care today.  You may receive a patient satisfaction survey over the next few weeks. We would appreciate your feedback and comments so that we may continue to improve ourselves and the health of our patients.

## 2017-06-10 NOTE — Progress Notes (Signed)
I have read the note, and I agree with the clinical assessment and plan.  Annisha Baar K Vanessa Alesi   

## 2017-06-10 NOTE — Progress Notes (Signed)
PATIENT: Miranda Harrington DOB: 01-27-1941  REASON FOR VISIT: follow up HISTORY FROM: patient  HISTORY OF PRESENT ILLNESS: Today 06/10/17   Miranda Harrington is a 77 year old female with a history of stroke, memory disturbance and paresthesias on the right side.  She returns today for follow-up.  She continues on Lyrica and reports that this helps with paresthesias.  She states that she came to have a flareup when there are weather changes.  She continues on aspirin daily.  Blood pressure is in normal range today.  The patient continues to use Voltaren gel as needed on the right shoulder.  She continues on Zoloft and amitriptyline.  She is unsure why she is taking amitriptyline.  She would like to discontinue this medication if possible.  She returns today for an evaluation.  HISTORY 06/10/16: Miranda Harrington is a 77 year old female with a history of stroke, memory disturbance and paresthesias on the right. She returns today for follow-up. She is currently on aspirin for stroke prevention. Blood pressure is in normal range. She reports that her primary care is managing her cholesterol. She states that she has noticed some changes with her memory. Although she reports that her significant other passed away in 08-Oct-2022. She states that she still cries at least once a day over the death. She lives at home alone although she does have family here locally. She is able to complete all ADLs independently. She has not operated a motor vehicle since her stroke. She is able to manage her own finances as well as cook her own meals. Reports good appetite. Sleeping okay. She states that she is taking amitriptyline for depression. She is unsure why she is on Flexeril. She is using Voltaren gel and Lidoderm patch for shoulder pain. She states that she uses Lyrica for paresthesias in the right arm and leg that started after her stroke. She returns today for an evaluation.    REVIEW OF SYSTEMS: Out of a complete 14 system review  of symptoms, the patient complains only of the following symptoms, and all other reviewed systems are negative.  Loss of vision, runny nose, leg swelling, joint pain, joint swelling, aching muscles, weakness  ALLERGIES: Allergies  Allergen Reactions  . Bee Venom Anaphylaxis and Hives  . Sulfa Antibiotics Hives  . Demerol [Meperidine] Other (See Comments)    hallucination    HOME MEDICATIONS: Outpatient Medications Prior to Visit  Medication Sig Dispense Refill  . Acetaminophen (TYLENOL PO) Take 2 tablets by mouth daily as needed (headache).    Marland Kitchen amitriptyline (ELAVIL) 10 MG tablet TAKE 1 TABLET BY MOUTH AT BEDTIME 90 tablet 0  . antiseptic oral rinse (BIOTENE) LIQD 15 mLs by Mouth Rinse route as needed for dry mouth.    Marland Kitchen aspirin EC 81 MG tablet Take 81 mg by mouth daily.    . diclofenac sodium (VOLTAREN) 1 % GEL APPLY TWO GRAMS FOUR TIMES DAILY 100 g 0  . diclofenac sodium (VOLTAREN) 1 % GEL APPLY 2 grams 4 TIMES DAILY 100 g 0  . ezetimibe (ZETIA) 10 MG tablet Take 10 mg by mouth daily.    . hydrochlorothiazide (MICROZIDE) 12.5 MG capsule Take 12.5 mg by mouth daily.     Marland Kitchen lidocaine (LIDODERM) 5 % Place 1 patch onto the skin daily. Remove & Discard patch within 12 hours or as directed by MD 30 patch 5  . LYRICA 75 MG capsule TAKE 1 CAPSULE BY MOUTH 2 TIMES DAILY 180 capsule 0  . mupirocin ointment (BACTROBAN)  2 % Apply 1 application topically 2 (two) times daily as needed (FOR RASHES).   2  . Polyethylene Glycol 400 (BLINK TEARS OP) Apply 1 drop to eye 2 (two) times daily as needed (dry eyes).    . ramipril (ALTACE) 5 MG tablet Take 5 mg by mouth 2 (two) times daily.     . rosuvastatin (CRESTOR) 40 MG tablet Take 40 mg by mouth at bedtime.     . sertraline (ZOLOFT) 50 MG tablet TAKE 1 TABLET BY MOUTH EVERY DAY 90 tablet 0   Facility-Administered Medications Prior to Visit  Medication Dose Route Frequency Provider Last Rate Last Dose  . triamcinolone acetonide (KENALOG) 10 MG/ML  injection 10 mg  10 mg Other Once Lenn Sink, DPM        PAST MEDICAL HISTORY: Past Medical History:  Diagnosis Date  . Anemia    after a miscarriage  . Anxiety    after stroke  . Arthritis    osteoarthritis  . Carotid artery occlusion   . Hyperlipidemia   . Hypertension   . PHN (postherpetic neuralgia) 03/16/2016  . Sjogren's syndrome (HCC)   . Stroke [facility]) 2008   Right hand / Right foot  Hemorrhagic stroke  . Varicose veins    bleeding episode from varicose vein right posterior calf 07-20-2015      PAST SURGICAL HISTORY: Past Surgical History:  Procedure Laterality Date  . ADENOIDECTOMY  1968  . DILATION AND CURETTAGE OF UTERUS    . REVERSE SHOULDER ARTHROPLASTY Right 08/20/2016  . REVERSE SHOULDER ARTHROPLASTY Right 08/20/2016   Procedure: REVERSE SHOULDER ARTHROPLASTY;  Surgeon: Francena Hanly, MD;  Location: [facility] OR;  Service: Orthopedics;  Laterality: Right;  . TONSILLECTOMY  1968    FAMILY HISTORY: Family History  Problem Relation Age of Onset  . Other Mother        varicose veins  . Rheum arthritis Mother   . Cancer Father   . Hypertension Father   . Multiple sclerosis Sister   . Lymphoma Maternal Aunt   . Leukemia Grandchild     SOCIAL HISTORY: Social History   Socioeconomic History  . Marital status: Divorced    Spouse name: Not on file  . Number of children: 3  . Years of education: college  . Highest education level: Not on file  Occupational History  . Not on file  Social Needs  . Financial resource strain: Not on file  . Food insecurity:    Worry: Not on file    Inability: Not on file  . Transportation needs:    Medical: Not on file    Non-medical: Not on file  Tobacco Use  . Smoking status: Former Smoker    Types: Cigarettes    Last attempt to quit: 08/11/2005    Years since quitting: 11.8  . Smokeless tobacco: Never Used  Substance and Sexual Activity  . Alcohol use: Yes    Comment: rare  . Drug use: No  . Sexual activity:  Not on file  Lifestyle  . Physical activity:    Days per week: Not on file    Minutes per session: Not on file  . Stress: Not on file  Relationships  . Social connections:    Talks on phone: Not on file    Gets together: Not on file    Attends religious service: Not on file    Active member of club or organization: Not on file    Attends meetings of clubs or organizations:  Not on file    Relationship status: Not on file  . Intimate partner violence:    Fear of current or ex partner: Not on file    Emotionally abused: Not on file    Physically abused: Not on file    Forced sexual activity: Not on file  Other Topics Concern  . Not on file  Social History Narrative   Patient is divorced.   Patient has three children.   Patient does not drink any caffeine.   Patient is right-handed.   Patient has a college education.      PHYSICAL EXAM  Vitals:   06/10/17 1357  BP: 123/79  Pulse: 68  Weight: 185 lb (83.9 kg)  Height: 5\' 2"  (1.575 m)   Body mass index is 33.84 kg/m.   Montreal Cognitive Assessment  06/10/2017 04/22/2015  Visuospatial/ Executive (0/5) 4 3  Naming (0/3) 2 3  Attention: Read list of digits (0/2) 2 1  Attention: Read list of letters (0/1) 1 1  Attention: Serial 7 subtraction starting at 100 (0/3) 2 2  Language: Repeat phrase (0/2) 2 2  Language : Fluency (0/1) 0 0  Abstraction (0/2) 2 2  Delayed Recall (0/5) 5 4  Orientation (0/6) 6 6  Total 26 24  Adjusted Score (based on education) 26 24    Generalized: Well developed, in no acute distress   Neurological examination  Mentation: Alert oriented to time, place, history taking. Follows all commands speech and language fluent Cranial nerve II-XII: Pupils were equal round reactive to light. Extraocular movements were full, visual field were full on confrontational test. Facial sensation and strength were normal. Uvula tongue midline. Head turning and shoulder shrug  were normal and symmetric. Motor:  The motor testing reveals 5 over 5 strength of all 4 extremities. Good symmetric motor tone is noted throughout.  Sensory: Sensory testing is intact to soft touch on all 4 extremities. No evidence of extinction is noted.  Coordination: Cerebellar testing reveals good finger-nose-finger and heel-to-shin bilaterally.  Gait and station: Gait is normal.  Reflexes: Deep tendon reflexes are symmetric and normal bilaterally.   DIAGNOSTIC DATA (LABS, IMAGING, TESTING) - I reviewed patient records, labs, notes, testing and imaging myself where available.  Lab Results  Component Value Date   WBC 6.8 08/13/2016   HGB 13.6 08/13/2016   HCT 43.3 08/13/2016   MCV 91.7 08/13/2016   PLT 150 08/13/2016      Component Value Date/Time   NA 137 08/13/2016 1406   K 4.1 08/13/2016 1406   CL 101 08/13/2016 1406   CO2 28 08/13/2016 1406   GLUCOSE 92 08/13/2016 1406   BUN 12 08/13/2016 1406   CREATININE 0.66 08/13/2016 1406   CALCIUM 9.3 08/13/2016 1406   GFRNONAA >60 08/13/2016 1406   GFRAA >60 08/13/2016 1406      ASSESSMENT AND PLAN 77 y.o. year old female  has a past medical history of Anemia, Anxiety, Arthritis, Carotid artery occlusion, Hyperlipidemia, Hypertension, PHN (postherpetic neuralgia) (03/16/2016), Sjogren's syndrome (HCC), Stroke (HCC) (2008), and Varicose veins. here with:  1.  History of stroke 2.  Paresthesias on the right side 3.  Memory disturbance  The patient will continue on aspirin for stroke prevention.  She is encouraged to keep strict control of her blood pressure with goal less than 130/90 and cholesterol LDL less than 70.  She will continue on Lyrica for paresthesias.  Memory score is stable.  She will continue on Zoloft for her mood.  We  will try to wean the patient off of amitriptyline.  She will take 1 tablet every other day for 1 week and then 1 tablet every 2 days for 1 week then she will discontinue the medication.  She is advised that if her symptoms worsen or she  develop new symptoms she should let us know.  She will follow-up in 6 months or sooner if needed.    Butch PennyMegan Jannifer Fischler, MSN, NP-C 06/10/2017, 2:08 PM [facility] [address], [address] [place], [place] [address] [phone]

## 2017-08-09 DIAGNOSIS — Z9889 Other specified postprocedural states: Secondary | ICD-10-CM | POA: Insufficient documentation

## 2017-11-22 ENCOUNTER — Other Ambulatory Visit: Payer: Medicare Other

## 2017-11-22 ENCOUNTER — Ambulatory Visit: Payer: Medicare Other

## 2017-11-23 DIAGNOSIS — M180 Bilateral primary osteoarthritis of first carpometacarpal joints: Secondary | ICD-10-CM | POA: Insufficient documentation

## 2017-11-23 DIAGNOSIS — L608 Other nail disorders: Secondary | ICD-10-CM | POA: Insufficient documentation

## 2018-01-14 ENCOUNTER — Ambulatory Visit
Admission: RE | Admit: 2018-01-14 | Discharge: 2018-01-14 | Disposition: A | Discharge status: Home / Self Care | Payer: Medicare Other | Source: Ambulatory Visit | Attending: Internal Medicine | Admitting: Internal Medicine

## 2018-01-14 DIAGNOSIS — M810 Age-related osteoporosis without current pathological fracture: Secondary | ICD-10-CM

## 2018-01-14 DIAGNOSIS — Z1231 Encounter for screening mammogram for malignant neoplasm of breast: Secondary | ICD-10-CM

## 2018-02-10 ENCOUNTER — Encounter: Payer: Self-pay | Admitting: Neurology

## 2018-02-24 ENCOUNTER — Other Ambulatory Visit: Payer: Self-pay

## 2018-02-24 ENCOUNTER — Emergency Department (HOSPITAL_COMMUNITY)
Admission: EM | Admit: 2018-02-24 | Discharge: 2018-02-24 | Disposition: A | Discharge status: Home / Self Care | Payer: Medicare Other | Attending: Emergency Medicine | Admitting: Emergency Medicine

## 2018-02-24 ENCOUNTER — Encounter (HOSPITAL_COMMUNITY): Payer: Self-pay

## 2018-02-24 ENCOUNTER — Emergency Department (HOSPITAL_COMMUNITY): Payer: Medicare Other

## 2018-02-24 DIAGNOSIS — Z87891 Personal history of nicotine dependence: Secondary | ICD-10-CM | POA: Insufficient documentation

## 2018-02-24 DIAGNOSIS — R509 Fever, unspecified: Secondary | ICD-10-CM | POA: Diagnosis present

## 2018-02-24 DIAGNOSIS — J111 Influenza due to unidentified influenza virus with other respiratory manifestations: Secondary | ICD-10-CM | POA: Diagnosis not present

## 2018-02-24 DIAGNOSIS — I10 Essential (primary) hypertension: Secondary | ICD-10-CM | POA: Insufficient documentation

## 2018-02-24 DIAGNOSIS — Z79899 Other long term (current) drug therapy: Secondary | ICD-10-CM | POA: Insufficient documentation

## 2018-02-24 MED ORDER — ACETAMINOPHEN 325 MG PO TABS
650.0000 mg | ORAL_TABLET | Freq: Once | ORAL | Status: AC | PRN
  Administered 2018-02-24: 650 mg via ORAL
  Filled 2018-02-24: qty 2

## 2018-02-24 MED ORDER — OSELTAMIVIR PHOSPHATE 75 MG PO CAPS: 75.0000 mg | ORAL_CAPSULE | Freq: Two times a day (BID) | ORAL | 0 refills | Status: DC

## 2018-02-24 MED ORDER — SODIUM CHLORIDE 0.9 % IV BOLUS
500.0000 mL | Freq: Once | INTRAVENOUS | Status: AC
  Administered 2018-02-24: 500 mL via INTRAVENOUS

## 2018-02-24 NOTE — ED Notes (Signed)
Bed: WA13 Expected date:  Expected time:  Means of arrival:  Comments: EMS- flu like symptoms 

## 2018-02-24 NOTE — ED Provider Notes (Signed)
[facility]-EMERGENCY DEPT Provider Note   CSN: [id number] Arrival date & time: 02/24/18  1352     History   Chief Complaint Chief Complaint  Patient presents with  . flu symptoms  . Weakness    HPI Miranda Harrington is a 78 y.o. female.  HPI Patient presents with flulike symptoms.  Had been in [place] and around her family member that had influenza.  Family ember reportedly had a positive flu test.  States that around yesterday she began to feel worse.  Generalized weakness cough myalgias and fever up to 102 here.  States she felt lightheaded when she tried to stand up today.  No injury when she eased her self to the ground.  Has had a cough with some clear sputum production.  Family is worried about influenza.  Patient does not have any pulmonary history. Past Medical History:  Diagnosis Date  . Anemia    after a miscarriage  . Anxiety    after stroke  . Arthritis    osteoarthritis  . Carotid artery occlusion   . Hyperlipidemia   . Hypertension   . PHN (postherpetic neuralgia) 03/16/2016  . Sjogren's syndrome (HCC)   . Stroke [facility](HCC) 2008   Right hand / Right foot  Hemorrhagic stroke  . Varicose veins    bleeding episode from varicose vein right posterior calf 07-20-2015      Patient Active Problem List   Diagnosis Date Noted  . 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 Numbness and tingling in right hand 03/22/2014  . Weakness-Right Hand and Foot 03/22/2014  . Varicose veins of lower extremities with other complications 08/03/2013  . History of hemorrhagic stroke with residual hemiplegia (HCC) 03/17/2013  . Occlusion and stenosis of carotid artery without mention of cerebral infarction 08/11/2012    Past Surgical History:  Procedure Laterality Date  . ADENOIDECTOMY  1968  . DILATION AND CURETTAGE OF UTERUS    . REVERSE SHOULDER ARTHROPLASTY Right 08/20/2016  . REVERSE SHOULDER ARTHROPLASTY Right  08/20/2016   Procedure: REVERSE SHOULDER ARTHROPLASTY;  Surgeon: Francena HanlySupple, Kevin, MD;  Location: [facility] OR;  Service: Orthopedics;  Laterality: Right;  . TONSILLECTOMY  1968     OB History   No obstetric history on file.      Home Medications    Prior to Admission medications   Medication Sig Start Date End Date Taking? Authorizing Provider  Acetaminophen (TYLENOL PO) Take 2 tablets by mouth daily as needed (headache).   Yes [provider]  amitriptyline (ELAVIL) 10 MG tablet TAKE 1 TABLET BY MOUTH AT BEDTIME 12/07/16  Yes Dohmeier, Porfirio Mylararmen, MD  antiseptic oral rinse (BIOTENE) LIQD 15 mLs by Mouth Rinse route as needed for dry mouth.   Yes [provider]  aspirin EC 81 MG tablet Take 81 mg by mouth daily.   Yes [provider]  ezetimibe (ZETIA) 10 MG tablet Take 10 mg by mouth daily.   Yes [provider]  golimumab 2 mg/kg in sodium chloride 0.9 % Inject 1 application into the vein. Every other month.   Yes [provider]  hydrochlorothiazide (MICROZIDE) 12.5 MG capsule Take 12.5 mg by mouth daily.    Yes [provider]  Polyethylene Glycol 400 (BLINK TEARS OP) Apply 1 drop to eye 2 (two) times daily as needed (dry eyes).   Yes [provider]  pregabalin (LYRICA) 75 MG capsule TAKE 1 CAPSULE BY MOUTH 2 TIMES DAILY 06/10/17  Yes Butch PennyMillikan, Megan, NP  ramipril (ALTACE) 5 MG tablet Take 5 mg by mouth 2 (two) times daily.    Yes [provider]  rosuvastatin (CRESTOR) 40 MG tablet Take 40 mg by mouth at bedtime.    Yes [provider]  sertraline (ZOLOFT) 50 MG tablet Take 1 tablet (50 mg total) by mouth daily. 06/10/17  Yes Butch PennyMillikan, Megan, NP  lidocaine (LIDODERM) 5 % Place 1 patch onto the skin daily. Remove & Discard patch within 12 hours or as directed by MD Patient not taking: Reported on 02/24/2018 03/16/16   Dohmeier, Porfirio Mylararmen, MD    Family History Family History  Problem Relation Age of Onset  . Other  Mother        varicose veins  . Rheum arthritis Mother   . Cancer Father   . Hypertension Father   . Multiple sclerosis Sister   . Lymphoma Maternal Aunt   . Leukemia Grandchild     Social History Social History   Tobacco Use  . Smoking status: Former Smoker    Types: Cigarettes    Last attempt to quit: 08/11/2005    Years since quitting: 12.5  . Smokeless tobacco: Never Used  Substance Use Topics  . Alcohol use: Yes    Comment: rare  . Drug use: No     Allergies   Bee venom; Sulfa antibiotics; and Demerol [meperidine]   Review of Systems Review of Systems  Constitutional: Positive for appetite change, fatigue and fever.  HENT: Negative for congestion.   Respiratory: Positive for cough.   Cardiovascular: Negative for chest pain.  Gastrointestinal: Negative for abdominal pain.  Genitourinary: Negative for flank pain.  Musculoskeletal: Negative for arthralgias.  Neurological: Negative for weakness.     Physical Exam Updated Vital Signs BP 113/62   Pulse 77   Temp (!) 102.1 F (38.9 C) (Oral)   Resp 20   Ht 5' 1.5" (1.562 m)   Wt 79.8 kg   SpO2 97%   BMI 32.72 kg/m   Physical Exam HENT:     Head: Normocephalic.     Mouth/Throat:     Mouth: Mucous membranes are moist.  Eyes:     Extraocular Movements: Extraocular movements intact.  Neck:     Musculoskeletal: Neck supple.  Cardiovascular:     Rate and Rhythm: Normal rate and regular rhythm.  Pulmonary:     Effort: No respiratory distress.     Comments: Mildly harsh breath sounds without focal rales or rhonchi. Abdominal:     Tenderness: There is no abdominal tenderness.  Musculoskeletal:     Right lower leg: No edema.     Left lower leg: No edema.  Skin:    Capillary Refill: Capillary refill takes less than 2 seconds.     Coloration: Skin is not jaundiced.  Neurological:     General: No focal deficit present.     Mental Status: She is alert.  Psychiatric:        Mood and Affect: Mood normal.       ED Treatments / Results  Labs (all labs ordered are listed, but only abnormal results are displayed) Labs Reviewed - No data to display  EKG None  Radiology No results found.  Procedures Procedures (including critical care time)  Medications Ordered in ED Medications  sodium chloride 0.9 % bolus 500 mL (500 mLs Intravenous New Bag/Given 02/24/18 1453)  acetaminophen (TYLENOL) tablet 650 mg (650 mg Oral Given 02/24/18 1420)     Initial Impression /  Assessment and Plan / ED Course  I have reviewed the triage vital signs and the nursing notes.  Pertinent labs & imaging results that were available during my care of the patient were reviewed by me and considered in my medical decision making (see chart for details).     Patient with URI symptoms and cough.  X-ray pending.  Does have flu contact.  I would empirically treat.  Had some mild lightheadedness.  Fluid bolus given.  I think patient feels better and x-ray reassuring can likely be discharged home with Tamiflu.  Care turned over to Dr. Denton Lank  Final Clinical Impressions(s) / ED Diagnoses   Final diagnoses:  Influenza    ED Discharge Orders    None       Benjiman Core, MD 02/24/18 1545

## 2018-02-24 NOTE — Discharge Instructions (Signed)
It was our pleasure to provide your ER care today - we hope that you feel better.  Rest. Drink plenty of fluids.  Take acetaminophen and/or ibuprofen as need for fever.   Take tamiflu as prescribed.   Follow up with primary care doctor in 1-2 weeks if symptoms fail to improve/resolve.  Return to ER if worse, new symptoms, increased trouble breathing, other concern.

## 2018-02-24 NOTE — ED Triage Notes (Addendum)
Per EMS- Patient spent the [place] in [place] and family members had the flu. Patient began having flu symptoms and weakness yesterday. Patient states she fell this AM on a carpeted floor. Patient denies any LOC, hitting her head or being on blood thinners.  EMS states that the patient's daughter wanted her mother to get the flu swab and possible Tamiflu if needed.   During triage, the patient states she did not fall this AM. Patient states, "I just went gracefully down. Not a fall."

## 2018-05-13 ENCOUNTER — Encounter (HOSPITAL_BASED_OUTPATIENT_CLINIC_OR_DEPARTMENT_OTHER): Payer: Self-pay

## 2018-05-13 ENCOUNTER — Ambulatory Visit (HOSPITAL_BASED_OUTPATIENT_CLINIC_OR_DEPARTMENT_OTHER): Admit: 2018-05-13 | Payer: Medicare Other | Admitting: Ophthalmology

## 2018-05-13 SURGERY — STRABISMUS SURGERY, PEDIATRIC
Anesthesia: General | Laterality: Right

## 2018-06-15 ENCOUNTER — Ambulatory Visit: Payer: Medicare Other | Admitting: Nurse Practitioner

## 2018-06-27 ENCOUNTER — Telehealth: Payer: Self-pay | Admitting: Adult Health

## 2018-06-27 NOTE — Telephone Encounter (Signed)
Pt called to c/a her upcoming appt due to the current covid restrictions- pt requesting a call to discuss when the office will be reopening for pt and would like an in office visit, stating she doesn't have access to a computer or smart phone.

## 2018-06-30 ENCOUNTER — Ambulatory Visit: Payer: Medicare Other | Admitting: Adult Health

## 2018-08-10 DIAGNOSIS — M65341 Trigger finger, right ring finger: Secondary | ICD-10-CM | POA: Insufficient documentation

## 2018-08-11 ENCOUNTER — Ambulatory Visit: Payer: Self-pay | Admitting: Ophthalmology

## 2018-08-30 ENCOUNTER — Encounter (HOSPITAL_BASED_OUTPATIENT_CLINIC_OR_DEPARTMENT_OTHER): Payer: Self-pay | Admitting: *Deleted

## 2018-08-30 ENCOUNTER — Encounter (HOSPITAL_BASED_OUTPATIENT_CLINIC_OR_DEPARTMENT_OTHER)
Admission: RE | Admit: 2018-08-30 | Discharge: 2018-08-30 | Disposition: A | Discharge status: Home / Self Care | Payer: Medicare Other | Source: Ambulatory Visit | Attending: Ophthalmology | Admitting: Ophthalmology

## 2018-08-30 ENCOUNTER — Other Ambulatory Visit (HOSPITAL_COMMUNITY)
Admission: RE | Admit: 2018-08-30 | Discharge: 2018-08-30 | Disposition: A | Discharge status: Home / Self Care | Payer: Medicare Other | Source: Ambulatory Visit | Attending: Ophthalmology | Admitting: Ophthalmology

## 2018-08-30 ENCOUNTER — Other Ambulatory Visit: Payer: Self-pay

## 2018-08-30 DIAGNOSIS — Z1159 Encounter for screening for other viral diseases: Secondary | ICD-10-CM | POA: Diagnosis not present

## 2018-08-30 DIAGNOSIS — Z01818 Encounter for other preprocedural examination: Secondary | ICD-10-CM | POA: Insufficient documentation

## 2018-08-30 DIAGNOSIS — H5000 Unspecified esotropia: Secondary | ICD-10-CM | POA: Diagnosis not present

## 2018-08-30 LAB — BASIC METABOLIC PANEL
Anion gap: 9 (ref 5–15)
BUN: 28 mg/dL — ABNORMAL HIGH (ref 8–23)
CO2: 30 mmol/L (ref 22–32)
Calcium: 9.5 mg/dL (ref 8.9–10.3)
Chloride: 102 mmol/L (ref 98–111)
Creatinine, Ser: 0.76 mg/dL (ref 0.44–1.00)
GFR calc Af Amer: >60 mL/min (ref >60)
GFR calc non Af Amer: >60 mL/min (ref >60)
Glucose, Bld: 81 mg/dL (ref 70–99)
Potassium: 4.6 mmol/L (ref 3.5–5.1)
Sodium: 141 mmol/L (ref 135–145)

## 2018-08-30 LAB — SARS CORONAVIRUS 2 (TAT 6-24 HRS): SARS Coronavirus 2: NEGATIVE

## 2018-08-31 ENCOUNTER — Ambulatory Visit: Payer: Self-pay | Admitting: Adult Health

## 2018-09-02 ENCOUNTER — Ambulatory Visit (HOSPITAL_BASED_OUTPATIENT_CLINIC_OR_DEPARTMENT_OTHER): Payer: Medicare Other | Admitting: Anesthesiology

## 2018-09-02 ENCOUNTER — Encounter (HOSPITAL_BASED_OUTPATIENT_CLINIC_OR_DEPARTMENT_OTHER)
Admission: RE | Disposition: A | Discharge status: Home / Self Care | Payer: Self-pay | Source: Home / Self Care | Attending: Ophthalmology

## 2018-09-02 ENCOUNTER — Ambulatory Visit (HOSPITAL_BASED_OUTPATIENT_CLINIC_OR_DEPARTMENT_OTHER)
Admission: RE | Admit: 2018-09-02 | Discharge: 2018-09-02 | Disposition: A | Discharge status: Home / Self Care | Payer: Medicare Other | Attending: Ophthalmology | Admitting: Ophthalmology

## 2018-09-02 ENCOUNTER — Encounter (HOSPITAL_BASED_OUTPATIENT_CLINIC_OR_DEPARTMENT_OTHER): Payer: Self-pay | Admitting: Anesthesiology

## 2018-09-02 ENCOUNTER — Other Ambulatory Visit: Payer: Self-pay

## 2018-09-02 DIAGNOSIS — M1990 Unspecified osteoarthritis, unspecified site: Secondary | ICD-10-CM | POA: Diagnosis not present

## 2018-09-02 DIAGNOSIS — M3500 Sicca syndrome, unspecified: Secondary | ICD-10-CM | POA: Insufficient documentation

## 2018-09-02 DIAGNOSIS — Z885 Allergy status to narcotic agent status: Secondary | ICD-10-CM | POA: Insufficient documentation

## 2018-09-02 DIAGNOSIS — Z87891 Personal history of nicotine dependence: Secondary | ICD-10-CM | POA: Diagnosis not present

## 2018-09-02 DIAGNOSIS — I6930 Unspecified sequelae of cerebral infarction: Secondary | ICD-10-CM | POA: Diagnosis not present

## 2018-09-02 DIAGNOSIS — E785 Hyperlipidemia, unspecified: Secondary | ICD-10-CM | POA: Insufficient documentation

## 2018-09-02 DIAGNOSIS — Z882 Allergy status to sulfonamides status: Secondary | ICD-10-CM | POA: Insufficient documentation

## 2018-09-02 DIAGNOSIS — H5000 Unspecified esotropia: Secondary | ICD-10-CM | POA: Diagnosis present

## 2018-09-02 DIAGNOSIS — I739 Peripheral vascular disease, unspecified: Secondary | ICD-10-CM | POA: Diagnosis not present

## 2018-09-02 DIAGNOSIS — I10 Essential (primary) hypertension: Secondary | ICD-10-CM | POA: Diagnosis not present

## 2018-09-02 HISTORY — PX: STRABISMUS SURGERY: SHX218

## 2018-09-02 SURGERY — REPAIR STRABISMUS
Anesthesia: General | Site: Eye | Laterality: Right

## 2018-09-02 MED ORDER — DEXAMETHASONE SODIUM PHOSPHATE 10 MG/ML IJ SOLN
INTRAMUSCULAR | Status: AC
  Filled 2018-09-02: qty 1

## 2018-09-02 MED ORDER — ONDANSETRON HCL 4 MG/2ML IJ SOLN: 4.0000 mg | Freq: Once | INTRAMUSCULAR | Status: DC | PRN

## 2018-09-02 MED ORDER — ACETAMINOPHEN 500 MG PO TABS
1000.0000 mg | ORAL_TABLET | Freq: Once | ORAL | Status: AC
  Administered 2018-09-02: 1000 mg via ORAL

## 2018-09-02 MED ORDER — FENTANYL CITRATE (PF) 100 MCG/2ML IJ SOLN
INTRAMUSCULAR | Status: AC
  Filled 2018-09-02: qty 2

## 2018-09-02 MED ORDER — EPHEDRINE SULFATE 50 MG/ML IJ SOLN
INTRAMUSCULAR | Status: DC | PRN
  Administered 2018-09-02 (×2): 10 mg via INTRAVENOUS

## 2018-09-02 MED ORDER — LIDOCAINE 2% (20 MG/ML) 5 ML SYRINGE
INTRAMUSCULAR | Status: AC
  Filled 2018-09-02: qty 5

## 2018-09-02 MED ORDER — PHENYLEPHRINE HCL (PRESSORS) 10 MG/ML IV SOLN
INTRAVENOUS | Status: DC | PRN
  Administered 2018-09-02 (×2): 80 ug via INTRAVENOUS

## 2018-09-02 MED ORDER — ONDANSETRON HCL 4 MG/2ML IJ SOLN
INTRAMUSCULAR | Status: AC
  Filled 2018-09-02: qty 2

## 2018-09-02 MED ORDER — PROPOFOL 10 MG/ML IV BOLUS
INTRAVENOUS | Status: DC | PRN
  Administered 2018-09-02: 100 mg via INTRAVENOUS

## 2018-09-02 MED ORDER — MIDAZOLAM HCL 2 MG/2ML IJ SOLN: 1.0000 mg | INTRAMUSCULAR | Status: DC | PRN

## 2018-09-02 MED ORDER — LACTATED RINGERS IV SOLN
INTRAVENOUS | Status: DC
  Administered 2018-09-02: 08:00:00 via INTRAVENOUS

## 2018-09-02 MED ORDER — FENTANYL CITRATE (PF) 100 MCG/2ML IJ SOLN
50.0000 ug | INTRAMUSCULAR | Status: AC | PRN
  Administered 2018-09-02: 09:00:00 50 ug via INTRAVENOUS
  Administered 2018-09-02 (×2): 25 ug via INTRAVENOUS

## 2018-09-02 MED ORDER — GLYCOPYRROLATE 0.2 MG/ML IJ SOLN
INTRAMUSCULAR | Status: DC | PRN
  Administered 2018-09-02: 0.2 mg via INTRAVENOUS

## 2018-09-02 MED ORDER — FENTANYL CITRATE (PF) 100 MCG/2ML IJ SOLN
25.0000 ug | INTRAMUSCULAR | Status: DC | PRN
  Administered 2018-09-02: 25 ug via INTRAVENOUS

## 2018-09-02 MED ORDER — DEXAMETHASONE SODIUM PHOSPHATE 4 MG/ML IJ SOLN
INTRAMUSCULAR | Status: DC | PRN
  Administered 2018-09-02: 5 mg via INTRAVENOUS

## 2018-09-02 MED ORDER — ACETAMINOPHEN 500 MG PO TABS
ORAL_TABLET | ORAL | Status: AC
  Filled 2018-09-02: qty 2

## 2018-09-02 MED ORDER — TOBRAMYCIN-DEXAMETHASONE 0.3-0.1 % OP OINT
TOPICAL_OINTMENT | OPHTHALMIC | Status: DC | PRN
  Administered 2018-09-02: 1 via OPHTHALMIC

## 2018-09-02 MED ORDER — PROPOFOL 10 MG/ML IV BOLUS
INTRAVENOUS | Status: AC
  Filled 2018-09-02: qty 20

## 2018-09-02 MED ORDER — ONDANSETRON HCL 4 MG/2ML IJ SOLN
INTRAMUSCULAR | Status: DC | PRN
  Administered 2018-09-02: 4 mg via INTRAVENOUS

## 2018-09-02 MED ORDER — SCOPOLAMINE 1 MG/3DAYS TD PT72: 1.0000 | MEDICATED_PATCH | Freq: Once | TRANSDERMAL | Status: DC

## 2018-09-02 MED ORDER — LIDOCAINE HCL (CARDIAC) PF 100 MG/5ML IV SOSY
PREFILLED_SYRINGE | INTRAVENOUS | Status: DC | PRN
  Administered 2018-09-02: 80 mg via INTRAVENOUS

## 2018-09-02 SURGICAL SUPPLY — 33 items
APPLICATOR COTTON TIP 6 STRL (MISCELLANEOUS) ×4 IMPLANT
APPLICATOR COTTON TIP 6IN STRL (MISCELLANEOUS) ×12
APPLICATOR DR MATTHEWS STRL (MISCELLANEOUS) ×3 IMPLANT
BNDG EYE OVAL (GAUZE/BANDAGES/DRESSINGS) IMPLANT
CAUTERY EYE LOW TEMP 1300F FIN (OPHTHALMIC RELATED) IMPLANT
CLOSURE WOUND 1/4X4 (GAUZE/BANDAGES/DRESSINGS)
COVER BACK TABLE REUSABLE LG (DRAPES) ×3 IMPLANT
COVER MAYO STAND REUSABLE (DRAPES) ×3 IMPLANT
COVER WAND RF STERILE (DRAPES) IMPLANT
DRAPE SPLIT 6X30 W/TAPE (DRAPES) IMPLANT
DRAPE SURG 17X23 STRL (DRAPES) ×6 IMPLANT
GLOVE BIO SURGEON STRL SZ 6.5 (GLOVE) ×4 IMPLANT
GLOVE BIO SURGEONS STRL SZ 6.5 (GLOVE) ×2
GLOVE BIOGEL M STRL SZ7.5 (GLOVE) ×3 IMPLANT
GOWN STRL REUS W/ TWL LRG LVL3 (GOWN DISPOSABLE) ×1 IMPLANT
GOWN STRL REUS W/TWL LRG LVL3 (GOWN DISPOSABLE) ×2
GOWN STRL REUS W/TWL XL LVL3 (GOWN DISPOSABLE) ×3 IMPLANT
NS IRRIG 1000ML POUR BTL (IV SOLUTION) ×3 IMPLANT
PACK BASIN DAY SURGERY FS (CUSTOM PROCEDURE TRAY) ×3 IMPLANT
SHEET MEDIUM DRAPE 40X70 STRL (DRAPES) IMPLANT
SPEAR EYE SURG WECK-CEL (MISCELLANEOUS) ×6 IMPLANT
STRIP CLOSURE SKIN 1/4X4 (GAUZE/BANDAGES/DRESSINGS) IMPLANT
SUT 6 0 SILK T G140 8DA (SUTURE) ×3 IMPLANT
SUT MERSILENE 6-0 18IN S14 8MM (SUTURE)
SUT PLAIN 6 0 TG1408 (SUTURE) ×3 IMPLANT
SUT SILK 4 0 C 3 735G (SUTURE) IMPLANT
SUT VICRYL 6 0 S 28 (SUTURE) IMPLANT
SUT VICRYL ABS 6-0 S29 18IN (SUTURE) ×3 IMPLANT
SUTURE MERSLN 6-0 18IN S14 8MM (SUTURE) IMPLANT
SYR 10ML LL (SYRINGE) ×3 IMPLANT
SYR TB 1ML LL NO SAFETY (SYRINGE) ×3 IMPLANT
TOWEL GREEN STERILE FF (TOWEL DISPOSABLE) ×3 IMPLANT
TRAY DSU PREP LF (CUSTOM PROCEDURE TRAY) ×3 IMPLANT

## 2018-09-02 NOTE — Anesthesia Preprocedure Evaluation (Addendum)
Anesthesia Evaluation  Patient identified by MRN, date of birth, ID band Patient awake    Reviewed: Allergy & Precautions, NPO status , Patient's Chart, lab work & pertinent test results  Airway Mallampati: II  TM Distance: >3 FB Neck ROM: Full    Dental  (+) Dental Advisory Given, Missing, Partial Lower, Partial Upper   Pulmonary former smoker,    Pulmonary exam normal breath sounds clear to auscultation       Cardiovascular hypertension, Pt. on medications (-) angina+ Peripheral Vascular Disease  (-) Past MI Normal cardiovascular exam Rhythm:Regular Rate:Normal     Neuro/Psych PSYCHIATRIC DISORDERS Anxiety CVA, Residual Symptoms    GI/Hepatic negative GI ROS, Neg liver ROS, neg GERD  ,  Endo/Other  Obesity   Renal/GU negative Renal ROS     Musculoskeletal  (+) Arthritis , Sjogren's syndrome    Abdominal   Peds  Hematology negative hematology ROS (+)   Anesthesia Other Findings Day of surgery medications reviewed with the patient.  ESOTROPIA  Reproductive/Obstetrics                            Anesthesia Physical Anesthesia Plan  ASA: III  Anesthesia Plan: General   Post-op Pain Management:    Induction: Intravenous  PONV Risk Score and Plan: 3 and Dexamethasone, Ondansetron and Treatment may vary due to age or medical condition  Airway Management Planned: LMA  Additional Equipment:   Intra-op Plan:   Post-operative Plan: Extubation in OR  Informed Consent: I have reviewed the patients History and Physical, chart, labs and discussed the procedure including the risks, benefits and alternatives for the proposed anesthesia with the patient or authorized representative who has indicated his/her understanding and acceptance.     Dental advisory given  Plan Discussed with: CRNA  Anesthesia Plan Comments:         Anesthesia Quick Evaluation

## 2018-09-02 NOTE — Op Note (Signed)
09/02/2018  10:03 AM  PATIENT:  Miranda Harrington  78 y.o. female  PRE-OPERATIVE DIAGNOSIS:  Esotropia, consecutive  POST-OPERATIVE DIAGNOSIS:  same  PROCEDURE:  Medial rectus muscle recession  5.0 mm right eye (previously resected)  SURGEON:  Lorne Skeens.Annamaria Boots, M.D.   ANESTHESIA:   general  COMPLICATIONS:None  DESCRIPTION OF PROCEDURE: The patient was taken to the operating room where She was identified by me. General anesthesia was induced without difficulty after placement of appropriate monitors. The patient was prepped and draped in standard sterile fashion. A lid speculum was placed in the right eye.  Through an inferonasal fornix incision through conjunctiva and Tenon's fascia, the previously resected right medial rectus muscle was engaged on a series of muscle hooks and cleared of its fascial attachments and scar tissue. The muscle was secured with a double-armed 6-0 Vicryl suture with a locking bite at each border of the muscle, 1 mm from the insertion. The muscle was disinserted, and was reattached to sclera at a measured distance of 5.0 millimeters posterior to the original insertion, using direct scleral passes in crossed swords fashion.  The suture ends were tied securely after the position of the muscle had been checked and found to be accurate. The conjunctival incision was not closed because it appeared that closing it would limit abduction.  The posterior edge of the conjunctival incision was secured to sclera deep in the inferonasal  fornix.  The anterior edge was secured to sclera where it naturally lay on sclera, leaving a gap of approximately 2-3 mm between the edges.  TobraDex ointment was placed in the right eye. The patient was awakened without difficulty and taken to the recovery room in stable condition, having suffered no intraoperative or immediate postoperative complications.  Lorne Skeens. Gianni Fuchs M.D.    PATIENT DISPOSITION:  PACU - hemodynamically stable.

## 2018-09-02 NOTE — Discharge Instructions (Signed)
°  NO TYLENOL BEFORE 2PM TODAY!       Diet: Clear liquids, advance to soft foods then regular diet as tolerated by this evening.  Pain control:   1)  Acetaminophen 500-700 mg by mouth every 4-6 hours as needed for pain  2)  Ice pack/cold compress to operated eye(s) as desired  Eye medications:    Tobradex or Zylet eye ointment 1/2 inch in operated eye(s) twice a day  Activity: No swimming for 1 week.  It is OK to let water run over the face and eyes while showering or taking a bath, even during the first week.  No other restriction on exercise or activity.   Call Dr. Janee Morn office [phone] with any problems or concerns.       Post Anesthesia Home Care Instructions  Activity: Get plenty of rest for the remainder of the day. A responsible individual must stay with you for 24 hours following the procedure.  For the next 24 hours, DO NOT: -Drive a car -Paediatric nurse -Drink alcoholic beverages -Take any medication unless instructed by your physician -Make any legal decisions or sign important papers.  Meals: Start with liquid foods such as gelatin or soup. Progress to regular foods as tolerated. Avoid greasy, spicy, heavy foods. If nausea and/or vomiting occur, drink only clear liquids until the nausea and/or vomiting subsides. Call your physician if vomiting continues.  Special Instructions/Symptoms: Your throat may feel dry or sore from the anesthesia or the breathing tube placed in your throat during surgery. If this causes discomfort, gargle with warm salt water. The discomfort should disappear within 24 hours.  If you had a scopolamine patch placed behind your ear for the management of post- operative nausea and/or vomiting:  1. The medication in the patch is effective for 72 hours, after which it should be removed.  Wrap patch in a tissue and discard in the trash. Wash hands thoroughly with soap and water. 2. You may remove the patch earlier than 72 hours if you  experience unpleasant side effects which may include dry mouth, dizziness or visual disturbances. 3. Avoid touching the patch. Wash your hands with soap and water after contact with the patch.

## 2018-09-02 NOTE — Interval H&P Note (Signed)
History and Physical Interval Note:  09/02/2018 8:46 AM  Miranda Harrington  has presented today for surgery, with the diagnosis of ESOTROPIA.  The various methods of treatment have been discussed with the patient and family. After consideration of risks, benefits and other options for treatment, the patient has consented to  Procedure(s): REPAIR STRABISMUS RIGHT EYE (Right) as a surgical intervention.  The patient's history has been reviewed, patient examined, no change in status, stable for surgery.  I have reviewed the patient's chart and labs.  Questions were answered to the patient's satisfaction.     Derry Skill

## 2018-09-02 NOTE — H&P (Signed)
Date of examination:  08-22-18  Indication for surgery: to straighten the eyes and relieve double vision  Pertinent past medical history:  Past Medical History:  Diagnosis Date  . Anemia    after a miscarriage  . Anxiety    after stroke  . Arthritis    osteoarthritis  . Carotid artery occlusion   . Hyperlipidemia   . Hypertension   . PHN (postherpetic neuralgia) 03/16/2016  . Sjogren's syndrome ([place])   . Stroke [facility]) 2008   Right hand / Right foot  Hemorrhagic stroke  . Varicose veins    bleeding episode from varicose vein right posterior calf 07-20-2015      Pertinent ocular history:  Hx trauma OD 30 years ago, CVA '08, RXTx3 years, R&R OR for XT 5/20, consec ET with diplopia  Pertinent family history:  Family History  Problem Relation Age of Onset  . Other Mother        varicose veins  . Rheum arthritis Mother   . Cancer Father   . Hypertension Father   . Multiple sclerosis Sister   . Lymphoma Maternal Aunt   . Leukemia Grandchild     General:  Healthy appearing patient in no distress.    Eyes:    Acuity  cc OD 20/150  OS 20/30  External: Within normal limits     Anterior segment: Within normal limits   X healed conj scars OD, PCIOL OU  Motility:   ET=17, ET'=15  Fundus deferred  Refraction:   cyl ou    Heart: Regular rate and rhythm without murmur     Lungs: Clear to auscultation      Impression:jConsecutive esotropia  Plan: Recession of resected right medial rectus muscle  Derry Skill

## 2018-09-02 NOTE — Anesthesia Procedure Notes (Signed)
Procedure Name: LMA Insertion Date/Time: 09/02/2018 9:06 AM Performed by: Maryella Shivers, CRNA Pre-anesthesia Checklist: Patient identified, Emergency Drugs available, Suction available and Patient being monitored Patient Re-evaluated:Patient Re-evaluated prior to induction Oxygen Delivery Method: Circle system utilized Preoxygenation: Pre-oxygenation with 100% oxygen Induction Type: IV induction Ventilation: Mask ventilation without difficulty LMA: LMA flexible inserted LMA Size: 4.0 Number of attempts: 1 Airway Equipment and Method: Bite block Placement Confirmation: positive ETCO2 Tube secured with: Tape Dental Injury: Teeth and Oropharynx as per pre-operative assessment

## 2018-09-02 NOTE — Transfer of Care (Signed)
Immediate Anesthesia Transfer of Care Note  Patient: Miranda Harrington  Procedure(s) Performed: REPAIR STRABISMUS RIGHT EYE (Right Eye)  Patient Location: PACU  Anesthesia Type:General  Level of Consciousness: awake, alert  and oriented  Airway & Oxygen Therapy: Patient Spontanous Breathing and Patient connected to nasal cannula oxygen  Post-op Assessment: Report given to RN and Post -op Vital signs reviewed and stable  Post vital signs: Reviewed and stable  Last Vitals:  Vitals Value Taken Time  BP 136/71 09/02/18 1004  Temp    Pulse 85 09/02/18 1007  Resp 15 09/02/18 1007  SpO2 100 % 09/02/18 1007  Vitals shown include unvalidated device data.  Last Pain:  Vitals:   09/02/18 0806  TempSrc: Oral  PainSc: 0-No pain         Complications: No apparent anesthesia complications

## 2018-09-04 NOTE — Anesthesia Postprocedure Evaluation (Signed)
Anesthesia Post Note  Patient: Miranda Harrington  Procedure(s) Performed: REPAIR STRABISMUS RIGHT EYE (Right Eye)     Patient location during evaluation: PACU Anesthesia Type: General Level of consciousness: awake and alert Pain management: pain level controlled Vital Signs Assessment: post-procedure vital signs reviewed and stable Respiratory status: spontaneous breathing, nonlabored ventilation, respiratory function stable and patient connected to nasal cannula oxygen Cardiovascular status: blood pressure returned to baseline and stable Postop Assessment: no apparent nausea or vomiting Anesthetic complications: no    Last Vitals:  Vitals:   09/02/18 1112 09/02/18 1215  BP: 130/63 (!) 153/69  Pulse: (!) 56   Resp: 10   Temp:  36.5 C  SpO2: 95%     Last Pain:  Vitals:   09/02/18 1200  TempSrc:   PainSc: 3                  Catalina Gravel

## 2018-09-05 ENCOUNTER — Encounter (HOSPITAL_BASED_OUTPATIENT_CLINIC_OR_DEPARTMENT_OTHER): Payer: Self-pay | Admitting: Ophthalmology

## 2018-12-21 ENCOUNTER — Encounter: Payer: Self-pay | Admitting: Adult Health

## 2018-12-21 ENCOUNTER — Ambulatory Visit (INDEPENDENT_AMBULATORY_CARE_PROVIDER_SITE_OTHER): Payer: Medicare Other | Admitting: Adult Health

## 2018-12-21 ENCOUNTER — Other Ambulatory Visit: Payer: Self-pay

## 2018-12-21 VITALS — BP 152/83 | HR 50 | Temp 97.7°F | Ht 61.0 in | Wt 182.8 lb

## 2018-12-21 DIAGNOSIS — R202 Paresthesia of skin: Secondary | ICD-10-CM | POA: Diagnosis not present

## 2018-12-21 DIAGNOSIS — Z8673 Personal history of transient ischemic attack (TIA), and cerebral infarction without residual deficits: Secondary | ICD-10-CM

## 2018-12-21 DIAGNOSIS — R269 Unspecified abnormalities of gait and mobility: Secondary | ICD-10-CM

## 2018-12-21 NOTE — Patient Instructions (Addendum)
Your Plan:  Continue to monitor symptoms  Speak to Pain management   Thank you for coming to see [place] at [facility]. I hope we have been able to provide you high quality care today.  You may receive a patient satisfaction survey over the next few weeks. We would appreciate your feedback and comments so that we may continue to improve ourselves and the health of our patients.

## 2018-12-21 NOTE — Progress Notes (Signed)
PATIENT: Miranda Harrington DOB: Oct 15, 1940  REASON FOR VISIT: follow up HISTORY FROM: patient  HISTORY OF PRESENT ILLNESS: Today 12/21/18:  Miranda Harrington is a 78 year old female with a history of stroke and paresthesias on the right side.  She returns today for follow-up.  She states that Lyrica was no longer helping her paresthesias.  She weaned herself off of this medication.  She is seeing a pain specialist for paresthesias in the right leg.  She has states that she has tried gabapentin and is currently on hydrocodone and neither one of her much benefit.  In the past she has been on amitriptyline but has since weaned off of this medication.  She remains on Zoloft.  She states that during the summer months her right ankle will swell.  She describes sharp shooting pains in the right foot.  She has noticed some changes in her gait.  She denies any falls.  She does use a cane.  She returns today for an evaluation.  HISTORY 06/10/17   Miranda Harrington is a 78 year old female with a history of stroke, memory disturbance and paresthesias on the right side.  She returns today for follow-up.  She continues on Lyrica and reports that this helps with paresthesias.  She states that she came to have a flareup when there are weather changes.  She continues on aspirin daily.  Blood pressure is in normal range today.  The patient continues to use Voltaren gel as needed on the right shoulder.  She continues on Zoloft and amitriptyline.  She is unsure why she is taking amitriptyline.  She would like to discontinue this medication if possible.  She returns today for an evaluation.  REVIEW OF SYSTEMS: Out of a complete 14 system review of symptoms, the patient complains only of the following symptoms, and all other reviewed systems are negative.  See HPI  ALLERGIES: Allergies  Allergen Reactions  . Bee Venom Anaphylaxis and Hives  . Sulfa Antibiotics Hives  . Demerol [Meperidine] Other (See Comments)   hallucination    HOME MEDICATIONS: Outpatient Medications Prior to Visit  Medication Sig Dispense Refill  . Acetaminophen (TYLENOL PO) Take 2 tablets by mouth daily as needed (headache).    Marland Kitchen amitriptyline (ELAVIL) 10 MG tablet TAKE 1 TABLET BY MOUTH AT BEDTIME 90 tablet 0  . aspirin EC 81 MG tablet Take 81 mg by mouth daily.    . diclofenac (VOLTAREN) 75 MG EC tablet     . ezetimibe (ZETIA) 10 MG tablet Take 10 mg by mouth daily.    Marland Kitchen golimumab 2 mg/kg in sodium chloride 0.9 % Inject 1 application into the vein. Every other month.    . hydrochlorothiazide (MICROZIDE) 12.5 MG capsule Take 12.5 mg by mouth daily.     Marland Kitchen HYDROcodone-acetaminophen (NORCO) 10-325 MG tablet Take 1 tablet by mouth 3 (three) times daily as needed.    . predniSONE (DELTASONE) 5 MG tablet TAKE AS DIRECTED BY PHYSICIAN OR PACKAGE INSTRUCTIONS.    Marland Kitchen pregabalin (LYRICA) 75 MG capsule TAKE 1 CAPSULE BY MOUTH 2 TIMES DAILY 180 capsule 0  . ramipril (ALTACE) 5 MG tablet Take 5 mg by mouth 2 (two) times daily.     . rosuvastatin (CRESTOR) 40 MG tablet Take 40 mg by mouth at bedtime.     . sertraline (ZOLOFT) 50 MG tablet Take 1 tablet (50 mg total) by mouth daily. 90 tablet 11  . antiseptic oral rinse (BIOTENE) LIQD 15 mLs by Mouth Rinse route as needed  for dry mouth.     No facility-administered medications prior to visit.     PAST MEDICAL HISTORY: Past Medical History:  Diagnosis Date  . Anemia    after a miscarriage  . Anxiety    after stroke  . Arthritis    osteoarthritis  . Carotid artery occlusion   . Hyperlipidemia   . Hypertension   . PHN (postherpetic neuralgia) 03/16/2016  . Sjogren's syndrome ([facility])   . Stroke [facility]) 2008   Right hand / Right foot  Hemorrhagic stroke  . Varicose veins    bleeding episode from varicose vein right posterior calf 07-20-2015      PAST SURGICAL HISTORY: Past Surgical History:  Procedure Laterality Date  . ADENOIDECTOMY  1968  . DILATION AND CURETTAGE OF UTERUS     . EYE SURGERY    . REVERSE SHOULDER ARTHROPLASTY Right 08/20/2016  . REVERSE SHOULDER ARTHROPLASTY Right 08/20/2016   Procedure: REVERSE SHOULDER ARTHROPLASTY;  Surgeon: Francena HanlySupple, Kevin, MD;  Location: [facility] OR;  Service: Orthopedics;  Laterality: Right;  . STRABISMUS SURGERY Right 09/02/2018   Procedure: REPAIR STRABISMUS RIGHT EYE;  Surgeon: Verne CarrowYoung, William, MD;  Location: [facility];  Service: Ophthalmology;  Laterality: Right;  . TONSILLECTOMY  1968    FAMILY HISTORY: Family History  Problem Relation Age of Onset  . Other Mother        varicose veins  . Rheum arthritis Mother   . Cancer Father   . Hypertension Father   . Multiple sclerosis Sister   . Lymphoma Maternal Aunt   . Leukemia Grandchild     SOCIAL HISTORY: Social History   Socioeconomic History  . Marital status: Divorced    Spouse name: Not on file  . Number of children: 3  . Years of education: college  . Highest education level: Not on file  Occupational History  . Not on file  Social Needs  . Financial resource strain: Not on file  . Food insecurity    Worry: Not on file    Inability: Not on file  . Transportation needs    Medical: Not on file    Non-medical: Not on file  Tobacco Use  . Smoking status: Former Smoker    Types: Cigarettes    Quit date: 08/11/2005    Years since quitting: 13.3  . Smokeless tobacco: Never Used  Substance and Sexual Activity  . Alcohol use: Yes    Comment: rare  . Drug use: No  . Sexual activity: Not on file  Lifestyle  . Physical activity    Days per week: Not on file    Minutes per session: Not on file  . Stress: Not on file  Relationships  . Social Musicianconnections    Talks on phone: Not on file    Gets together: Not on file    Attends religious service: Not on file    Active member of club or organization: Not on file    Attends meetings of clubs or organizations: Not on file    Relationship status: Not on file  . Intimate partner violence    Fear  of current or ex partner: Not on file    Emotionally abused: Not on file    Physically abused: Not on file    Forced sexual activity: Not on file  Other Topics Concern  . Not on file  Social History Narrative   Patient is divorced.   Patient has three children.   Patient does not drink any caffeine.  Patient is right-handed.   Patient has a college education.      PHYSICAL EXAM  Vitals:   12/21/18 1354  BP: (!) 152/83  Pulse: (!) 50  Temp: 97.7 F (36.5 C)  Weight: 182 lb 12.8 oz (82.9 kg)  Height: 5\' 1"  (1.549 m)   Body mass index is 34.54 kg/m.   Montreal Cognitive Assessment  06/10/2017 04/22/2015  Visuospatial/ Executive (0/5) 4 3  Naming (0/3) 2 3  Attention: Read list of digits (0/2) 2 1  Attention: Read list of letters (0/1) 1 1  Attention: Serial 7 subtraction starting at 100 (0/3) 2 2  Language: Repeat phrase (0/2) 2 2  Language : Fluency (0/1) 0 0  Abstraction (0/2) 2 2  Delayed Recall (0/5) 5 4  Orientation (0/6) 6 6  Total 26 24  Adjusted Score (based on education) 26 24     Generalized: Well developed, in no acute distress   Neurological examination  Mentation: Alert oriented to time, place, history taking. Follows all commands speech and language fluent Cranial nerve II-XII: Pupils were equal round reactive to light. Extraocular movements were full, visual field were full on confrontational test.  Head turning and shoulder shrug  were normal and symmetric. Motor: The motor testing reveals 5 over 5 strength of all 4 extremities. Good symmetric motor tone is noted throughout.  Sensory: Sensory testing is intact to soft touch on all 4 extremities.  Pinprick sensation decreased in the right lower extremity extending up to the knee..  Coordination: Cerebellar testing reveals good finger-nose-finger and heel-to-shin bilaterally.  Gait and station: Gait is slow and cautious.  She uses a cane when ambulating..  Reflexes: Deep tendon reflexes are symmetric  and normal bilaterally.   DIAGNOSTIC DATA (LABS, IMAGING, TESTING) - I reviewed patient records, labs, notes, testing and imaging myself where available.  Lab Results  Component Value Date   WBC 6.8 08/13/2016   HGB 13.6 08/13/2016   HCT 43.3 08/13/2016   MCV 91.7 08/13/2016   PLT 150 08/13/2016      Component Value Date/Time   NA 141 08/30/2018 1500   K 4.6 08/30/2018 1500   CL 102 08/30/2018 1500   CO2 30 08/30/2018 1500   GLUCOSE 81 08/30/2018 1500   BUN 28 (H) 08/30/2018 1500   CREATININE 0.76 08/30/2018 1500   CALCIUM 9.5 08/30/2018 1500   GFRNONAA >60 08/30/2018 1500   GFRAA >60 08/30/2018 1500      ASSESSMENT AND PLAN 78 y.o. year old female  has a past medical history of Anemia, Anxiety, Arthritis, Carotid artery occlusion, Hyperlipidemia, Hypertension, PHN (postherpetic neuralgia) (03/16/2016), Sjogren's syndrome ([place]), Stroke ([place]) (2008), and Varicose veins. here with:   History of stroke  -Continue to keep good control of blood pressure and cholesterol -Blood pressure slightly elevated today    Paresthesias in the right lower extremity   -Discussed different medication options for paresthesias.  The      patient prefers to discuss with pain management first.  Abnormality of gait and balance  -Referral to physical therapy  The patient plans to discuss medication options for paresthesias with her pain management specialist.  I advised that she should keep regular follow-ups with her PCP.  She can follow-up with our office on an as-needed basis.    Ward Givens, MSN, NP-C 12/21/2018, 2:02 PM [facility] [address] [place], [place] [address] [phone]

## 2019-01-23 ENCOUNTER — Ambulatory Visit: Payer: Medicare Other | Admitting: Rehabilitation

## 2019-02-01 ENCOUNTER — Ambulatory Visit: Payer: Medicare Other | Attending: Adult Health | Admitting: Physical Therapy

## 2019-02-01 ENCOUNTER — Other Ambulatory Visit: Payer: Self-pay

## 2019-02-01 DIAGNOSIS — R2689 Other abnormalities of gait and mobility: Secondary | ICD-10-CM

## 2019-02-01 DIAGNOSIS — M6281 Muscle weakness (generalized): Secondary | ICD-10-CM

## 2019-02-01 DIAGNOSIS — R209 Unspecified disturbances of skin sensation: Secondary | ICD-10-CM | POA: Insufficient documentation

## 2019-02-01 DIAGNOSIS — R208 Other disturbances of skin sensation: Secondary | ICD-10-CM

## 2019-02-01 DIAGNOSIS — R2681 Unsteadiness on feet: Secondary | ICD-10-CM | POA: Diagnosis not present

## 2019-02-02 NOTE — Therapy (Signed)
[facility] [address] [place], [place], [address] Phone: [phone]   Fax:  [phone]  Physical Therapy Evaluation  Patient Details  Name: Miranda Harrington MRN: [id number] Date of Birth: [birth year] Referring Provider (PT): Butch Penny, NP   Encounter Date: 02/01/2019  PT End of Session - 02/02/19 1105    Visit Number  1    Number of Visits  13    Date for PT Re-Evaluation  05/03/19   written for 8 week POC   Authorization Type  Medicare    PT Start Time  1520    PT Stop Time  1605    PT Time Calculation (min)  45 min    Equipment Utilized During Treatment  Gait belt    Activity Tolerance  Patient tolerated treatment well    Behavior During Therapy  [facility] for tasks assessed/performed       Past Medical History:  Diagnosis Date  . Anemia    after a miscarriage  . Anxiety    after stroke  . Arthritis    osteoarthritis  . Carotid artery occlusion   . Hyperlipidemia   . Hypertension   . PHN (postherpetic neuralgia) 03/16/2016  . Sjogren's syndrome (HCC)   . Stroke [facility]) 2008   Right hand / Right foot  Hemorrhagic stroke  . Varicose veins    bleeding episode from varicose vein right posterior calf 07-20-2015      Past Surgical History:  Procedure Laterality Date  . ADENOIDECTOMY  1968  . DILATION AND CURETTAGE OF UTERUS    . EYE SURGERY    . REVERSE SHOULDER ARTHROPLASTY Right 08/20/2016  . REVERSE SHOULDER ARTHROPLASTY Right 08/20/2016   Procedure: REVERSE SHOULDER ARTHROPLASTY;  Surgeon: Francena Hanly, MD;  [facility]: [facility];  Service: Orthopedics;  Laterality: Right;  . STRABISMUS SURGERY Right 09/02/2018   Procedure: REPAIR STRABISMUS RIGHT EYE;  Surgeon: Verne Carrow, MD;  Location: [facility];  Service: Ophthalmology;  Laterality: Right;  . TONSILLECTOMY  1968    There were no vitals filed for this visit.   Subjective Assessment - 02/01/19 1525    Subjective  Saw the  neurologist at the end of October. This past summer noticed that her walking and balance has started getting worse and  has had swelling in both feet and it hasn't really gotten any better, now only on Lyrica per day. Neuropathy and swelling is worse on days where it is cold. Wears compression socks and will elevate her legs. Always used a cane since after her CVA (2008). Has recently noticed her balance and walking has gotten worse. No falls - is very careful.    Pertinent History  Anemia, Anxiety, Arthritis, Carotid artery occlusion, Hyperlipidemia, Hypertension, PHN (postherpetic neuralgia) (03/16/2016), Sjogren's syndrome , Stroke (2008), and Varicose veins.    Limitations  Walking    How long can you walk comfortably?  5-10 minutes.    Patient Stated Goals  wants to make the right leg a little stronger, doesn't want to feel as wobbly, be less fearful of falling    Currently in Pain?  Yes    Pain Score  3     Pain Location  --   pins and needles in right hand and right foot   Pain Orientation  Right    Aggravating Factors   weather - hot weather and when the weather is changing    Pain Relieving Factors  ice, compression stockings    Effect of Pain  on Daily Activities  reports that her right foot feels heavy         [facility] PT Assessment - 02/01/19 1534      Assessment   Medical Diagnosis  gait abnormality, CVA (2008)    Referring Provider (PT)  Butch Penny, NP    Onset Date/Surgical Date  08/31/17    Hand Dominance  Right    Prior Therapy  previous PT for a shoulder replacement 2+ years ago - home health      Precautions   Precautions  Fall      Balance Screen   Has the patient fallen in the past 6 months  No    Has the patient had a decrease in activity level because of a fear of falling?   Yes    Is the patient reluctant to leave their home because of a fear of falling?   Yes      Home Environment   Living Environment  Private residence    Living Arrangements  Alone     Available Help at Discharge  Family    Type of Home  House    Home Access  Level entry    Home Layout  One level    Home Equipment  [place] - single point;Other (comment);Grab bars - toilet;Grab bars - tub/shower   cane with 3 prong   Additional Comments  1 step going down to the garage, 2 steps going down to the backyard - no railings      Prior Function   Level of Independence  Independent    Vocation Requirements  used to be an International aid/development worker at [employer]    Leisure  likes going out to dinner       Observation/Other Assessments   Observations  mild edema B distal LE      Sensation   Light Touch  Impaired Detail;Impaired by gross assessment    Light Touch Impaired Details  Impaired RLE    Proprioception  Appears Intact;Impaired Detail    Proprioception Impaired Details  --   pt reporting incr pins and needles on R   Additional Comments  light touch impaired distally and proximally on R - only can detect at medial thigh and lateral ankle      Coordination   Gross Motor Movements are Fluid and Coordinated  No    Heel Shin Test  slower to perform on RLE      ROM / Strength   AROM / PROM / Strength  Strength      Strength   Strength Assessment Site  Hip;Knee;Ankle    Right/Left Hip  Right;Left    Right Hip Flexion  4-/5    Left Hip Flexion  5/5    Right/Left Knee  Right;Left    Right Knee Flexion  4/5    Right Knee Extension  5/5    Left Knee Flexion  5/5    Left Knee Extension  5/5    Right/Left Ankle  Right;Left    Right Ankle Dorsiflexion  5/5    Left Ankle Dorsiflexion  5/5      Transfers   Five time sit to stand comments   15.03 seconds using BUE from chair    Comments  --      Ambulation/Gait   Ambulation/Gait  Yes    Ambulation/Gait Assistance  5: Supervision;4: Min guard    Ambulation/Gait Assistance Details  Min guard with no AD     Ambulation Distance (Feet)  115  Feet    Assistive device  None;Straight cane   with 3 prong tip   Gait Pattern   Step-through pattern;Decreased step length - left;Decreased stride length;Decreased stance time - right    Ambulation Surface  Level;Indoor    Gait velocity  21.33 seconds = 1.53 ft/sec with SPC, 20.41 seconds = 1.61 ft/sec with no AD     Gait Comments  does not use cane in the house for smaller distances      Standardized Balance Assessment   Standardized Balance Assessment  Berg Balance Test      Berg Balance Test   Sit to Stand  Able to stand  independently using hands    Standing Unsupported  Able to stand safely 2 minutes    Sitting with Back Unsupported but Feet Supported on Floor or Stool  Able to sit safely and securely 2 minutes    Stand to Sit  Controls descent by using hands    Transfers  Able to transfer safely, minor use of hands    Standing Unsupported with Eyes Closed  Able to stand 10 seconds safely    Standing Unsupported with Feet Together  Able to place feet together independently and stand 1 minute safely    From Standing, Reach Forward with Outstretched Arm  Can reach forward >12 cm safely (5")    From Standing Position, Pick up Object from Floor  Able to pick up shoe safely and easily    From Standing Position, Turn to Look Behind Over each Shoulder  Looks behind one side only/other side shows less weight shift    Turn 360 Degrees  Able to turn 360 degrees safely but slowly   9 seconds to R, 4.75 seconds to L   Standing Unsupported, Alternately Place Feet on Step/Stool  Needs assistance to keep from falling or unable to try    Standing Unsupported, One Foot in Front  Able to take small step independently and hold 30 seconds    Standing on One Leg  Unable to try or needs assist to prevent fall    Total Score  40    Berg comment:  40/56                Objective measurements completed on examination: See above findings.              PT Education - 02/02/19 1104    Education Details  clinical findings, POC    Person(s) Educated  Patient     Methods  Explanation    Comprehension  Verbalized understanding       PT Short Term Goals - 02/02/19 1114      PT SHORT TERM GOAL #1   Title  Patient will be independent with initial HEP in order to build upon functional gains in therapy. ALL STGS DUE 03/02/19    Time  4    Period  Weeks    Status  New    Target Date  03/02/19      PT SHORT TERM GOAL #2   Title  Perform ABC scale and TUG - write LTGs as appropriate.    Time  4    Period  Weeks    Status  New      PT SHORT TERM GOAL #3   Title  Patient will improve gait speed with SPC to at least 1.85 ft/sec to indicate decreased fall risk.    Baseline  1.53 ft/sec with SPC - recurrent fall risk category.  Time  4    Period  Weeks    Status  New      PT SHORT TERM GOAL #4   Title  Patient will improve BERG score to at least a 43/56 to indicate decr fall risk.    Baseline  40/56.    Time  4    Period  Weeks    Status  New      PT SHORT TERM GOAL #5   Title  Patient will improve 5x sit <> stand time with BUE support from standard arm chair to 13 seconds or less to demo improved BLE strength.    Baseline  15.03 seconds using BUE from chair.    Time  4    Period  Weeks    Status  New        PT Long Term Goals - 02/02/19 1116      PT LONG TERM GOAL #1   Title  Patient will be independent with final HEP in order to build upon functional gains in therapy. ALL LTGS DUE 03/30/19    Time  8    Period  Weeks    Status  New    Target Date  03/30/19      PT LONG TERM GOAL #2   Title  ABC goal to be written as appropriate - to determine decr fear of falling.    Time  8    Period  Weeks    Status  New      PT LONG TERM GOAL #3   Title  TUG goal to be written as appropriate.    Baseline  not yet assessed.    Time  8    Period  Weeks    Status  New      PT LONG TERM GOAL #4   Title  Patient will improve BERG score to at least a 47/56 to indicate decr fall risk.    Baseline  40/56    Time  8    Period  Weeks     Status  New      PT LONG TERM GOAL #5   Title  Patient will improve gait speed with SPC to at least 2.1 ft/sec to indicate decreased fall risk.    Baseline  1.53 seconds with SPC    Time  8    Period  Weeks    Status  New             Plan - 02/02/19 1107    Clinical Impression Statement  Patient is a 78 year old female referred to Neuro OPPT for evaluation with primary concern of gait abnormalities, imbalance. PMH is significant for: Anemia, Anxiety, Arthritis, Carotid artery occlusion, Hyperlipidemia, Hypertension, PHN (postherpetic neuralgia) (03/16/2016), Sjogren's syndrome , Stroke (2008), and Varicose veins.  The following deficits were present during the exam: decreased functional LE strength, impaired balance, impaired sensation, gait abnormalities, decreased activity tolerance. Pt is fearful of falling.  Pt's BERG and gait speed scores indicate pt is at a higher risk for falls.  Pt would benefit from skilled PT to address these impairments and functional limitations to maximize functional mobility independence    Personal Factors and Comorbidities  Comorbidity 3+;Transportation;Past/Current Experience;Time since onset of injury/illness/exacerbation    Comorbidities  Anemia, Anxiety, Arthritis, Carotid artery occlusion, Hyperlipidemia, Hypertension, PHN (postherpetic neuralgia) (03/16/2016), Sjogren's syndrome , Stroke (2008), and Varicose veins.    Examination-Activity Limitations  Stairs;Locomotion Level    Examination-Participation Restrictions  Community Activity  Stability/Clinical Decision Making  Stable/Uncomplicated    Clinical Decision Making  Low    Rehab Potential  Good    PT Frequency  2x / week   plus an additional 1x week for 4 weeks   PT Duration  4 weeks    PT Treatment/Interventions  ADLs/Self Care Home Management;Aquatic Therapy;DME Instruction;Gait training;Stair training;Functional mobility training;Neuromuscular re-education;Balance training;Therapeutic  exercise;Therapeutic activities;Patient/family education;Energy conservation    PT Next Visit Plan  perform TUG - have Zerek Litsey write goal as appropriate. Perform ABC (to assess confidence with balance, have Jalexia Lalli write goal for this too). initial HEP for LE strengthening and standing balance. esp needs RLE strengthening and balance strategy work. do you think aquatic therapy would be good Angelique Blonderenise?    Consulted and Agree with Plan of Care  Patient       Patient will benefit from skilled therapeutic intervention in order to improve the following deficits and impairments:  Abnormal gait, Decreased activity tolerance, Decreased balance, Decreased endurance, Decreased coordination, Decreased mobility, Difficulty walking, Decreased strength  Visit Diagnosis: Unsteadiness on feet  Muscle weakness (generalized)  Other abnormalities of gait and mobility  Other disturbances of skin sensation     Problem List Patient Active Problem List   Diagnosis Date Noted  . 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 Numbness and tingling in right hand 03/22/2014  . Weakness-Right Hand and Foot 03/22/2014  . Varicose veins of lower extremities with other complications 08/03/2013  . History of hemorrhagic stroke with residual hemiplegia (HCC) 03/17/2013  . Occlusion and stenosis of carotid artery without mention of cerebral infarction 08/11/2012    Drake Leachhloe N Chivonne Rascon, PT, DPT  02/02/2019, 11:21 AM  [facility] [address] [place], [place], [address] Phone: [phone]   Fax:  [phone]  Name: Ames CoupeDiane Shedd MRN: [id number] Date of Birth: August 11, 1940

## 2019-02-03 ENCOUNTER — Ambulatory Visit: Payer: Medicare Other | Admitting: Physical Therapy

## 2019-02-03 ENCOUNTER — Encounter: Payer: Self-pay | Admitting: Physical Therapy

## 2019-02-03 ENCOUNTER — Other Ambulatory Visit: Payer: Self-pay

## 2019-02-03 DIAGNOSIS — R2681 Unsteadiness on feet: Secondary | ICD-10-CM | POA: Diagnosis not present

## 2019-02-03 DIAGNOSIS — R2689 Other abnormalities of gait and mobility: Secondary | ICD-10-CM

## 2019-02-03 DIAGNOSIS — M6281 Muscle weakness (generalized): Secondary | ICD-10-CM

## 2019-02-03 DIAGNOSIS — R208 Other disturbances of skin sensation: Secondary | ICD-10-CM

## 2019-02-03 NOTE — Therapy (Addendum)
[facility] [address] [place], [place], [address] Phone: [phone]   Fax:  [phone]  Physical Therapy Treatment  Patient Details  Name: Miranda Harrington MRN: [id number] Date of Birth: 11-Mar-1940 Referring Provider (PT): Butch PennyMillikan, Megan, NP   Encounter Date: 02/03/2019  PT End of Session - 02/03/19 1602    Visit Number  2    Number of Visits  13    Date for PT Re-Evaluation  05/03/19   written for 8 week POC   Authorization Type  Medicare    PT Start Time  1404    PT Stop Time  1448    PT Time Calculation (min)  44 min    Equipment Utilized During Treatment  Gait belt    Activity Tolerance  Patient tolerated treatment well    Behavior During Therapy  [facility] for tasks assessed/performed       Past Medical History:  Diagnosis Date  . Anemia    after a miscarriage  . Anxiety    after stroke  . Arthritis    osteoarthritis  . Carotid artery occlusion   . Hyperlipidemia   . Hypertension   . PHN (postherpetic neuralgia) 03/16/2016  . Sjogren's syndrome (HCC)   . Stroke [facility]) 2008   Right hand / Right foot  Hemorrhagic stroke  . Varicose veins    bleeding episode from varicose vein right posterior calf 07-20-2015      Past Surgical History:  Procedure Laterality Date  . ADENOIDECTOMY  1968  . DILATION AND CURETTAGE OF UTERUS    . EYE SURGERY    . REVERSE SHOULDER ARTHROPLASTY Right 08/20/2016  . REVERSE SHOULDER ARTHROPLASTY Right 08/20/2016   Procedure: REVERSE SHOULDER ARTHROPLASTY;  Surgeon: Francena HanlySupple, Kevin, MD;  Location: [facility];  Service: Orthopedics;  Laterality: Right;  . STRABISMUS SURGERY Right 09/02/2018   Procedure: REPAIR STRABISMUS RIGHT EYE;  Surgeon: Verne CarrowYoung, William, MD;  Location: [facility];  Service: Ophthalmology;  Laterality: Right;  . TONSILLECTOMY  1968    There were no vitals filed for this visit.  Subjective Assessment - 02/03/19 1411    Subjective  Denies falls or  changes.    Pertinent History  Anemia, Anxiety, Arthritis, Carotid artery occlusion, Hyperlipidemia, Hypertension, PHN (postherpetic neuralgia) (03/16/2016), Sjogren's syndrome , Stroke (2008), and Varicose veins.    Limitations  Walking    How long can you walk comfortably?  5-10 minutes.    Patient Stated Goals  wants to make the right leg a little stronger, doesn't want to feel as wobbly, be less fearful of falling    Currently in Pain?  Yes    Pain Score  2     Pain Location  Foot   and hand   Pain Orientation  Right    Pain Descriptors / Indicators  Pins and needles    Pain Type  Chronic pain    Pain Onset  More than a month ago    Pain Frequency  Constant    Aggravating Factors   hot weather    Pain Relieving Factors  depends on the day          [facility] Treatment/Exercise - 02/03/19 0001      Standardized Balance Assessment   Standardized Balance Assessment  Timed Up and Go Test      Timed Up and Go Test   TUG  Normal TUG    Normal TUG (seconds)  14.75   with cane  High Level Balance   High Level Balance Activities  Other (comment)    High Level Balance Comments  corner balance with eyes open/eyes closed for head turns/nods feet apart vs feet together on compliant and non compliant surface      Exercises   Exercises  Knee/Hip       Had pt complete ABC Scale Scored 41.25  Performed exercises as written in Patient instructions.  Pt using counter for UE support with all exercises. Sit<>stand x 10 with hands on knees and 10 reps with UE support.  Pt tending to lean backward with UE support so recommended hands on knees.  Discussed forward weight shift with sit<>stand.      PT Education - 02/03/19 1601    Education Details  HEP    Person(s) Educated  Patient    Methods  Explanation;Demonstration;Handout    Comprehension  Verbalized understanding;Returned demonstration       PT Short Term Goals - 02/02/19 1114      PT SHORT TERM GOAL #1   Title   Patient will be independent with initial HEP in order to build upon functional gains in therapy. ALL STGS DUE 03/02/19    Time  4    Period  Weeks    Status  New    Target Date  03/02/19      PT SHORT TERM GOAL #2   Title  Perform ABC scale and TUG - write LTGs as appropriate.    Time  4    Period  Weeks    Status  New      PT SHORT TERM GOAL #3   Title  Patient will improve gait speed with SPC to at least 1.85 ft/sec to indicate decreased fall risk.    Baseline  1.53 ft/sec with SPC - recurrent fall risk category.    Time  4    Period  Weeks    Status  New      PT SHORT TERM GOAL #4   Title  Patient will improve BERG score to at least a 43/56 to indicate decr fall risk.    Baseline  40/56.    Time  4    Period  Weeks    Status  New      PT SHORT TERM GOAL #5   Title  Patient will improve 5x sit <> stand time with BUE support from standard arm chair to 13 seconds or less to demo improved BLE strength.    Baseline  15.03 seconds using BUE from chair.    Time  4    Period  Weeks    Status  New        PT Long Term Goals - 02/02/19 1116      PT LONG TERM GOAL #1   Title  Patient will be independent with final HEP in order to build upon functional gains in therapy. ALL LTGS DUE 03/30/19    Time  8    Period  Weeks    Status  New    Target Date  03/30/19      PT LONG TERM GOAL #2   Title  ABC goal to be written as appropriate - to determine decr fear of falling.    Time  8    Period  Weeks    Status  New      PT LONG TERM GOAL #3   Title  TUG goal to be written as appropriate.    Baseline  not yet  assessed.    Time  8    Period  Weeks    Status  New      PT LONG TERM GOAL #4   Title  Patient will improve BERG score to at least a 47/56 to indicate decr fall risk.    Baseline  40/56    Time  8    Period  Weeks    Status  New      PT LONG TERM GOAL #5   Title  Patient will improve gait speed with SPC to at least 2.1 ft/sec to indicate decreased fall risk.     Baseline  1.53 seconds with SPC    Time  8    Period  Weeks    Status  New            Plan - 02/03/19 1602    Clinical Impression Statement  Skilled session focused on developing HEP.  Pt with difficulty with sit<>stand without UE assist as pt tends to lean posterior.  Continue PT per POC.Primary PT: ABC scale administered today for pt to rate self-confidence from a variety of balance activities. Therapist to write LTGs based on this score as well as TUG in to decrease risk of falls and fear of falling.     Personal Factors and Comorbidities  Comorbidity 3+;Transportation;Past/Current Experience;Time since onset of injury/illness/exacerbation    Comorbidities  Anemia, Anxiety, Arthritis, Carotid artery occlusion, Hyperlipidemia, Hypertension, PHN (postherpetic neuralgia) (03/16/2016), Sjogren's syndrome , Stroke (2008), and Varicose veins.    Examination-Activity Limitations  Stairs;Locomotion Level    Examination-Participation Restrictions  Community Activity    Stability/Clinical Decision Making  Stable/Uncomplicated    Rehab Potential  Good    PT Frequency  2x / week   plus an additional 1x week for 4 weeks   PT Duration  4 weeks    PT Treatment/Interventions  ADLs/Self Care Home Management;Aquatic Therapy;DME Instruction;Gait training;Stair training;Functional mobility training;Neuromuscular re-education;Balance training;Therapeutic exercise;Therapeutic activities;Patient/family education;Energy conservation    PT Next Visit Plan  Review HEP and revise as needed.  Corner balance activities on foam.  Give handout on Aquatic PT.    PT Home Exercise Plan  Access Code: [email or web address]    Consulted and Agree with Plan of Care  Patient       Patient will benefit from skilled therapeutic intervention in order to improve the following deficits and impairments:  Abnormal gait, Decreased activity tolerance, Decreased balance, Decreased endurance, Decreased coordination, Decreased mobility,  Difficulty walking, Decreased strength  Visit Diagnosis: Unsteadiness on feet  Muscle weakness (generalized)  Other abnormalities of gait and mobility  Other disturbances of skin sensation     Problem List Patient Active Problem List   Diagnosis Date Noted  . 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 Numbness and tingling in right hand 03/22/2014  . Weakness-Right Hand and Foot 03/22/2014  . Varicose veins of lower extremities with other complications 08/03/2013  . History of hemorrhagic stroke with residual hemiplegia (HCC) 03/17/2013  . Occlusion and stenosis of carotid artery without mention of cerebral infarction 08/11/2012   Newell Coral, PTA [facility] 02/03/19 4:09 PM Phone: [phone] Fax: [phone]   [facility] [address] [place], [place], [address] Phone: [phone]   Fax:  [phone]  Name: Beadie Matsunaga MRN: [id number] Date of Birth: Dec 12, 1940   Sherlie Ban, PT, DPT 02/06/19 7:25 AM

## 2019-02-03 NOTE — Patient Instructions (Signed)
Access Code: [email or web address]  URL: [email or web address]  Date: 02/03/2019  Prepared by: Nita Sells   Exercises  Standing Hip Abduction - 10 reps - 2x daily - 5-7x weekly  Standing Marching - 10 reps - 2x daily - 5-7x weekly  Standing Knee Flexion - 10 reps - 2x daily - 5-7x weekly  Standing Toe Raises at Chair - 10 reps - 2x daily - 5-7x weekly  Standing Hip Extension - 10 reps - 2x daily - 5-7x weekly  Single Leg Stance with Support - 3 reps - 10 hold - 2x daily - 5-7x weekly  Tandem Stance with Support - 3 reps - 10 sec hold - 2x daily - 5-7x weekly

## 2019-02-09 ENCOUNTER — Ambulatory Visit: Payer: Medicare Other | Admitting: Physical Therapy

## 2019-02-14 ENCOUNTER — Other Ambulatory Visit: Payer: Self-pay

## 2019-02-14 ENCOUNTER — Encounter: Payer: Self-pay | Admitting: Physical Therapy

## 2019-02-14 ENCOUNTER — Ambulatory Visit: Payer: Medicare Other | Admitting: Physical Therapy

## 2019-02-14 DIAGNOSIS — M6281 Muscle weakness (generalized): Secondary | ICD-10-CM

## 2019-02-14 DIAGNOSIS — R2681 Unsteadiness on feet: Secondary | ICD-10-CM

## 2019-02-14 DIAGNOSIS — R2689 Other abnormalities of gait and mobility: Secondary | ICD-10-CM

## 2019-02-14 NOTE — Therapy (Signed)
[facility] [address] [place], [place], [address] Phone: [phone]   Fax:  [phone]  Physical Therapy Treatment  Patient Details  Name: Miranda Harrington MRN: [id number] Date of Birth: [birth year] Referring Provider (PT): Ward Givens, NP   Encounter Date: 02/14/2019  PT End of Session - 02/14/19 1510    Visit Number  3    Number of Visits  13    Date for PT Re-Evaluation  05/03/19   written for 8 week POC   Authorization Type  Medicare    PT Start Time  1245    PT Stop Time  1446    PT Time Calculation (min)  44 min    Equipment Utilized During Treatment  Gait belt    Activity Tolerance  Patient tolerated treatment well    Behavior During Therapy  [facility] for tasks assessed/performed       Past Medical History:  Diagnosis Date  . Anemia    after a miscarriage  . Anxiety    after stroke  . Arthritis    osteoarthritis  . Carotid artery occlusion   . Hyperlipidemia   . Hypertension   . PHN (postherpetic neuralgia) 03/16/2016  . Sjogren's syndrome ([place])   . Stroke [facility]) 2008   Right hand / Right foot  Hemorrhagic stroke  . Varicose veins    bleeding episode from varicose vein right posterior calf 07-20-2015      Past Surgical History:  Procedure Laterality Date  . ADENOIDECTOMY  1968  . DILATION AND CURETTAGE OF UTERUS    . EYE SURGERY    . REVERSE SHOULDER ARTHROPLASTY Right 08/20/2016  . REVERSE SHOULDER ARTHROPLASTY Right 08/20/2016   Procedure: REVERSE SHOULDER ARTHROPLASTY;  Surgeon: Justice Britain, MD;  Location: [facility];  Service: Orthopedics;  Laterality: Right;  . STRABISMUS SURGERY Right 09/02/2018   Procedure: REPAIR STRABISMUS RIGHT EYE;  Surgeon: Everitt Amber, MD;  Location: [facility];  Service: Ophthalmology;  Laterality: Right;  . TONSILLECTOMY  1968    There were no vitals filed for this visit.  Subjective Assessment - 02/14/19 1405    Subjective  States that the  right foot was hurting over the weekend - feeling much better now. Reports having pins and needles in her foot -says it feels like someone is in the back of her heels hammering nails. Has had this pain since the stroke. Has increased hip pain from standing hip ABD exercises.    Pertinent History  Anemia, Anxiety, Arthritis, Carotid artery occlusion, Hyperlipidemia, Hypertension, PHN (postherpetic neuralgia) (03/16/2016), Sjogren's syndrome , Stroke (2008), and Varicose veins.    Limitations  Walking    How long can you walk comfortably?  5-10 minutes.    Patient Stated Goals  wants to make the right leg a little stronger, doesn't want to feel as wobbly, be less fearful of falling    Currently in Pain?  Yes    Pain Score  8     Pain Location  Foot    Pain Orientation  Right    Pain Descriptors / Indicators  --   pins and needles   Pain Onset  More than a month ago                       [facility] Adult PT Treatment/Exercise - 02/14/19 0001      Ambulation/Gait   Ambulation/Gait  Yes    Ambulation/Gait Assistance  5: Supervision    Ambulation/Gait Assistance  Details  with SPC throughout session      Knee/Hip Exercises: Standing   Hip Abduction  Both;1 set;5 reps;Other (comment);Knee straight;Stengthening    Abduction Limitations  reviewed exercise for pt's HEP - pt needing verbal and demonstrative cues to perform correctly. when performing with RLE, pt reporting increased R LE pain after the 2nd rep. removed from HEP at this time    Other Standing Knee Exercises  side stepping at // bars with BUE support down and back 2 reps, cues for posture and step length, added to HEP      Knee/Hip Exercises: Sidelying   Clams  2 x 10 reps on R , cues for technique          Balance Exercises - 02/14/19 1518      Balance Exercises: Standing   Other Standing Exercises Comments  On blue foam balance beam: side stepping down and back x3 reps with BUE support, cues for posture. With wide  BOS static stance balance eyes open, progressing to holding for 25-30 seconds x6 reps, pt initially fearful of trying to perform without UE support. With wide BOS 2 x 5 reps head turns and 2 x 5 reps head nods pt needing intermittent fingertip support.         PT Education - 02/14/19 1510    Education Details  revisions to HEP    Person(s) Educated  Patient    Methods  Explanation;Handout    Comprehension  Verbalized understanding;Returned demonstration       PT Short Term Goals - 02/02/19 1114      PT SHORT TERM GOAL #1   Title  Patient will be independent with initial HEP in order to build upon functional gains in therapy. ALL STGS DUE 03/02/19    Time  4    Period  Weeks    Status  New    Target Date  03/02/19      PT SHORT TERM GOAL #2   Title  Perform ABC scale and TUG - write LTGs as appropriate.    Time  4    Period  Weeks    Status  New      PT SHORT TERM GOAL #3   Title  Patient will improve gait speed with SPC to at least 1.85 ft/sec to indicate decreased fall risk.    Baseline  1.53 ft/sec with SPC - recurrent fall risk category.    Time  4    Period  Weeks    Status  New      PT SHORT TERM GOAL #4   Title  Patient will improve BERG score to at least a 43/56 to indicate decr fall risk.    Baseline  40/56.    Time  4    Period  Weeks    Status  New      PT SHORT TERM GOAL #5   Title  Patient will improve 5x sit <> stand time with BUE support from standard arm chair to 13 seconds or less to demo improved BLE strength.    Baseline  15.03 seconds using BUE from chair.    Time  4    Period  Weeks    Status  New        PT Long Term Goals - 02/06/19 0725      PT LONG TERM GOAL #1   Title  Patient will be independent with final HEP in order to build upon functional gains in therapy. ALL LTGS DUE  03/30/19    Time  8    Period  Weeks    Status  New      PT LONG TERM GOAL #2   Title  Patient will improve ABC scale goal by at least 15 points in order to decr  fear of falling.    Baseline  41.25 on 02/03/19    Time  8    Period  Weeks    Status  New      PT LONG TERM GOAL #3   Title  Patient will decrease TUG score with SPC to 12.5 seconds or less in order to decr risk of falling.    Baseline  14.75 seconds with SPC    Time  8    Period  Weeks    Status  New      PT LONG TERM GOAL #4   Title  Patient will improve BERG score to at least a 47/56 to indicate decr fall risk.    Baseline  40/56    Time  8    Period  Weeks    Status  New      PT LONG TERM GOAL #5   Title  Patient will improve gait speed with SPC to at least 2.1 ft/sec to indicate decreased fall risk.    Baseline  1.53 seconds with SPC    Time  8    Period  Weeks    Status  New            Plan - 02/14/19 1513    Clinical Impression Statement  focus of today's skilled session was balance strategies and LE strengthening. Revised standing hip ABD exercise on pt's HEP due to pt reporting increased R leg pain while performing, had pt perform side stepping instead with no increase in pain. Pt initially fearful of balance on blue foam beam, but able to improve static balance with wide BOS with multiple reps. Needed to rest after prolonged standing due to right foot pain. Pt at times during session needs re-direction to the task at hand. Will continue to progress towards LTGs.    Personal Factors and Comorbidities  Comorbidity 3+;Transportation;Past/Current Experience;Time since onset of injury/illness/exacerbation    Comorbidities  Anemia, Anxiety, Arthritis, Carotid artery occlusion, Hyperlipidemia, Hypertension, PHN (postherpetic neuralgia) (03/16/2016), Sjogren's syndrome , Stroke (2008), and Varicose veins.    Examination-Activity Limitations  Stairs;Locomotion Level    Examination-Participation Restrictions  Community Activity    Stability/Clinical Decision Making  Stable/Uncomplicated    Rehab Potential  Good    PT Frequency  2x / week   plus an additional 1x week for 4  weeks   PT Duration  4 weeks    PT Treatment/Interventions  ADLs/Self Care Home Management;Aquatic Therapy;DME Instruction;Gait training;Stair training;Functional mobility training;Neuromuscular re-education;Balance training;Therapeutic exercise;Therapeutic activities;Patient/family education;Energy conservation    PT Next Visit Plan  RLE strengthening in standing and supine/sidelying. Review HEP and revise as needed.  Corner balance activities on foam.    PT Home Exercise Plan  Access Code: [email or web address]    Consulted and Agree with Plan of Care  Patient       Patient will benefit from skilled therapeutic intervention in order to improve the following deficits and impairments:  Abnormal gait, Decreased activity tolerance, Decreased balance, Decreased endurance, Decreased coordination, Decreased mobility, Difficulty walking, Decreased strength  Visit Diagnosis: Muscle weakness (generalized)  Unsteadiness on feet  Other abnormalities of gait and mobility     Problem List Patient Active Problem List   Diagnosis  Date Noted  . 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 Numbness and tingling in right hand 03/22/2014  . Weakness-Right Hand and Foot 03/22/2014  . Varicose veins of lower extremities with other complications 08/03/2013  . History of hemorrhagic stroke with residual hemiplegia (HCC) 03/17/2013  . Occlusion and stenosis of carotid artery without mention of cerebral infarction 08/11/2012    Drake Leach, PT,DPT 02/14/2019, 3:20 PM  [facility] [address] [place], [place], [address] Phone: [phone]   Fax:  [phone]  Name: Miranda Harrington MRN: [id number] Date of Birth: 1940/02/28

## 2019-02-20 ENCOUNTER — Ambulatory Visit: Payer: Medicare Other | Admitting: Physical Therapy

## 2019-02-22 ENCOUNTER — Ambulatory Visit: Payer: Medicare Other | Admitting: Physical Therapy

## 2019-03-06 ENCOUNTER — Ambulatory Visit: Payer: Medicare Other | Admitting: Physical Therapy

## 2019-03-10 ENCOUNTER — Ambulatory Visit: Payer: Medicare Other | Admitting: Physical Therapy

## 2019-03-13 ENCOUNTER — Ambulatory Visit: Payer: Medicare Other | Admitting: Physical Therapy

## 2019-03-17 ENCOUNTER — Ambulatory Visit: Payer: Medicare Other | Admitting: Physical Therapy

## 2019-03-20 ENCOUNTER — Ambulatory Visit: Payer: Medicare Other | Admitting: Physical Therapy

## 2019-03-24 ENCOUNTER — Ambulatory Visit: Payer: Medicare Other | Admitting: Physical Therapy

## 2019-04-14 ENCOUNTER — Encounter: Payer: Self-pay | Admitting: Physical Therapy

## 2019-04-14 NOTE — Therapy (Signed)
[facility] [address] [place], [place], [address] Phone: [phone]   Fax:  [phone]  Patient Details  Name: Miranda Harrington MRN: [id number] Date of Birth: 1940/04/27 Referring Provider:  No ref. provider found  Encounter Date: 04/14/2019   Pt wishes not to return to therapy at this time due to COVID-19.   PHYSICAL THERAPY DISCHARGE SUMMARY  Visits from Start of Care: 4  Current functional level related to goals / functional outcomes: Unable to assess goals.   Remaining deficits: decr LE strength, impaired static and dynamic balance, gait abnormalities.   Education / Equipment: HEP   Plan: Patient agrees to discharge.  Patient goals were not met. Patient is being discharged due to the patient's request.  ?????        Arliss Journey, PT, DPT  04/14/2019, 9:40 AM  [facility] [address] [place], [place], [address] Phone: [phone]   Fax:  [phone]

## 2019-04-21 ENCOUNTER — Ambulatory Visit: Payer: Medicare Other | Attending: Internal Medicine

## 2019-04-21 DIAGNOSIS — Z23 Encounter for immunization: Secondary | ICD-10-CM

## 2019-04-21 NOTE — Progress Notes (Signed)
   Covid-19 Vaccination Clinic  Name:  Miranda Harrington    MRN: [id number] DOB: 1940-08-09  04/21/2019  Miranda Harrington was observed post Covid-19 immunization for 15 minutes without incidence. She was provided with Vaccine Information Sheet and instruction to access the V-Safe system.   Miranda Harrington was instructed to call 911 with any severe reactions post vaccine: Marland Kitchen Difficulty breathing  . Swelling of your face and throat  . A fast heartbeat  . A bad rash all over your body  . Dizziness and weakness    Immunizations Administered    Name Date Dose VIS Date Route   Pfizer COVID-19 Vaccine 04/21/2019 11:19 AM 0.3 mL 02/03/2019 Intramuscular   Manufacturer: [employer], [employer]   Lot: [id number]   NDC: [address]-5

## 2019-04-27 ENCOUNTER — Other Ambulatory Visit: Payer: Self-pay | Admitting: Family Medicine

## 2019-04-27 DIAGNOSIS — Z1231 Encounter for screening mammogram for malignant neoplasm of breast: Secondary | ICD-10-CM

## 2019-05-16 ENCOUNTER — Ambulatory Visit: Payer: Medicare Other | Attending: Internal Medicine

## 2019-05-16 DIAGNOSIS — Z23 Encounter for immunization: Secondary | ICD-10-CM

## 2019-05-16 NOTE — Progress Notes (Signed)
   Covid-19 Vaccination Clinic  Name:  Miranda Harrington    MRN: [id number] DOB: 05-01-1940  05/16/2019  [name] was observed post Covid-19 immunization for 15 minutes without incident. She was provided with Vaccine Information Sheet and instruction to access the V-Safe system.   [name] was instructed to call 911 with any severe reactions post vaccine: Marland Kitchen Difficulty breathing  . Swelling of face and throat  . A fast heartbeat  . A bad rash all over body  . Dizziness and weakness   Immunizations Administered    Name Date Dose VIS Date Route   Pfizer COVID-19 Vaccine 05/16/2019  1:36 PM 0.3 mL 02/03/2019 Intramuscular   Manufacturer: [employer], [employer]   Lot: [id number]   NDC: [address]-4

## 2019-07-07 ENCOUNTER — Ambulatory Visit
Admission: RE | Admit: 2019-07-07 | Discharge: 2019-07-07 | Disposition: A | Discharge status: Home / Self Care | Payer: Medicare Other | Source: Ambulatory Visit | Attending: Family Medicine | Admitting: Family Medicine

## 2019-07-07 ENCOUNTER — Other Ambulatory Visit: Payer: Self-pay

## 2019-07-07 DIAGNOSIS — Z1231 Encounter for screening mammogram for malignant neoplasm of breast: Secondary | ICD-10-CM

## 2019-11-09 ENCOUNTER — Ambulatory Visit (INDEPENDENT_AMBULATORY_CARE_PROVIDER_SITE_OTHER): Payer: Medicare Other | Admitting: Adult Health

## 2019-11-09 ENCOUNTER — Encounter: Payer: Self-pay | Admitting: Adult Health

## 2019-11-09 ENCOUNTER — Other Ambulatory Visit: Payer: Self-pay

## 2019-11-09 VITALS — BP 157/76 | HR 67 | Ht 62.0 in | Wt 183.0 lb

## 2019-11-09 DIAGNOSIS — R519 Headache, unspecified: Secondary | ICD-10-CM | POA: Diagnosis not present

## 2019-11-09 DIAGNOSIS — Z8673 Personal history of transient ischemic attack (TIA), and cerebral infarction without residual deficits: Secondary | ICD-10-CM | POA: Diagnosis not present

## 2019-11-09 DIAGNOSIS — R202 Paresthesia of skin: Secondary | ICD-10-CM

## 2019-11-09 NOTE — Patient Instructions (Signed)
Your Plan:  Continue follow-up with pain management  Blood work today for headache If your symptoms worsen or you develop new symptoms please let us know.   Thank you for coming to see [place] at [facility]. I hope we have been able to provide you high quality care today.  You may receive a patient satisfaction survey over the next few weeks. We would appreciate your feedback and comments so that we may continue to improve ourselves and the health of our patients.

## 2019-11-09 NOTE — Progress Notes (Signed)
PATIENT: Miranda Harrington DOB: 1940-06-01  REASON FOR VISIT: follow up HISTORY FROM: patient  HISTORY OF PRESENT ILLNESS: Today 11/09/19:  Miranda Harrington is a 79 year old female with a history of stroke, parathesias on the right.  She returns today for follow-up.  She reports that she still has swelling in the right ankle.  She also reports paresthesias primarily on the right side.  She continues to see pain management for paresthesias.  She states that she has had a headache above the left eye and in the temporal region for the last 3 weeks.  She typically does not have these type of headaches.  She denies light and noise sensitivity.  Denies nausea or vomiting.  She does report that she has floaters in her vision.  Rates her current headache a 4 out of 10.  She states that Tylenol does take the edge off.  She has not been taken Tylenol daily.  She also reports that her blood pressure has been erratic.  Reports that her PCP has been adjusting her medication.  She returns today for an evaluation.  HISTORY10/28/20:  Miranda Harrington is a 79 year old female with a history of stroke and paresthesias on the right side.  She returns today for follow-up.  She states that Lyrica was no longer helping her paresthesias.  She weaned herself off of this medication.  She is seeing a pain specialist for paresthesias in the right leg.  She has states that she has tried gabapentin and is currently on hydrocodone and neither one of her much benefit.  In the past she has been on amitriptyline but has since weaned off of this medication.  She remains on Zoloft.  She states that during the summer months her right ankle will swell.  She describes sharp shooting pains in the right foot.  She has noticed some changes in her gait.  She denies any falls.  She does use a cane.  She returns today for an evaluation.   REVIEW OF SYSTEMS: Out of a complete 14 system review of symptoms, the patient complains only of the following  symptoms, and all other reviewed systems are negative.  See HPI  ALLERGIES: Allergies  Allergen Reactions  . Bee Venom Anaphylaxis and Hives  . Sulfa Antibiotics Hives  . Demerol [Meperidine] Other (See Comments)    hallucination    HOME MEDICATIONS: Outpatient Medications Prior to Visit  Medication Sig Dispense Refill  . Acetaminophen (TYLENOL PO) Take 2 tablets by mouth daily as needed (headache).    Marland Kitchen aspirin EC 81 MG tablet Take 81 mg by mouth daily.    Marland Kitchen ezetimibe (ZETIA) 10 MG tablet Take 10 mg by mouth daily.    Marland Kitchen golimumab 2 mg/kg in sodium chloride 0.9 % Inject 1 application into the vein. Every other month.    . hydrochlorothiazide (MICROZIDE) 12.5 MG capsule Take 12.5 mg by mouth daily.     Marland Kitchen HYDROcodone-acetaminophen (NORCO) 10-325 MG tablet Take 1 tablet by mouth 3 (three) times daily as needed.    . predniSONE (DELTASONE) 5 MG tablet TAKE AS DIRECTED BY PHYSICIAN OR PACKAGE INSTRUCTIONS.    Marland Kitchen pregabalin (LYRICA) 75 MG capsule TAKE 1 CAPSULE BY MOUTH 2 TIMES DAILY 180 capsule 0  . ramipril (ALTACE) 5 MG tablet Take 5 mg by mouth 2 (two) times daily.     . rosuvastatin (CRESTOR) 40 MG tablet Take 40 mg by mouth at bedtime.     . sertraline (ZOLOFT) 50 MG tablet Take  1 tablet (50 mg total) by mouth daily. 90 tablet 11  . diclofenac (VOLTAREN) 75 MG EC tablet      No facility-administered medications prior to visit.    PAST MEDICAL HISTORY: Past Medical History:  Diagnosis Date  . Anemia    after a miscarriage  . Anxiety    after stroke  . Arthritis    osteoarthritis  . Carotid artery occlusion   . Hyperlipidemia   . Hypertension   . PHN (postherpetic neuralgia) 03/16/2016  . Sjogren's syndrome ([place])   . Stroke [facility]) 2008   Right hand / Right foot  Hemorrhagic stroke  . Varicose veins    bleeding episode from varicose vein right posterior calf 07-20-2015      PAST SURGICAL HISTORY: Past Surgical History:  Procedure Laterality Date  . ADENOIDECTOMY   1968  . DILATION AND CURETTAGE OF UTERUS    . EYE SURGERY    . REVERSE SHOULDER ARTHROPLASTY Right 08/20/2016  . REVERSE SHOULDER ARTHROPLASTY Right 08/20/2016   Procedure: REVERSE SHOULDER ARTHROPLASTY;  Surgeon: Justice Britain, MD;  Location: [facility];  Service: Orthopedics;  Laterality: Right;  . STRABISMUS SURGERY Right 09/02/2018   Procedure: REPAIR STRABISMUS RIGHT EYE;  Surgeon: Everitt Amber, MD;  Location: [facility];  Service: Ophthalmology;  Laterality: Right;  . TONSILLECTOMY  1968    FAMILY HISTORY: Family History  Problem Relation Age of [place]  . Other Mother        varicose veins  . Rheum arthritis Mother   . Cancer Father   . Hypertension Father   . Multiple sclerosis Sister   . Lymphoma Maternal Aunt   . Leukemia Grandchild     SOCIAL HISTORY: Social History   Socioeconomic History  . Marital status: Divorced    Spouse name: Not on file  . Number of children: 3  . Years of education: college  . Highest education level: Not on file  Occupational History  . Not on file  Tobacco Use  . Smoking status: Former Smoker    Types: Cigarettes    Quit date: 08/11/2005    Years since quitting: 14.2  . Smokeless tobacco: Never Used  Vaping Use  . Vaping Use: Never used  Substance and Sexual Activity  . Alcohol use: Yes    Comment: rare  . Drug use: No  . Sexual activity: Not on file  Other Topics Concern  . Not on file  Social History Narrative   Patient is divorced.   Patient has three children.   Patient does not drink any caffeine.   Patient is right-handed.   Patient has a college education.   Social Determinants of Health   Financial Resource Strain:   . Difficulty of Paying Living Expenses: Not on file  Food Insecurity:   . Worried About Charity fundraiser in the Last Year: Not on file  . Ran Out of Food in the Last Year: Not on file  Transportation Needs:   . Lack of Transportation (Medical): Not on file  . Lack of Transportation  (Non-Medical): Not on file  Physical Activity:   . Days of Exercise per Week: Not on file  . Minutes of Exercise per Session: Not on file  Stress:   . Feeling of Stress : Not on file  Social Connections:   . Frequency of Communication with Friends and Family: Not on file  . Frequency of Social Gatherings with Friends and Family: Not on file  . Attends Religious Services:  Not on file  . Active Member of Clubs or Organizations: Not on file  . Attends Archivist Meetings: Not on file  . Marital Status: Not on file  Intimate Partner Violence:   . Fear of Current or Ex-Partner: Not on file  . Emotionally Abused: Not on file  . Physically Abused: Not on file  . Sexually Abused: Not on file      PHYSICAL EXAM  Vitals:   11/09/19 1349  BP: (!) 157/76  Pulse: 67  Weight: 183 lb (83 kg)  Height: 5' 2" (1.575 m)   Body mass index is 33.47 kg/m.  Generalized: Well developed, in no acute distress   Neurological examination  Mentation: Alert oriented to time, place, history taking. Follows all commands speech and language fluent Cranial nerve II-XII: Pupils were equal round reactive to light. Extraocular movements were full, visual field were full on confrontational test. . Head turning and shoulder shrug  were normal and symmetric. Motor: The motor testing reveals 5 over 5 strength of all 4 extremities. Good symmetric motor tone is noted throughout.  Sensory: Sensory testing is intact to soft touch on all 4 extremities. No evidence of extinction is noted.  Coordination: Cerebellar testing reveals good finger-nose-finger and heel-to-shin bilaterally.  Gait and station: Gait is slightly unsteady.  Tandem gait not attempted. Reflexes: Deep tendon reflexes are symmetric and normal bilaterally.   DIAGNOSTIC DATA (LABS, IMAGING, TESTING) - I reviewed patient records, labs, notes, testing and imaging myself where available.  Lab Results  Component Value Date   WBC 6.8  08/13/2016   HGB 13.6 08/13/2016   HCT 43.3 08/13/2016   MCV 91.7 08/13/2016   PLT 150 08/13/2016      Component Value Date/Time   NA 141 08/30/2018 1500   K 4.6 08/30/2018 1500   CL 102 08/30/2018 1500   CO2 30 08/30/2018 1500   GLUCOSE 81 08/30/2018 1500   BUN 28 (H) 08/30/2018 1500   CREATININE 0.76 08/30/2018 1500   CALCIUM 9.5 08/30/2018 1500   GFRNONAA >60 08/30/2018 1500   GFRAA >60 08/30/2018 1500      ASSESSMENT AND PLAN 79 y.o. year old female  has a past medical history of Anemia, Anxiety, Arthritis, Carotid artery occlusion, Hyperlipidemia, Hypertension, PHN (postherpetic neuralgia) (03/16/2016), Sjogren's syndrome ([place]), Stroke ([place]) (2008), and Varicose veins. here with:  Right-sided paresthesias   Continue follow-up with pain management  New onset headache   blood work ESR-CRP  Blood pressure related?--Plans to discuss with her PCP  Pending blood work will decide if further work-up is needed  I spent 30 minutes of face-to-face and non-face-to-face time with patient.  This included previsit chart review, lab review, study review, order entry, electronic health record documentation, patient education.  Ward Givens, MSN, NP-C 11/09/2019, 1:51 PM [facility] [address], [place], [place] [address] [phone]

## 2019-11-10 ENCOUNTER — Other Ambulatory Visit: Payer: Self-pay

## 2019-11-10 ENCOUNTER — Encounter (INDEPENDENT_AMBULATORY_CARE_PROVIDER_SITE_OTHER): Payer: Self-pay | Admitting: Otolaryngology

## 2019-11-10 ENCOUNTER — Ambulatory Visit (INDEPENDENT_AMBULATORY_CARE_PROVIDER_SITE_OTHER): Payer: Medicare Other | Admitting: Otolaryngology

## 2019-11-10 VITALS — Temp 98.1°F

## 2019-11-10 DIAGNOSIS — H6123 Impacted cerumen, bilateral: Secondary | ICD-10-CM | POA: Diagnosis not present

## 2019-11-10 LAB — C-REACTIVE PROTEIN: CRP: <1 mg/L (ref 0–10)

## 2019-11-10 LAB — SEDIMENTATION RATE: Sed Rate: 2 mm/hr (ref 0–40)

## 2019-11-10 NOTE — Progress Notes (Signed)
HPI: Miranda Harrington is a 79 y.o. female who presents for evaluation of wax buildup in in both ears.  She last had them cleaned about a year ago.  She has slight decreased hearing..  Past Medical History:  Diagnosis Date  . Anemia    after a miscarriage  . Anxiety    after stroke  . Arthritis    osteoarthritis  . Carotid artery occlusion   . Hyperlipidemia   . Hypertension   . PHN (postherpetic neuralgia) 03/16/2016  . Sjogren's syndrome (HCC)   . Stroke [facility]) 2008   Right hand / Right foot  Hemorrhagic stroke  . Varicose veins    bleeding episode from varicose vein right posterior calf 07-20-2015     Past Surgical History:  Procedure Laterality Date  . ADENOIDECTOMY  1968  . DILATION AND CURETTAGE OF UTERUS    . EYE SURGERY    . REVERSE SHOULDER ARTHROPLASTY Right 08/20/2016  . REVERSE SHOULDER ARTHROPLASTY Right 08/20/2016   Procedure: REVERSE SHOULDER ARTHROPLASTY;  Surgeon: Francena Hanly, MD;  Location: [facility];  Service: Orthopedics;  Laterality: Right;  . STRABISMUS SURGERY Right 09/02/2018   Procedure: REPAIR STRABISMUS RIGHT EYE;  Surgeon: Verne Carrow, MD;  Location: [facility];  Service: Ophthalmology;  Laterality: Right;  . TONSILLECTOMY  1968   Social History   Socioeconomic History  . Marital status: Divorced    Spouse name: Not on file  . Number of children: 3  . Years of education: college  . Highest education level: Not on file  Occupational History  . Not on file  Tobacco Use  . Smoking status: Former Smoker    Types: Cigarettes    Quit date: 08/11/2005    Years since quitting: 14.2  . Smokeless tobacco: Never Used  Vaping Use  . Vaping Use: Never used  Substance and Sexual Activity  . Alcohol use: Yes    Comment: rare  . Drug use: No  . Sexual activity: Not on file  Other Topics Concern  . Not on file  Social History Narrative   Patient is divorced.   Patient has three children.   Patient does not drink any caffeine.   Patient  is right-handed.   Patient has a college education.   Social Determinants of Health   Financial Resource Strain:   . Difficulty of Paying Living Expenses: Not on file  Food Insecurity:   . Worried About Programme researcher, broadcasting/film/video in the Last Year: Not on file  . Ran Out of Food in the Last Year: Not on file  Transportation Needs:   . Lack of Transportation (Medical): Not on file  . Lack of Transportation (Non-Medical): Not on file  Physical Activity:   . Days of Exercise per Week: Not on file  . Minutes of Exercise per Session: Not on file  Stress:   . Feeling of Stress : Not on file  Social Connections:   . Frequency of Communication with Friends and Family: Not on file  . Frequency of Social Gatherings with Friends and Family: Not on file  . Attends Religious Services: Not on file  . Active Member of Clubs or Organizations: Not on file  . Attends Banker Meetings: Not on file  . Marital Status: Not on file   Family History  Problem Relation Age of Onset  . Other Mother        varicose veins  . Rheum arthritis Mother   . Cancer Father   .  Hypertension Father   . Multiple sclerosis Sister   . Lymphoma Maternal Aunt   . Leukemia Grandchild    Allergies  Allergen Reactions  . Bee Venom Anaphylaxis and Hives  . Sulfa Antibiotics Hives  . Demerol [Meperidine] Other (See Comments)    hallucination   Prior to Admission medications   Medication Sig Start Date End Date Taking? Authorizing Provider  Acetaminophen (TYLENOL PO) Take 2 tablets by mouth daily as needed (headache).   Yes [provider]  aspirin EC 81 MG tablet Take 81 mg by mouth daily.   Yes [provider]  ezetimibe (ZETIA) 10 MG tablet Take 10 mg by mouth daily.   Yes [provider]  golimumab 2 mg/kg in sodium chloride 0.9 % Inject 1 application into the vein. Every other month.   Yes [provider]  hydrochlorothiazide (MICROZIDE) 12.5 MG capsule Take 12.5 mg by  mouth daily.    Yes [provider]  HYDROcodone-acetaminophen (NORCO) 10-325 MG tablet Take 1 tablet by mouth 3 (three) times daily as needed. 12/13/18  Yes [provider]  predniSONE (DELTASONE) 5 MG tablet TAKE AS DIRECTED BY PHYSICIAN OR PACKAGE INSTRUCTIONS. 12/14/18  Yes [provider]  pregabalin (LYRICA) 75 MG capsule TAKE 1 CAPSULE BY MOUTH 2 TIMES DAILY 06/10/17  Yes Butch Penny, NP  ramipril (ALTACE) 5 MG tablet Take 5 mg by mouth 2 (two) times daily.    Yes [provider]  rosuvastatin (CRESTOR) 40 MG tablet Take 40 mg by mouth at bedtime.    Yes [provider]  sertraline (ZOLOFT) 50 MG tablet Take 1 tablet (50 mg total) by mouth daily. 06/10/17  Yes Butch Penny, NP     Positive ROS: Otherwise negative  All other systems have been reviewed and were otherwise negative with the exception of those mentioned in the HPI and as above.  Physical Exam: Constitutional: Alert, well-appearing, no acute distress Ears: External ears without lesions or tenderness. Ear canals wax buildup in both ears that was cleaned with suction and curettes.  TMs were clear bilaterally.. Nasal: External nose without lesions. Clear nasal passages Oral: Oropharynx clear. Neck: No palpable adenopathy or masses Respiratory: Breathing comfortably  Skin: No facial/neck lesions or rash noted.  Cerumen impaction removal  Date/Time: 11/10/2019 2:27 PM Performed by: Drema Halon, MD Authorized by: Drema Halon, MD   Consent:    Consent obtained:  Verbal   Consent given by:  Patient   Risks discussed:  Pain and bleeding Procedure details:    Location:  L ear and R ear   Procedure type: curette and suction   Post-procedure details:    Inspection:  TM intact and canal normal   Hearing quality:  Improved   Patient tolerance of procedure:  Tolerated well, no immediate complications Comments:     TMs are clear  bilaterally    Assessment: Bilateral cerumen impactions  Plan: She will follow up in a year for recheck and cleaning.  Narda Bonds, MD

## 2019-11-14 ENCOUNTER — Telehealth: Payer: Self-pay | Admitting: *Deleted

## 2019-11-14 NOTE — Telephone Encounter (Signed)
Spoke with patient and informed her labs are unremarkable. Patient verbalized understanding, appreciation.' 

## 2020-01-26 ENCOUNTER — Other Ambulatory Visit: Payer: Self-pay

## 2020-01-26 DIAGNOSIS — M792 Neuralgia and neuritis, unspecified: Secondary | ICD-10-CM | POA: Insufficient documentation

## 2020-01-26 DIAGNOSIS — I6529 Occlusion and stenosis of unspecified carotid artery: Secondary | ICD-10-CM

## 2020-01-26 DIAGNOSIS — M1909 Primary osteoarthritis, other specified site: Secondary | ICD-10-CM | POA: Insufficient documentation

## 2020-01-26 DIAGNOSIS — I10 Essential (primary) hypertension: Secondary | ICD-10-CM | POA: Insufficient documentation

## 2020-01-26 DIAGNOSIS — M3500 Sicca syndrome, unspecified: Secondary | ICD-10-CM | POA: Insufficient documentation

## 2020-01-26 DIAGNOSIS — E785 Hyperlipidemia, unspecified: Secondary | ICD-10-CM | POA: Insufficient documentation

## 2020-01-26 DIAGNOSIS — I69959 Hemiplegia and hemiparesis following unspecified cerebrovascular disease affecting unspecified side: Secondary | ICD-10-CM | POA: Insufficient documentation

## 2020-01-26 DIAGNOSIS — M2550 Pain in unspecified joint: Secondary | ICD-10-CM | POA: Insufficient documentation

## 2020-02-05 ENCOUNTER — Ambulatory Visit: Payer: Medicare Other

## 2020-02-05 ENCOUNTER — Encounter (HOSPITAL_COMMUNITY): Payer: Medicare Other

## 2020-04-03 ENCOUNTER — Ambulatory Visit (INDEPENDENT_AMBULATORY_CARE_PROVIDER_SITE_OTHER): Payer: Medicare Other | Admitting: Physician Assistant

## 2020-04-03 ENCOUNTER — Ambulatory Visit (HOSPITAL_COMMUNITY)
Admission: RE | Admit: 2020-04-03 | Discharge: 2020-04-03 | Disposition: A | Discharge status: Home / Self Care | Payer: Medicare Other | Source: Ambulatory Visit | Attending: Vascular Surgery | Admitting: Vascular Surgery

## 2020-04-03 ENCOUNTER — Other Ambulatory Visit: Payer: Self-pay

## 2020-04-03 VITALS — BP 139/76 | HR 60 | Temp 98.7°F | Ht 61.0 in | Wt 182.3 lb

## 2020-04-03 DIAGNOSIS — I6529 Occlusion and stenosis of unspecified carotid artery: Secondary | ICD-10-CM | POA: Insufficient documentation

## 2020-04-03 DIAGNOSIS — I872 Venous insufficiency (chronic) (peripheral): Secondary | ICD-10-CM

## 2020-04-03 NOTE — Progress Notes (Signed)
Office Note     CC:  follow up Requesting Provider:  Cain Saupe, MD  HPI: Miranda Harrington is a 80 y.o. (Jul 22, 1940) female who presents for follow up of carotid artery stenosis. She has a history of hemorrhagic stroke in 2008. She has had no TIA or Stroke since. She has no history of carotid intervention. She has been followed for mild 1-39% bilateral ICA stenosis.  She denies any amaurosis fugax, monocular blindness, slurred speech, facial drooping, weakness of upper or lower extremities  She also has been followed for chronic venous insufficiency and varicose veins. She says overall her symptoms are stable. Her veins are more noticeable now per patient. She wears compression stockings daily but still has swelling in the right greater than left lower extremity. She has not had any recurrent bleeding events  She was recently diagnosed with Rheumatoid Arthritis. She explains she doesn't get around as well because of this. She lives by herself though and is able to do all of her ADLs  The pt is on a statin for cholesterol management as well as Zetia  The pt is on a daily aspirin.   Other AC: none The pt is on ACE and HCTZ for hypertension.   The pt is not diabetic.  Tobacco hx: former, quit 2007  Past Medical History:  Diagnosis Date  . Anemia    after a miscarriage  . Anxiety    after stroke  . Arthritis    osteoarthritis  . Carotid artery occlusion   . Hyperlipidemia   . Hypertension   . PHN (postherpetic neuralgia) 03/16/2016  . Sjogren's syndrome (HCC)   . Stroke [facility]) 2008   Right hand / Right foot  Hemorrhagic stroke  . Varicose veins    bleeding episode from varicose vein right posterior calf 07-20-2015      Past Surgical History:  Procedure Laterality Date  . ADENOIDECTOMY  1968  . DILATION AND CURETTAGE OF UTERUS    . EYE SURGERY    . REVERSE SHOULDER ARTHROPLASTY Right 08/20/2016  . REVERSE SHOULDER ARTHROPLASTY Right 08/20/2016   Procedure: REVERSE SHOULDER  ARTHROPLASTY;  Surgeon: Francena Hanly, MD;  Location: [facility];  Service: Orthopedics;  Laterality: Right;  . STRABISMUS SURGERY Right 09/02/2018   Procedure: REPAIR STRABISMUS RIGHT EYE;  Surgeon: Verne Carrow, MD;  Location: [facility];  Service: Ophthalmology;  Laterality: Right;  . TONSILLECTOMY  1968    Social History   Socioeconomic History  . Marital status: Divorced    Spouse name: Not on file  . Number of children: 3  . Years of education: college  . Highest education level: Not on file  Occupational History  . Not on file  Tobacco Use  . Smoking status: Former Smoker    Types: Cigarettes    Quit date: 08/11/2005    Years since quitting: 14.6  . Smokeless tobacco: Never Used  Vaping Use  . Vaping Use: Never used  Substance and Sexual Activity  . Alcohol use: Yes    Comment: rare  . Drug use: No  . Sexual activity: Not on file  Other Topics Concern  . Not on file  Social History Narrative   Patient is divorced.   Patient has three children.   Patient does not drink any caffeine.   Patient is right-handed.   Patient has a college education.   Social Determinants of Health   Financial Resource Strain: Not on file  Food Insecurity: Not on file  Transportation Needs: Not  on file  Physical Activity: Not on file  Stress: Not on file  Social Connections: Not on file  Intimate Partner Violence: Not on file    Family History  Problem Relation Age of Onset  . Other Mother        varicose veins  . Rheum arthritis Mother   . Cancer Father   . Hypertension Father   . Multiple sclerosis Sister   . Lymphoma Maternal Aunt   . Leukemia Grandchild     Current Outpatient Medications  Medication Sig Dispense Refill  . Acetaminophen (TYLENOL PO) Take 2 tablets by mouth daily as needed (headache).    Marland Kitchen amitriptyline (ELAVIL) 10 MG tablet Take 10 mg by mouth at bedtime.    Marland Kitchen aspirin EC 81 MG tablet Take 81 mg by mouth daily.    Marland Kitchen ezetimibe (ZETIA) 10 MG  tablet Take 10 mg by mouth daily.    Marland Kitchen golimumab 2 mg/kg in sodium chloride 0.9 % Inject 1 application into the vein. Every other month.    . hydrochlorothiazide (MICROZIDE) 12.5 MG capsule Take 12.5 mg by mouth daily.     Marland Kitchen HYDROcodone-acetaminophen (NORCO) 10-325 MG tablet Take 1 tablet by mouth 3 (three) times daily as needed.    . predniSONE (DELTASONE) 5 MG tablet TAKE AS DIRECTED BY PHYSICIAN OR PACKAGE INSTRUCTIONS.    Marland Kitchen pregabalin (LYRICA) 75 MG capsule TAKE 1 CAPSULE BY MOUTH 2 TIMES DAILY (Patient taking differently: daily. TAKE 1 CAPSULE BY MOUTH 2 TIMES DAILY) 180 capsule 0  . ramipril (ALTACE) 5 MG tablet Take 5 mg by mouth 2 (two) times daily.     . rosuvastatin (CRESTOR) 40 MG tablet Take 40 mg by mouth at bedtime.     . sertraline (ZOLOFT) 50 MG tablet Take 1 tablet (50 mg total) by mouth daily. (Patient taking differently: Take 75 mg by mouth daily.) 90 tablet 11   No current facility-administered medications for this visit.    Allergies  Allergen Reactions  . Bee Venom Anaphylaxis and Hives  . Sulfa Antibiotics Hives  . Demerol [Meperidine] Other (See Comments)    hallucination     REVIEW OF SYSTEMS:  [X]  denotes positive finding, [ ]  denotes negative finding Cardiac  Comments:  Chest pain or chest pressure:    Shortness of breath upon exertion:    Short of breath when lying flat:    Irregular heart rhythm:        Vascular    Pain in calf, thigh, or hip brought on by ambulation:    Pain in feet at night that wakes you up from your sleep:     Blood clot in your veins:    Leg swelling:         Pulmonary    Oxygen at home:    Productive cough:     Wheezing:         Neurologic    Sudden weakness in arms or legs:     Sudden numbness in arms or legs:     Sudden onset of difficulty speaking or slurred speech:    Temporary loss of vision in one eye:     Problems with dizziness:         Gastrointestinal    Blood in stool:     Vomited blood:          Genitourinary    Burning when urinating:     Blood in urine:        Psychiatric  Major depression:         Hematologic    Bleeding problems:    Problems with blood clotting too easily:        Skin    Rashes or ulcers:        Constitutional    Fever or chills:      PHYSICAL EXAMINATION:  Vitals:   04/03/20 1335  BP: 139/76  Pulse: 60  Temp: 98.7 F (37.1 C)  TempSrc: Skin  SpO2: 95%  Weight: 182 lb 4.8 oz (82.7 kg)  Height: 5\' 1"  (1.549 m)    General:  WDWN in NAD; vital signs documented above Gait: Normal HENT: WNL, normocephalic Pulmonary: normal non-labored breathing , without wheezing Cardiac: regular HR, without  Murmurs without carotid bruit Abdomen: soft, NT, no masses Vascular Exam/Pulses:  Right Left  Radial 2+ (normal) 2+ (normal)  Femoral 2+ (normal) 2+ (normal)  Popliteal 2+ (normal) 2+ (normal)  DP 2+ (normal) 2+ (normal)  PT 2+ (normal) 2+ (normal)   Extremities: without ischemic changes, without Gangrene , without cellulitis; without open wounds; she does have some swelling of right lower extremity Musculoskeletal: no muscle wasting or atrophy  Neurologic: A&O X 3;  No focal weakness or paresthesias are detected Psychiatric:  The pt has Normal affect.   Non-Invasive Vascular Imaging:   Right ICA: 1-39% stenosis Left ICA : 40-59% stenosis Vertebral: Bilateral vertebral arteries with antegrade flow Subclavians: normal flow hemodynamics in bilateral subclavian arteries   ASSESSMENT/PLAN:: 80 y.o. female here for follow up for carotid artery disease. She has history of hemorrhagic stroke in 2008. She has had no recurrent stroke or TIA. She is without any symptoms. Her duplex today is essentially unchanged. Her left side velocities increased only slightly so she is now in low end of 40-59%. Her venous disease is otherwise stable. She will continue elevation and use of her compression stockings -She will continue her Asprin and Statin -  reviewed signs and symptoms of stroke/ TIA with her and she knows to seek immediate medical attention should these occur -she will follow up in 1 year with repeat carotid duplex   2009, PA-C [facility] [phone]  Clinic MD:  Dr. [phone]

## 2020-06-03 ENCOUNTER — Other Ambulatory Visit: Payer: Self-pay | Admitting: Family Medicine

## 2020-06-03 DIAGNOSIS — Z1231 Encounter for screening mammogram for malignant neoplasm of breast: Secondary | ICD-10-CM

## 2020-08-02 ENCOUNTER — Other Ambulatory Visit: Payer: Self-pay

## 2020-08-02 ENCOUNTER — Ambulatory Visit
Admission: RE | Admit: 2020-08-02 | Discharge: 2020-08-02 | Disposition: A | Discharge status: Home / Self Care | Payer: Medicare Other | Source: Ambulatory Visit | Attending: Family Medicine | Admitting: Family Medicine

## 2020-08-02 DIAGNOSIS — Z1231 Encounter for screening mammogram for malignant neoplasm of breast: Secondary | ICD-10-CM

## 2020-10-01 ENCOUNTER — Other Ambulatory Visit: Payer: Self-pay

## 2020-10-01 ENCOUNTER — Ambulatory Visit (INDEPENDENT_AMBULATORY_CARE_PROVIDER_SITE_OTHER): Payer: Medicare Other | Admitting: Otolaryngology

## 2020-10-01 DIAGNOSIS — H6123 Impacted cerumen, bilateral: Secondary | ICD-10-CM

## 2020-10-01 DIAGNOSIS — I6529 Occlusion and stenosis of unspecified carotid artery: Secondary | ICD-10-CM

## 2020-10-01 NOTE — Progress Notes (Signed)
HPI: Miranda Harrington is a 80 y.o. female who presents for evaluation of wax buildup in her ears..  Past Medical History:  Diagnosis Date   Anemia    after a miscarriage   Anxiety    after stroke   Arthritis    osteoarthritis   Carotid artery occlusion    Hyperlipidemia    Hypertension    PHN (postherpetic neuralgia) 03/16/2016   Sjogren's syndrome (HCC)    Stroke (HCC) 2008   Right hand / Right foot  Hemorrhagic stroke   Varicose veins    bleeding episode from varicose vein right posterior calf 07-20-2015     Past Surgical History:  Procedure Laterality Date   ADENOIDECTOMY  1968   DILATION AND CURETTAGE OF UTERUS     EYE SURGERY     REVERSE SHOULDER ARTHROPLASTY Right 08/20/2016   REVERSE SHOULDER ARTHROPLASTY Right 08/20/2016   Procedure: REVERSE SHOULDER ARTHROPLASTY;  Surgeon: Francena Hanly, MD;  Location: [facility];  Service: Orthopedics;  Laterality: Right;   STRABISMUS SURGERY Right 09/02/2018   Procedure: REPAIR STRABISMUS RIGHT EYE;  Surgeon: Verne Carrow, MD;  Location: [facility];  Service: Ophthalmology;  Laterality: Right;   TONSILLECTOMY  1968   Social History   Socioeconomic History   Marital status: Divorced    Spouse name: Not on file   Number of children: 3   Years of education: college   Highest education level: Not on file  Occupational History   Not on file  Tobacco Use   Smoking status: Former    Types: Cigarettes    Quit date: 08/11/2005    Years since quitting: 15.1   Smokeless tobacco: Never  Vaping Use   Vaping Use: Never used  Substance and Sexual Activity   Alcohol use: Yes    Comment: rare   Drug use: No   Sexual activity: Not on file  Other Topics Concern   Not on file  Social History Narrative   Patient is divorced.   Patient has three children.   Patient does not drink any caffeine.   Patient is right-handed.   Patient has a college education.   Social Determinants of Health   Financial Resource Strain: Not on  file  Food Insecurity: Not on file  Transportation Needs: Not on file  Physical Activity: Not on file  Stress: Not on file  Social Connections: Not on file   Family History  Problem Relation Age of Onset   Other Mother        varicose veins   Rheum arthritis Mother    Cancer Father    Hypertension Father    Multiple sclerosis Sister    Lymphoma Maternal Aunt    Leukemia Grandchild    Allergies  Allergen Reactions   Bee Venom Anaphylaxis and Hives   Sulfa Antibiotics Hives   Demerol [Meperidine] Other (See Comments)    hallucination   Prior to Admission medications   Medication Sig Start Date End Date Taking? Authorizing Provider  Acetaminophen (TYLENOL PO) Take 2 tablets by mouth daily as needed (headache).    [provider]  amitriptyline (ELAVIL) 10 MG tablet Take 10 mg by mouth at bedtime.    [provider]  aspirin EC 81 MG tablet Take 81 mg by mouth daily.    [provider]  ezetimibe (ZETIA) 10 MG tablet Take 10 mg by mouth daily.    [provider]  golimumab 2 mg/kg in sodium chloride 0.9 % Inject 1 application into  the vein. Every other month.    [provider]  hydrochlorothiazide (MICROZIDE) 12.5 MG capsule Take 12.5 mg by mouth daily.     [provider]  HYDROcodone-acetaminophen (NORCO) 10-325 MG tablet Take 1 tablet by mouth 3 (three) times daily as needed. 12/13/18   [provider]  predniSONE (DELTASONE) 5 MG tablet TAKE AS DIRECTED BY PHYSICIAN OR PACKAGE INSTRUCTIONS. 12/14/18   [provider]  pregabalin (LYRICA) 75 MG capsule TAKE 1 CAPSULE BY MOUTH 2 TIMES DAILY Patient taking differently: daily. TAKE 1 CAPSULE BY MOUTH 2 TIMES DAILY 06/10/17   Butch Penny, NP  ramipril (ALTACE) 5 MG tablet Take 5 mg by mouth 2 (two) times daily.     [provider]  rosuvastatin (CRESTOR) 40 MG tablet Take 40 mg by mouth at bedtime.     [provider]  sertraline (ZOLOFT)  50 MG tablet Take 1 tablet (50 mg total) by mouth daily. Patient taking differently: Take 75 mg by mouth daily. 06/10/17   Butch Penny, NP     Positive ROS: Otherwise negative  All other systems have been reviewed and were otherwise negative with the exception of those mentioned in the HPI and as above.  Physical Exam: Constitutional: Alert, well-appearing, no acute distress Ears: External ears without lesions or tenderness. Ear canals mod amount of wax buildup in both ears worse on the right side with a small ear canal on the right side.  This was cleaned with suction and curettes.  TMs were clear bilaterally.. Nasal: External nose without lesions. Clear nasal passages Oral: Oropharynx clear. Neck: No palpable adenopathy or masses Respiratory: Breathing comfortably  Skin: No facial/neck lesions or rash noted.  Cerumen impaction removal  Date/Time: 10/01/2020 5:57 PM Performed by: Drema Halon, MD Authorized by: Drema Halon, MD   Consent:    Consent obtained:  Verbal   Consent given by:  Patient   Risks discussed:  Pain and bleeding Procedure details:    Location:  L ear and R ear   Procedure type: curette and suction   Post-procedure details:    Inspection:  TM intact and canal normal   Hearing quality:  Improved   Procedure completion:  Tolerated well, no immediate complications Comments:     Moderate amount of wax in both ear canals that was cleaned with suction curettes.  TMs were clear bilaterally.  Assessment: Cerumen buildup  Plan: She will follow-up as needed  Narda Bonds, MD

## 2021-01-01 ENCOUNTER — Other Ambulatory Visit: Payer: Self-pay

## 2021-01-01 ENCOUNTER — Encounter: Payer: Self-pay | Admitting: Podiatry

## 2021-01-01 ENCOUNTER — Ambulatory Visit (INDEPENDENT_AMBULATORY_CARE_PROVIDER_SITE_OTHER): Payer: Medicare Other | Admitting: Podiatry

## 2021-01-01 DIAGNOSIS — M21619 Bunion of unspecified foot: Secondary | ICD-10-CM | POA: Diagnosis not present

## 2021-01-01 DIAGNOSIS — M79675 Pain in left toe(s): Secondary | ICD-10-CM | POA: Diagnosis not present

## 2021-01-01 DIAGNOSIS — L600 Ingrowing nail: Secondary | ICD-10-CM

## 2021-01-01 DIAGNOSIS — B351 Tinea unguium: Secondary | ICD-10-CM | POA: Diagnosis not present

## 2021-01-01 DIAGNOSIS — I6529 Occlusion and stenosis of unspecified carotid artery: Secondary | ICD-10-CM | POA: Diagnosis not present

## 2021-01-01 DIAGNOSIS — M79674 Pain in right toe(s): Secondary | ICD-10-CM

## 2021-01-01 NOTE — Progress Notes (Signed)
Subjective:   Patient ID: Miranda Harrington, female   DOB: 80 y.o.   MRN: [id number]   HPI Patient presents stating that she has nail disease 1-5 both feet with probable ingrown toenail they get thick and has structural bunion deformity with pain at times dorsal first metatarsal head right with patient who no longer smokes and is not significantly active and is having trouble with certain shoe gear   Review of Systems  All other systems reviewed and are negative.      Objective:  Physical Exam Vitals and nursing note reviewed.  Constitutional:      Appearance: She is well-developed.  Pulmonary:     Effort: Pulmonary effort is normal.  Musculoskeletal:        General: Normal range of motion.  Skin:    General: Skin is warm.  Neurological:     Mental Status: She is alert.  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

## 2021-03-26 ENCOUNTER — Encounter: Payer: Self-pay | Admitting: Podiatry

## 2021-03-26 ENCOUNTER — Other Ambulatory Visit: Payer: Self-pay

## 2021-03-26 ENCOUNTER — Ambulatory Visit (INDEPENDENT_AMBULATORY_CARE_PROVIDER_SITE_OTHER): Payer: Medicare Other | Admitting: Podiatry

## 2021-03-26 DIAGNOSIS — M79675 Pain in left toe(s): Secondary | ICD-10-CM

## 2021-03-26 DIAGNOSIS — B351 Tinea unguium: Secondary | ICD-10-CM

## 2021-03-26 DIAGNOSIS — M21619 Bunion of unspecified foot: Secondary | ICD-10-CM | POA: Diagnosis not present

## 2021-03-26 DIAGNOSIS — M79674 Pain in right toe(s): Secondary | ICD-10-CM

## 2021-03-26 DIAGNOSIS — M792 Neuralgia and neuritis, unspecified: Secondary | ICD-10-CM

## 2021-03-26 DIAGNOSIS — L600 Ingrowing nail: Secondary | ICD-10-CM

## 2021-03-26 NOTE — Progress Notes (Signed)
This patient returns to my office for at risk foot care.  This patient requires this care by a professional since this patient will be at risk due to having neuropathic pain.  This patient is unable to cut nails herself since the patient cannot reach hiernails.These nails are painful walking and wearing shoes.  This patient presents for at risk foot care today. ? ?General Appearance  Alert, conversant and in no acute stress. ? ?Vascular  Dorsalis pedis and posterior tibial  pulses are palpable  bilaterally.  Capillary return is within normal limits  bilaterally. Temperature is within normal limits  bilaterally. ? ?Neurologic  Senn-Weinstein monofilament wire test within normal limits  bilaterally. Muscle power within normal limits bilaterally. ? ?Nails Thick disfigured discolored nails with subungual debris  from hallux to fifth toes bilaterally. No evidence of bacterial infection or drainage bilaterally. ? ?Orthopedic  No limitations of motion  feet .  No crepitus or effusions noted.  No bony pathology or digital deformities noted. ? ?Skin  normotropic skin with no porokeratosis noted bilaterally.  No signs of infections or ulcers noted.   HAV  and hallux limitus 1st MPJ right foot. ? ?Onychomycosis  Pain in right toes  Pain in left toes ? ?Consent was obtained for treatment procedures.   Mechanical debridement of nails 1-5  bilaterally performed with a nail nipper.  Filed with dremel without incident.  ? ? ?Return office visit    3 months                  Told patient to return for periodic foot care and evaluation due to potential at risk complications. ? ? ?Naren Benally DPM   ?

## 2021-04-14 ENCOUNTER — Other Ambulatory Visit: Payer: Self-pay

## 2021-04-14 DIAGNOSIS — I6529 Occlusion and stenosis of unspecified carotid artery: Secondary | ICD-10-CM

## 2021-04-17 ENCOUNTER — Ambulatory Visit: Payer: Medicare Other

## 2021-04-17 ENCOUNTER — Encounter (HOSPITAL_COMMUNITY): Payer: Medicare Other

## 2021-05-21 NOTE — Progress Notes (Signed)
? ? ?Virtual Visit via Telephone Note ? ? ?I connected with Miranda Harrington on 05/23/2021 using the [employer] by telephone and verified that I was speaking with the correct person using two identifiers. Patient was located at Home. I am located at the [facility] office. ?  ?The limitations of evaluation and management by telemedicine and the availability of in person appointments have been previously discussed with the patient and are documented in the patients chart. The patient expressed understanding and consented to proceed. ? ?CC:  follow up ?Requesting Provider:  Shon Hale, * ? ?HPI: Miranda Harrington is a 81 y.o. ([birth year]) female who presents for follow up of carotid artery stenosis. She has a history of hemorrhagic stroke in 2008. She has had no TIA or Stroke since. She has no history of carotid intervention. She has been followed for mild 1-39% bilateral ICA stenosis. She denies any amaurosis fugax, monocular blindness, slurred speech, facial drooping, weakness of upper or lower extremities ?  ?She also has been followed for chronic venous insufficiency and varicose veins. She says overall her symptoms are stable. She wears compression stockings daily but still has swelling in the right greater than left lower extremity. She has not had any recurrent bleeding events ?  ?She has Rheumatoid Arthritis. She explains she doesn't get around as well because of this. However, she lives by herself though and is able to do all of her ADLs ?  ?The pt is on a statin for cholesterol management as well as Zetia  ?The pt is on a daily aspirin.   Other AC: none ?The pt is on ACE and HCTZ for hypertension.   ?The pt is not diabetic.  ?Tobacco hx: former, quit 2007 ? ?Past Medical History:  ?Diagnosis Date  ? Anemia   ? after a miscarriage  ? Anxiety   ? after stroke  ? Arthritis   ? osteoarthritis  ? Carotid artery occlusion   ? Hyperlipidemia   ? Hypertension   ? PHN (postherpetic neuralgia) 03/16/2016  ? Sjogren's syndrome  (HCC)   ? Stroke [facility]) 2008  ? Right hand / Right foot  Hemorrhagic stroke  ? Varicose veins   ? bleeding episode from varicose vein right posterior calf 07-20-2015    ? ? ?Past Surgical History:  ?Procedure Laterality Date  ? ADENOIDECTOMY  1968  ? DILATION AND CURETTAGE OF UTERUS    ? EYE SURGERY    ? REVERSE SHOULDER ARTHROPLASTY Right 08/20/2016  ? REVERSE SHOULDER ARTHROPLASTY Right 08/20/2016  ? Procedure: REVERSE SHOULDER ARTHROPLASTY;  Surgeon: Francena Hanly, MD;  Location: [facility];  Service: Orthopedics;  Laterality: Right;  ? STRABISMUS SURGERY Right 09/02/2018  ? Procedure: REPAIR STRABISMUS RIGHT EYE;  Surgeon: Verne Carrow, MD;  Location: [facility];  Service: Ophthalmology;  Laterality: Right;  ? TONSILLECTOMY  1968  ? ? ?Social History  ? ?Socioeconomic History  ? Marital status: Divorced  ?  Spouse name: Not on file  ? Number of children: 3  ? Years of education: college  ? Highest education level: Not on file  ?Occupational History  ? Not on file  ?Tobacco Use  ? Smoking status: Former  ?  Types: Cigarettes  ?  Quit date: 08/11/2005  ?  Years since quitting: 15.7  ? Smokeless tobacco: Never  ?Vaping Use  ? Vaping Use: Never used  ?Substance and Sexual Activity  ? Alcohol use: Yes  ?  Comment: rare  ? Drug use: No  ? Sexual activity:  Not on file  ?Other Topics Concern  ? Not on file  ?Social History Narrative  ? Patient is divorced.  ? Patient has three children.  ? Patient does not drink any caffeine.  ? Patient is right-handed.  ? Patient has a college education.  ? ?Social Determinants of Health  ? ?Financial Resource Strain: Not on file  ?Food Insecurity: Not on file  ?Transportation Needs: Not on file  ?Physical Activity: Not on file  ?Stress: Not on file  ?Social Connections: Not on file  ?Intimate Partner Violence: Not on file  ? ? ?Family History  ?Problem Relation Age of Onset  ? Other Mother   ?     varicose veins  ? Rheum arthritis Mother   ? Cancer Father   ? Hypertension  Father   ? Multiple sclerosis Sister   ? Lymphoma Maternal Aunt   ? Leukemia Grandchild   ? ? ?Current Outpatient Medications  ?Medication Sig Dispense Refill  ? Acetaminophen (TYLENOL PO) Take 2 tablets by mouth daily as needed (headache).    ? amitriptyline (ELAVIL) 10 MG tablet Take 10 mg by mouth at bedtime.    ? amitriptyline (ELAVIL) 10 MG tablet 1 tablet at bedtime.    ? amLODipine (NORVASC) 5 MG tablet Take 10 mg by mouth daily.    ? amoxicillin (AMOXIL) 500 MG capsule SMARTSIG:4 Capsule(s) By Mouth    ? ascorbic acid (VITAMIN C) 250 MG tablet 1 tablet    ? aspirin EC 81 MG tablet Take 81 mg by mouth daily.    ? Cholecalciferol 50 MCG (2000 UT) CAPS 2 capsules    ? cyclobenzaprine (FLEXERIL) 10 MG tablet TAKE 1 TABLET BY MOUTH UP TO 2 TIMES DAILY IF NEEDED    ? diclofenac (VOLTAREN) 75 MG EC tablet TAKE 1 TABLET BY MOUTH 2 TIMES DAILY WITH FOOD OR MILK    ? ezetimibe (ZETIA) 10 MG tablet Take 10 mg by mouth daily.    ? golimumab 2 mg/kg in sodium chloride 0.9 % Inject 1 application into the vein. Every other month.    ? hydrochlorothiazide (MICROZIDE) 12.5 MG capsule Take 12.5 mg by mouth daily.     ? HYDROcodone-acetaminophen (NORCO) 10-325 MG tablet Take 1 tablet by mouth 3 (three) times daily as needed.    ? lidocaine (XYLOCAINE) 1 % (with preservative) injection by Infiltration route.    ? Palivizumab (SYNAGIS IM) Inject into the muscle. Once every other month    ? predniSONE (DELTASONE) 5 MG tablet TAKE AS DIRECTED BY PHYSICIAN OR PACKAGE INSTRUCTIONS.    ? pregabalin (LYRICA) 75 MG capsule TAKE 1 CAPSULE BY MOUTH 2 TIMES DAILY (Patient taking differently: daily. TAKE 1 CAPSULE BY MOUTH 2 TIMES DAILY) 180 capsule 0  ? ramipril (ALTACE) 5 MG tablet Take 5 mg by mouth 2 (two) times daily.     ? rosuvastatin (CRESTOR) 40 MG tablet Take 40 mg by mouth at bedtime.     ? sertraline (ZOLOFT) 50 MG tablet 1.5 tab    ? tapentadol (NUCYNTA) 50 MG tablet 1 tablet    ? triamcinolone acetonide (TRIESENCE) 40 MG/ML  SUSP Inject into the articular space.    ? ?No current facility-administered medications for this visit.  ? ? ?Allergies  ?Allergen Reactions  ? Bee Venom Anaphylaxis and Hives  ? Sulfa Antibiotics Hives  ? Erythromycin Hives  ? Other   ?  Other reaction(s): Other (See Comments) ?MYCIN  ? Demerol [Meperidine] Other (See Comments)  ?  hallucination  ?  Hydroxychloroquine Rash  ? ? ? ?REVIEW OF SYSTEMS:  ? ?[X]  denotes positive finding, [ ]  denotes negative finding ?Cardiac  Comments:  ?Chest pain or chest pressure:    ?Shortness of breath upon exertion:    ?Short of breath when lying flat:    ?Irregular heart rhythm:    ?    ?Vascular    ?Pain in calf, thigh, or hip brought on by ambulation:    ?Pain in feet at night that wakes you up from your sleep:     ?Blood clot in your veins:    ?Leg swelling:     ?    ?Pulmonary    ?Oxygen at home:    ?Productive cough:     ?Wheezing:     ?    ?Neurologic    ?Sudden weakness in arms or legs:     ?Sudden numbness in arms or legs:     ?Sudden onset of difficulty speaking or slurred speech:    ?Temporary loss of vision in one eye:     ?Problems with dizziness:     ?    ?Gastrointestinal    ?Blood in stool:     ?Vomited blood:     ?    ?Genitourinary    ?Burning when urinating:     ?Blood in urine:    ?    ?Psychiatric    ?Major depression:     ?    ?Hematologic    ?Bleeding problems:    ?Problems with blood clotting too easily:    ?    ?Skin    ?Rashes or ulcers:    ?    ?Constitutional    ?Fever or chills:    ? ? ?Non-Invasive Vascular Imaging:   ?Summary:  ?Right Carotid: Velocities in the right ICA are consistent with a 1-39% stenosis.  ? ?Left Carotid: Velocities in the left ICA are consistent with a 40-59% stenosis.  ? ?Vertebrals:  Bilateral vertebral arteries demonstrate antegrade flow.  ?Subclavians: Normal flow hemodynamics were seen in bilateral subclavian arteries.  ? ? ?ASSESSMENT/PLAN:: 81 y.o. female here for follow up for Carotid artery stenosis and venous  insufficiency. She has no new symptoms attributable to her carotid stenosis. Her duplex today shows 1-39% right ICA stenosis and 40-59% left ICA stenosis.These are both unchanged from her prior studies. Her verteb

## 2021-05-23 ENCOUNTER — Ambulatory Visit (INDEPENDENT_AMBULATORY_CARE_PROVIDER_SITE_OTHER): Payer: Medicare Other | Admitting: Physician Assistant

## 2021-05-23 ENCOUNTER — Ambulatory Visit (HOSPITAL_COMMUNITY)
Admission: RE | Admit: 2021-05-23 | Discharge: 2021-05-23 | Disposition: A | Discharge status: Home / Self Care | Payer: Medicare Other | Source: Ambulatory Visit | Attending: Physician Assistant | Admitting: Physician Assistant

## 2021-05-23 VITALS — BP 151/78 | HR 59 | Temp 98.0°F | Resp 14 | Ht 61.5 in | Wt 172.0 lb

## 2021-05-23 DIAGNOSIS — I6529 Occlusion and stenosis of unspecified carotid artery: Secondary | ICD-10-CM | POA: Insufficient documentation

## 2021-05-23 DIAGNOSIS — I6523 Occlusion and stenosis of bilateral carotid arteries: Secondary | ICD-10-CM

## 2021-06-25 ENCOUNTER — Encounter: Payer: Self-pay | Admitting: Podiatry

## 2021-06-25 ENCOUNTER — Ambulatory Visit (INDEPENDENT_AMBULATORY_CARE_PROVIDER_SITE_OTHER): Payer: Medicare Other | Admitting: Podiatry

## 2021-06-25 DIAGNOSIS — M79675 Pain in left toe(s): Secondary | ICD-10-CM

## 2021-06-25 DIAGNOSIS — M79674 Pain in right toe(s): Secondary | ICD-10-CM

## 2021-06-25 DIAGNOSIS — M792 Neuralgia and neuritis, unspecified: Secondary | ICD-10-CM | POA: Diagnosis not present

## 2021-06-25 DIAGNOSIS — B351 Tinea unguium: Secondary | ICD-10-CM

## 2021-06-25 DIAGNOSIS — M21619 Bunion of unspecified foot: Secondary | ICD-10-CM | POA: Diagnosis not present

## 2021-06-25 NOTE — Progress Notes (Signed)
This patient returns to my office for at risk foot care.  This patient requires this care by a professional since this patient will be at risk due to having neuropathic pain.  This patient is unable to cut nails herself since the patient cannot reach hiernails.These nails are painful walking and wearing shoes.  This patient presents for at risk foot care today. ? ?General Appearance  Alert, conversant and in no acute stress. ? ?Vascular  Dorsalis pedis and posterior tibial  pulses are palpable  bilaterally.  Capillary return is within normal limits  bilaterally. Temperature is within normal limits  bilaterally. ? ?Neurologic  Senn-Weinstein monofilament wire test within normal limits  bilaterally. Muscle power within normal limits bilaterally. ? ?Nails Thick disfigured discolored nails with subungual debris  from hallux to fifth toes bilaterally. No evidence of bacterial infection or drainage bilaterally. ? ?Orthopedic  No limitations of motion  feet .  No crepitus or effusions noted.  No bony pathology or digital deformities noted. ? ?Skin  normotropic skin with no porokeratosis noted bilaterally.  No signs of infections or ulcers noted.   HAV  and hallux limitus 1st MPJ right foot. ? ?Onychomycosis  Pain in right toes  Pain in left toes ? ?Consent was obtained for treatment procedures.   Mechanical debridement of nails 1-5  bilaterally performed with a nail nipper.  Filed with dremel without incident.  ? ? ?Return office visit    3 months                  Told patient to return for periodic foot care and evaluation due to potential at risk complications. ? ? ?Gardiner Barefoot DPM   ?

## 2021-06-27 ENCOUNTER — Other Ambulatory Visit: Payer: Self-pay | Admitting: Family Medicine

## 2021-06-27 ENCOUNTER — Other Ambulatory Visit: Payer: Self-pay | Admitting: Physician Assistant

## 2021-06-27 DIAGNOSIS — Z1231 Encounter for screening mammogram for malignant neoplasm of breast: Secondary | ICD-10-CM

## 2021-08-04 ENCOUNTER — Ambulatory Visit
Admission: RE | Admit: 2021-08-04 | Discharge: 2021-08-04 | Disposition: A | Discharge status: Home / Self Care | Payer: Medicare Other | Source: Ambulatory Visit | Attending: Family Medicine | Admitting: Family Medicine

## 2021-08-04 DIAGNOSIS — Z1231 Encounter for screening mammogram for malignant neoplasm of breast: Secondary | ICD-10-CM

## 2021-09-24 ENCOUNTER — Ambulatory Visit: Payer: Medicare Other | Admitting: Podiatry

## 2021-10-13 ENCOUNTER — Ambulatory Visit: Payer: Medicare Other | Admitting: Podiatry

## 2021-11-12 ENCOUNTER — Telehealth: Payer: Self-pay | Admitting: *Deleted

## 2021-11-12 NOTE — Patient Outreach (Signed)
  Care Coordination   11/12/2021 Name: Miranda Harrington MRN: [id number] DOB: 07-30-1940   Care Coordination Outreach Attempts:  An unsuccessful telephone outreach was attempted today to offer the patient information about available care coordination services as a benefit of their health plan.   Follow Up Plan:  Additional outreach attempts will be made to offer the patient care coordination information and services.   Encounter Outcome:  No Answer  Care Coordination Interventions Activated:  No   Care Coordination Interventions:  No, not indicated    Raina Mina, RN Care Management Coordinator [place] Office [phone]

## 2021-11-25 ENCOUNTER — Telehealth: Payer: Self-pay | Admitting: *Deleted

## 2021-11-25 NOTE — Patient Outreach (Signed)
  Care Coordination   11/25/2021 Name: Miranda Harrington MRN: [id number] DOB: 11/03/1940   Care Coordination Outreach Attempts:  A second unsuccessful outreach was attempted today to offer the patient with information about available care coordination services as a benefit of their health plan.     Follow Up Plan:  Additional outreach attempts will be made to offer the patient care coordination information and services.   Encounter Outcome:  No Answer  Care Coordination Interventions Activated:  No   Care Coordination Interventions:  No, not indicated    Raina Mina, RN Care Management Coordinator [place] Office [phone]

## 2021-12-08 ENCOUNTER — Telehealth: Payer: Self-pay

## 2021-12-08 NOTE — Patient Outreach (Signed)
  Care Coordination   Initial Visit Note   12/08/2021 Name: Miranda Harrington MRN: [id number] DOB: 26-Oct-1940  Miranda Harrington is a 81 y.o. year old female who sees Lindell Noe, Anastasia Pall, MD for primary care. I spoke with  Markus Daft by phone today.  What matters to the patients health and wellness today?  No concerns today. Pt had Annual Wellness Visit 09/01/21    Goals Addressed             This Visit's Progress    COMPLETED: Care Coordination Activities - no follow up required       Care Coordination Interventions: Provided education to patient re: fall safety, Annual Wellness Visit, care coordination services Assessed social determinant of health barriers Pt had Annual Wellness Visit 09/01/21         SDOH assessments and interventions completed:  Yes  SDOH Interventions Today    Flowsheet Row Most Recent Value  SDOH Interventions   Food Insecurity Interventions Intervention Not Indicated  Housing Interventions Intervention Not Indicated  Transportation Interventions Intervention Not Indicated  Utilities Interventions Intervention Not Indicated  Financial Strain Interventions Intervention Not Indicated  Stress Interventions Intervention Not Indicated        Care Coordination Interventions Activated:  Yes  Care Coordination Interventions:  Yes, provided   Follow up plan: No further intervention required.   Encounter Outcome:  Pt. Visit Completed  Peter Garter RN, BSN,CCM, CDE Care Management Coordinator [place] [facility] [phone]

## 2021-12-08 NOTE — Patient Instructions (Signed)
Visit Information  Thank you for taking time to visit with me today. Please don't hesitate to contact me if I can be of assistance to you.   Following are the goals we discussed today:   Goals Addressed             This Visit's Progress    COMPLETED: Care Coordination Activities - no follow up required       Care Coordination Interventions: Provided education to patient re: fall safety, Annual Wellness Visit, care coordination services Assessed social determinant of health barriers Pt had Annual Wellness Visit 09/01/21         If you are experiencing a Mental Health or [place] or need someone to talk to, please call the Suicide and Crisis Lifeline: 988 call the [place] National Suicide Prevention Lifeline: 607 888 5317 or TTY: (646)158-3088 TTY 757-065-6940) to talk to a trained counselor call 1-800-273-TALK (toll free, 24 hour hotline) go to [facility] [address] [place] [phone]) call 911   The patient verbalized understanding of instructions, educational materials, and care plan provided today and DECLINED offer to receive copy of patient instructions, educational materials, and care plan.   No further follow up required:    Peter Garter RN, Jackquline [place], [place] [facility] [phone]

## 2022-03-09 ENCOUNTER — Encounter (HOSPITAL_COMMUNITY): Payer: Self-pay | Admitting: Family Medicine

## 2022-03-09 DIAGNOSIS — R5383 Other fatigue: Secondary | ICD-10-CM

## 2022-03-10 ENCOUNTER — Other Ambulatory Visit (HOSPITAL_COMMUNITY): Payer: Self-pay | Admitting: Family Medicine

## 2022-03-10 DIAGNOSIS — R0609 Other forms of dyspnea: Secondary | ICD-10-CM

## 2022-04-01 ENCOUNTER — Other Ambulatory Visit (HOSPITAL_COMMUNITY): Payer: Medicare Other

## 2022-05-29 ENCOUNTER — Other Ambulatory Visit: Payer: Self-pay | Admitting: Nurse Practitioner

## 2022-05-29 DIAGNOSIS — M5416 Radiculopathy, lumbar region: Secondary | ICD-10-CM

## 2022-06-26 ENCOUNTER — Other Ambulatory Visit: Payer: Self-pay | Admitting: Family Medicine

## 2022-06-26 DIAGNOSIS — Z1231 Encounter for screening mammogram for malignant neoplasm of breast: Secondary | ICD-10-CM

## 2022-06-30 ENCOUNTER — Ambulatory Visit
Admission: RE | Admit: 2022-06-30 | Discharge: 2022-06-30 | Disposition: A | Discharge status: Home / Self Care | Payer: Medicare Other | Source: Ambulatory Visit | Attending: Nurse Practitioner | Admitting: Nurse Practitioner

## 2022-06-30 DIAGNOSIS — M5416 Radiculopathy, lumbar region: Secondary | ICD-10-CM

## 2022-08-07 ENCOUNTER — Ambulatory Visit
Admission: RE | Admit: 2022-08-07 | Discharge: 2022-08-07 | Disposition: A | Discharge status: Home / Self Care | Payer: Medicare Other | Source: Ambulatory Visit | Attending: Family Medicine | Admitting: Family Medicine

## 2022-08-07 DIAGNOSIS — Z1231 Encounter for screening mammogram for malignant neoplasm of breast: Secondary | ICD-10-CM

## 2022-09-12 ENCOUNTER — Inpatient Hospital Stay: Payer: Medicare Other | Attending: Hematology and Oncology | Admitting: Hematology and Oncology

## 2022-09-12 ENCOUNTER — Inpatient Hospital Stay: Payer: Medicare Other

## 2022-09-12 VITALS — BP 155/70 | HR 69 | Temp 98.4°F | Resp 18 | Ht 61.0 in | Wt 174.7 lb

## 2022-09-12 DIAGNOSIS — D509 Iron deficiency anemia, unspecified: Secondary | ICD-10-CM

## 2022-09-12 DIAGNOSIS — Z809 Family history of malignant neoplasm, unspecified: Secondary | ICD-10-CM | POA: Diagnosis not present

## 2022-09-12 DIAGNOSIS — Z806 Family history of leukemia: Secondary | ICD-10-CM | POA: Insufficient documentation

## 2022-09-12 DIAGNOSIS — Z87891 Personal history of nicotine dependence: Secondary | ICD-10-CM | POA: Insufficient documentation

## 2022-09-12 DIAGNOSIS — Z807 Family history of other malignant neoplasms of lymphoid, hematopoietic and related tissues: Secondary | ICD-10-CM | POA: Insufficient documentation

## 2022-09-12 DIAGNOSIS — Z79899 Other long term (current) drug therapy: Secondary | ICD-10-CM | POA: Insufficient documentation

## 2022-09-12 LAB — DIRECT ANTIGLOBULIN TEST (NOT AT ARMC)
DAT, IgG: NEGATIVE
DAT, complement: NEGATIVE

## 2022-09-12 LAB — IRON AND IRON BINDING CAPACITY (CC-WL,HP ONLY)
Iron: 18 ug/dL — ABNORMAL LOW (ref 28–170)
Saturation Ratios: 3 % — ABNORMAL LOW (ref 10.4–31.8)
TIBC: 532 ug/dL — ABNORMAL HIGH (ref 250–450)
UIBC: 514 ug/dL — ABNORMAL HIGH (ref 148–442)

## 2022-09-12 LAB — CBC WITH DIFFERENTIAL (CANCER CENTER ONLY)
Abs Immature Granulocytes: 0.01 10*3/uL (ref 0.00–0.07)
Basophils Absolute: 0.1 10*3/uL (ref 0.0–0.1)
Basophils Relative: 1 %
Eosinophils Absolute: 0.1 10*3/uL (ref 0.0–0.5)
Eosinophils Relative: 2 %
HCT: 29.1 % — ABNORMAL LOW (ref 36.0–46.0)
Hemoglobin: 8.6 g/dL — ABNORMAL LOW (ref 12.0–15.0)
Immature Granulocytes: 0 %
Lymphocytes Relative: 37 %
Lymphs Abs: 2.1 10*3/uL (ref 0.7–4.0)
MCH: 23.2 pg — ABNORMAL LOW (ref 26.0–34.0)
MCHC: 29.6 g/dL — ABNORMAL LOW (ref 30.0–36.0)
MCV: 78.4 fL — ABNORMAL LOW (ref 80.0–100.0)
Monocytes Absolute: 0.5 10*3/uL (ref 0.1–1.0)
Monocytes Relative: 9 %
Neutro Abs: 2.9 10*3/uL (ref 1.7–7.7)
Neutrophils Relative %: 51 %
Platelet Count: 154 10*3/uL (ref 150–400)
RBC: 3.71 MIL/uL — ABNORMAL LOW (ref 3.87–5.11)
RDW: 15.4 % (ref 11.5–15.5)
WBC Count: 5.7 10*3/uL (ref 4.0–10.5)
nRBC: 0 % (ref 0.0–0.2)

## 2022-09-12 LAB — VITAMIN B12: Vitamin B-12: 908 pg/mL (ref 180–914)

## 2022-09-12 LAB — LACTATE DEHYDROGENASE: LDH: 169 U/L (ref 98–192)

## 2022-09-12 LAB — FERRITIN: Ferritin: 5 ng/mL — ABNORMAL LOW (ref 11–307)

## 2022-09-12 LAB — FOLATE: Folate: 45.8 ng/mL (ref >5.9)

## 2022-09-12 LAB — RETIC PANEL
Immature Retic Fract: 16.9 % — ABNORMAL HIGH (ref 2.3–15.9)
RBC.: 3.71 MIL/uL — ABNORMAL LOW (ref 3.87–5.11)
Retic Count, Absolute: 48.6 10*3/uL (ref 19.0–186.0)
Retic Ct Pct: 1.3 % (ref 0.4–3.1)
Reticulocyte Hemoglobin: 20.6 pg — ABNORMAL LOW (ref >27.9)

## 2022-09-12 NOTE — Progress Notes (Signed)
[facility] CONSULT NOTE  Patient Care Team: Shon Hale, MD as PCP - General (Family Medicine)  CHIEF COMPLAINTS/PURPOSE OF CONSULTATION:  Anemia evaluation  HISTORY OF PRESENTING ILLNESS:  Miranda Harrington 82 y.o. female is here because of recent gnosis of anemia.  Patient has had mild anemia in January 2024.  Most recently in June 2024 labs were repeated and she was found to have worsening anemia with a hemoglobin of 9 and she was referred to [place] for further evaluation.  Iron studies showed an iron saturation of 5%.  She does not report any blood loss in the stools.  She does feel extremely tired and her fatigue has been slowly progressing over the past year.  I reviewed her records extensively and collaborated the history with the patient.     MEDICAL HISTORY:  Past Medical History:  Diagnosis Date   Anemia    after a miscarriage   Anxiety    after stroke   Arthritis    osteoarthritis   Carotid artery occlusion    Hyperlipidemia    Hypertension    PHN (postherpetic neuralgia) 03/16/2016   Sjogren's syndrome (HCC)    Stroke (HCC) 2008   Right hand / Right foot  Hemorrhagic stroke   Varicose veins    bleeding episode from varicose vein right posterior calf 07-20-2015      SURGICAL HISTORY: Past Surgical History:  Procedure Laterality Date   ADENOIDECTOMY  1968   DILATION AND CURETTAGE OF UTERUS     EYE SURGERY     REVERSE SHOULDER ARTHROPLASTY Right 08/20/2016   REVERSE SHOULDER ARTHROPLASTY Right 08/20/2016   Procedure: REVERSE SHOULDER ARTHROPLASTY;  Surgeon: Francena Hanly, MD;  Location: [facility] OR;  Service: Orthopedics;  Laterality: Right;   STRABISMUS SURGERY Right 09/02/2018   Procedure: REPAIR STRABISMUS RIGHT EYE;  Surgeon: Verne Carrow, MD;  Location: [facility];  Service: Ophthalmology;  Laterality: Right;   TONSILLECTOMY  1968    SOCIAL HISTORY: Social History   Socioeconomic History   Marital status: Divorced     Spouse name: Not on file   Number of children: 3   Years of education: college   Highest education level: Not on file  Occupational History   Not on file  Tobacco Use   Smoking status: Former    Current packs/day: 0.00    Types: Cigarettes    Quit date: 08/11/2005    Years since quitting: 17.0   Smokeless tobacco: Never  Vaping Use   Vaping status: Never Used  Substance and Sexual Activity   Alcohol use: Yes    Comment: rare   Drug use: No   Sexual activity: Not on file  Other Topics Concern   Not on file  Social History Narrative   Patient is divorced.   Patient has three children.   Patient does not drink any caffeine.   Patient is right-handed.   Patient has a college education.   Social Determinants of Health   Financial Resource Strain: Low Risk  (12/08/2021)   Overall Financial Resource Strain (CARDIA)    Difficulty of Paying Living Expenses: Not hard at all  Food Insecurity: Low Risk  (08/04/2022)   Received from [facility]   Food vital sign    Within the past 12 months, you worried that your food would run out before you got money to buy more: Never true    Within the past 12 months, the food you bought just didn't last and you didn't  have money to get more. : Never true  Transportation Needs: Not on file (08/04/2022)  Physical Activity: Not on file  Stress: No Stress Concern Present (12/08/2021)   [employer] of Occupational Health - Occupational Stress Questionnaire    Feeling of Stress : Not at all  Social Connections: Not on file  Intimate Partner Violence: Not on file    FAMILY HISTORY: Family History  Problem Relation Age of Onset   Other Mother        varicose veins   Rheum arthritis Mother    Cancer Father    Hypertension Father    Multiple sclerosis Sister    Lymphoma Maternal Aunt    Leukemia Grandchild     ALLERGIES:  is allergic to bee venom, sulfa antibiotics, erythromycin, other, demerol [meperidine], and  hydroxychloroquine.  MEDICATIONS:  Current Outpatient Medications  Medication Sig Dispense Refill   Acetaminophen (TYLENOL PO) Take 2 tablets by mouth daily as needed (headache).     amitriptyline (ELAVIL) 10 MG tablet Take 10 mg by mouth at bedtime.     amitriptyline (ELAVIL) 10 MG tablet 1 tablet at bedtime.     amLODipine (NORVASC) 5 MG tablet Take 10 mg by mouth daily.     amoxicillin (AMOXIL) 500 MG capsule      ascorbic acid (VITAMIN C) 250 MG tablet 1 tablet     Cholecalciferol 50 MCG (2000 UT) CAPS 2 capsules     cyclobenzaprine (FLEXERIL) 10 MG tablet TAKE 1 TABLET BY MOUTH UP TO 2 TIMES DAILY IF NEEDED     ezetimibe (ZETIA) 10 MG tablet Take 10 mg by mouth daily.     golimumab 2 mg/kg in sodium chloride 0.9 % Inject 1 application into the vein. Every other month.     HYDROcodone-acetaminophen (NORCO) 10-325 MG tablet Take 1 tablet by mouth 3 (three) times daily as needed.     lidocaine (XYLOCAINE) 1 % (with preservative) injection by Infiltration route.     Palivizumab (SYNAGIS IM) Inject into the muscle. Once every other month     predniSONE (DELTASONE) 5 MG tablet TAKE AS DIRECTED BY PHYSICIAN OR PACKAGE INSTRUCTIONS.     pregabalin (LYRICA) 75 MG capsule TAKE 1 CAPSULE BY MOUTH 2 TIMES DAILY (Patient taking differently: daily. TAKE 1 CAPSULE BY MOUTH 2 TIMES DAILY) 180 capsule 0   ramipril (ALTACE) 5 MG tablet Take 5 mg by mouth 2 (two) times daily.      rosuvastatin (CRESTOR) 40 MG tablet Take 40 mg by mouth at bedtime.      sertraline (ZOLOFT) 50 MG tablet 1.5 tab     tapentadol (NUCYNTA) 50 MG tablet 1 tablet     triamcinolone acetonide (TRIESENCE) 40 MG/ML SUSP Inject into the articular space.     aspirin EC 81 MG tablet Take 81 mg by mouth daily. (Patient not taking: Reported on 09/12/2022)     diclofenac (VOLTAREN) 75 MG EC tablet TAKE 1 TABLET BY MOUTH 2 TIMES DAILY WITH FOOD OR MILK (Patient not taking: Reported on 09/12/2022)     hydrochlorothiazide (MICROZIDE) 12.5 MG  capsule Take 12.5 mg by mouth daily.  (Patient not taking: Reported on 09/12/2022)     No current facility-administered medications for this visit.    REVIEW OF SYSTEMS:   Constitutional: Denies fevers, chills or abnormal night sweats   All other systems were reviewed with the patient and are negative.  PHYSICAL EXAMINATION: ECOG PERFORMANCE STATUS: 1 - Symptomatic but completely ambulatory  Vitals:   09/12/22 1002  BP: (!) 155/70  Pulse: 69  Resp: 18  Temp: 98.4 F (36.9 C)  SpO2: 96%   Filed Weights   09/12/22 1002  Weight: 174 lb 11.2 oz (79.2 kg)    GENERAL:alert, no distress and comfortable    LABORATORY DATA:  I have reviewed the data as listed Lab Results  Component Value Date   WBC 6.8 08/13/2016   HGB 13.6 08/13/2016   HCT 43.3 08/13/2016   MCV 91.7 08/13/2016   PLT 150 08/13/2016   Lab Results  Component Value Date   NA 141 08/30/2018   K 4.6 08/30/2018   CL 102 08/30/2018   CO2 30 08/30/2018    RADIOGRAPHIC STUDIES: I have personally reviewed the radiological reports and agreed with the findings in the report.  ASSESSMENT AND PLAN:  Iron deficiency anemia Referring physician: Dr. Garth Bigness Lab review: 02/26/2022: Hemoglobin 10.4, MCV 84 08/13/2022: Hemoglobin 9, MCV 80, creatinine 1.02, iron saturation 5%, TIBC 464  Iron deficiency anemia: I discussed with the patient the process of iron absorption and reasons for iron deficiency. I counseled extensively regarding the different causes of iron deficiency including blood loss and malabsorption. Last colonoscopy was 2019 Since the MCV is 88, we will also consider in the differential other causes of normocytic anemia and will work that as well.  Recommendation: Check CBC and iron studies and ferritin, B12 and folic acid as well as reticulocyte count, LDH, SPEP and erythropoietin levels If all the tests are negative and the anemia gets worse we may have to consider doing a bone marrow  biopsy.  Telephone visit next week to discuss her labs  Patient looks amazing for her age and looks to be in excellent condition. I also asked her to get a consultation with her gastroenterologist because of the iron deficiency.  All questions were answered. The patient knows to call the clinic with any problems, questions or concerns.    Tamsen Meek, MD 09/12/22

## 2022-09-12 NOTE — Assessment & Plan Note (Signed)
Referring physician: Dr. Garth Bigness  Iron deficiency anemia: I discussed with the patient the process of iron absorption and reasons for iron deficiency. I counseled extensively regarding the different causes of iron deficiency including blood loss and malabsorption. Last colonoscopy was 2019   Recommendation: 1.  Check CBC and iron studies and ferritin, B12 and folic acid as well 2. will call the patient next week to discuss results and determine if iron infusions are necessary.  Telephone visit next week to discuss her labs

## 2022-09-13 LAB — ERYTHROPOIETIN: Erythropoietin: 45.8 m[IU]/mL — ABNORMAL HIGH (ref 2.6–18.5)

## 2022-09-14 ENCOUNTER — Encounter: Payer: Self-pay | Admitting: Rheumatology

## 2022-09-16 LAB — MULTIPLE MYELOMA PANEL, SERUM
Albumin SerPl Elph-Mcnc: 3.7 g/dL (ref 2.9–4.4)
Albumin/Glob SerPl: 1.4 (ref 0.7–1.7)
Alpha 1: 0.3 g/dL (ref 0.0–0.4)
Alpha2 Glob SerPl Elph-Mcnc: 0.7 g/dL (ref 0.4–1.0)
B-Globulin SerPl Elph-Mcnc: 1 g/dL (ref 0.7–1.3)
Gamma Glob SerPl Elph-Mcnc: 0.7 g/dL (ref 0.4–1.8)
Globulin, Total: 2.8 g/dL (ref 2.2–3.9)
IgA: 108 mg/dL (ref 64–422)
IgG (Immunoglobin G), Serum: 804 mg/dL (ref 586–1602)
IgM (Immunoglobulin M), Srm: 59 mg/dL (ref 26–217)
Total Protein ELP: 6.5 g/dL (ref 6.0–8.5)

## 2022-09-16 NOTE — Progress Notes (Signed)
HEMATOLOGY-ONCOLOGY TELEPHONE VISIT PROGRESS NOTE  I connected with our patient on 09/17/22 at  8:45 AM EDT by telephone and verified that I am speaking with the correct person using two identifiers.  I discussed the limitations, risks, security and privacy concerns of performing an evaluation and management service by telephone and the availability of in person appointments.  I also discussed with the patient that there may be a patient responsible charge related to this service. The patient expressed understanding and agreed to proceed.   History of Present Illness: Miranda Harrington 82 y.o. female is here because of recent gnosis of anemia.  She presents to the clinic for a telephone follow-up to discuss labs.   REVIEW OF SYSTEMS:   Constitutional: Denies fevers, chills or abnormal weight loss All other systems were reviewed with the patient and are negative. Observations/Objective:    Assessment Plan:  Iron deficiency anemia Lab review: 02/26/2022: Hemoglobin 10.4, MCV 84 08/13/2022: Hemoglobin 9, MCV 80, creatinine 1.02, iron saturation 5%, TIBC 464 09/12/2022: Hemoglobin 8.6, MCV 78.4, SPEP: Normal, reticulocyte: 1.3%, reticulocyte hemoglobin 20.6, LDH 169, iron saturation 3%, TIBC 532, ferritin 5, DAT negative, B12: 908, Folate: 45.8, Erythropoietin: 45.8  Based on the above results patient has iron deficiency anemia.  I recommend 3 doses of IV iron over the next 3 weeks.  Telephone follow-up in 2 months with labs done ahead of time    I discussed the assessment and treatment plan with the patient. The patient was provided an opportunity to ask questions and all were answered. The patient agreed with the plan and demonstrated an understanding of the instructions. The patient was advised to call back or seek an in-person evaluation if the symptoms worsen or if the condition fails to improve as anticipated.   I provided 12 minutes of non-face-to-face time during this encounter.  This  includes time for charting and coordination of care   Tamsen Meek, MD  I Janan Ridge am acting as a scribe for Dr.Vinay Gudena  I have reviewed the above documentation for accuracy and completeness, and I agree with the above.

## 2022-09-17 ENCOUNTER — Inpatient Hospital Stay (HOSPITAL_BASED_OUTPATIENT_CLINIC_OR_DEPARTMENT_OTHER): Payer: Medicare Other | Admitting: Hematology and Oncology

## 2022-09-17 ENCOUNTER — Telehealth: Payer: Medicare Other | Admitting: Hematology and Oncology

## 2022-09-17 DIAGNOSIS — D509 Iron deficiency anemia, unspecified: Secondary | ICD-10-CM | POA: Diagnosis not present

## 2022-09-17 NOTE — Assessment & Plan Note (Signed)
Lab review: 02/26/2022: Hemoglobin 10.4, MCV 84 08/13/2022: Hemoglobin 9, MCV 80, creatinine 1.02, iron saturation 5%, TIBC 464 09/12/2022: Hemoglobin 8.6, MCV 78.4, SPEP: Normal, reticulocyte: 1.3%, reticulocyte hemoglobin 20.6, LDH 169, iron saturation 3%, TIBC 532, ferritin 5, DAT negative  Based on the above results patient has iron deficiency anemia. I recommend 3 doses of IV iron over the next 3 weeks.  Telephone follow-up in 2 months with labs done ahead of time

## 2022-09-18 ENCOUNTER — Telehealth: Payer: Self-pay | Admitting: Hematology and Oncology

## 2022-09-18 ENCOUNTER — Telehealth: Payer: Medicare Other | Admitting: Hematology and Oncology

## 2022-09-18 NOTE — Telephone Encounter (Signed)
Scheduled appointments per 7/25 los. Patient is aware of made appointments.

## 2022-09-30 ENCOUNTER — Telehealth: Payer: Self-pay | Admitting: Hematology and Oncology

## 2022-10-01 ENCOUNTER — Inpatient Hospital Stay: Payer: Medicare Other

## 2022-10-02 ENCOUNTER — Other Ambulatory Visit: Payer: Self-pay

## 2022-10-02 ENCOUNTER — Inpatient Hospital Stay: Payer: Medicare Other | Attending: Hematology and Oncology

## 2022-10-02 VITALS — BP 125/65 | HR 69 | Temp 98.8°F | Resp 18 | Wt 175.0 lb

## 2022-10-02 DIAGNOSIS — D509 Iron deficiency anemia, unspecified: Secondary | ICD-10-CM | POA: Diagnosis not present

## 2022-10-02 MED ORDER — SODIUM CHLORIDE 0.9 % IV SOLN: Freq: Once | INTRAVENOUS | Status: AC

## 2022-10-02 MED ORDER — SODIUM CHLORIDE 0.9 % IV SOLN
300.0000 mg | Freq: Once | INTRAVENOUS | Status: AC
  Administered 2022-10-02: 300 mg via INTRAVENOUS
  Filled 2022-10-02: qty 300

## 2022-10-02 NOTE — Patient Instructions (Signed)
 Iron Sucrose Injection What is this medication? IRON SUCROSE (EYE ern SOO krose) treats low levels of iron (iron deficiency anemia) in people with kidney disease. Iron is a mineral that plays an important role in making red blood cells, which carry oxygen from your lungs to the rest of your body. This medicine may be used for other purposes; ask your health care provider or pharmacist if you have questions. COMMON BRAND NAME(S): Venofer What should I tell my care team before I take this medication? They need to know if you have any of these conditions: Anemia not caused by low iron levels Heart disease High levels of iron in the blood Kidney disease Liver disease An unusual or allergic reaction to iron, other medications, foods, dyes, or preservatives Pregnant or trying to get pregnant Breastfeeding How should I use this medication? This medication is for infusion into a vein. It is given in a hospital or clinic setting. Talk to your care team about the use of this medication in children. While this medication may be prescribed for children as young as 2 years for selected conditions, precautions do apply. Overdosage: If you think you have taken too much of this medicine contact a poison control center or emergency room at once. NOTE: This medicine is only for you. Do not share this medicine with others. What if I miss a dose? Keep appointments for follow-up doses. It is important not to miss your dose. Call your care team if you are unable to keep an appointment. What may interact with this medication? Do not take this medication with any of the following: Deferoxamine Dimercaprol Other iron products This medication may also interact with the following: Chloramphenicol Deferasirox This list may not describe all possible interactions. Give your health care provider a list of all the medicines, herbs, non-prescription drugs, or dietary supplements you use. Also tell them if you smoke,  drink alcohol, or use illegal drugs. Some items may interact with your medicine. What should I watch for while using this medication? Visit your care team regularly. Tell your care team if your symptoms do not start to get better or if they get worse. You may need blood work done while you are taking this medication. You may need to follow a special diet. Talk to your care team. Foods that contain iron include: whole grains/cereals, dried fruits, beans, or peas, leafy green vegetables, and organ meats (liver, kidney). What side effects may I notice from receiving this medication? Side effects that you should report to your care team as soon as possible: Allergic reactions--skin rash, itching, hives, swelling of the face, lips, tongue, or throat Low blood pressure--dizziness, feeling faint or lightheaded, blurry vision Shortness of breath Side effects that usually do not require medical attention (report to your care team if they continue or are bothersome): Flushing Headache Joint pain Muscle pain Nausea Pain, redness, or irritation at injection site This list may not describe all possible side effects. Call your doctor for medical advice about side effects. You may report side effects to FDA at 1-800-FDA-1088. Where should I keep my medication? This medication is given in a hospital or clinic. It will not be stored at home. NOTE: This sheet is a summary. It may not cover all possible information. If you have questions about this medicine, talk to your doctor, pharmacist, or health care provider.  2024 Elsevier/Gold Standard (2022-07-17 00:00:00)

## 2022-10-08 ENCOUNTER — Inpatient Hospital Stay: Payer: Medicare Other

## 2022-10-08 ENCOUNTER — Other Ambulatory Visit: Payer: Self-pay

## 2022-10-08 VITALS — BP 113/52 | HR 63 | Temp 98.2°F | Resp 18

## 2022-10-08 DIAGNOSIS — D509 Iron deficiency anemia, unspecified: Secondary | ICD-10-CM | POA: Diagnosis not present

## 2022-10-08 MED ORDER — SODIUM CHLORIDE 0.9 % IV SOLN: Freq: Once | INTRAVENOUS | Status: AC

## 2022-10-08 MED ORDER — SODIUM CHLORIDE 0.9 % IV SOLN
300.0000 mg | Freq: Once | INTRAVENOUS | Status: AC
  Administered 2022-10-08: 300 mg via INTRAVENOUS
  Filled 2022-10-08: qty 300

## 2022-10-08 NOTE — Progress Notes (Signed)
Patient remained for 15 minutes post iron observation due to transportation. VSS and patient reported she felt well at discharge. Patient ambulatory to lobby to meet son.

## 2022-10-08 NOTE — Patient Instructions (Signed)
 Iron Sucrose Injection What is this medication? IRON SUCROSE (EYE ern SOO krose) treats low levels of iron (iron deficiency anemia) in people with kidney disease. Iron is a mineral that plays an important role in making red blood cells, which carry oxygen from your lungs to the rest of your body. This medicine may be used for other purposes; ask your health care provider or pharmacist if you have questions. COMMON BRAND NAME(S): Venofer What should I tell my care team before I take this medication? They need to know if you have any of these conditions: Anemia not caused by low iron levels Heart disease High levels of iron in the blood Kidney disease Liver disease An unusual or allergic reaction to iron, other medications, foods, dyes, or preservatives Pregnant or trying to get pregnant Breastfeeding How should I use this medication? This medication is for infusion into a vein. It is given in a hospital or clinic setting. Talk to your care team about the use of this medication in children. While this medication may be prescribed for children as young as 2 years for selected conditions, precautions do apply. Overdosage: If you think you have taken too much of this medicine contact a poison control center or emergency room at once. NOTE: This medicine is only for you. Do not share this medicine with others. What if I miss a dose? Keep appointments for follow-up doses. It is important not to miss your dose. Call your care team if you are unable to keep an appointment. What may interact with this medication? Do not take this medication with any of the following: Deferoxamine Dimercaprol Other iron products This medication may also interact with the following: Chloramphenicol Deferasirox This list may not describe all possible interactions. Give your health care provider a list of all the medicines, herbs, non-prescription drugs, or dietary supplements you use. Also tell them if you smoke,  drink alcohol, or use illegal drugs. Some items may interact with your medicine. What should I watch for while using this medication? Visit your care team regularly. Tell your care team if your symptoms do not start to get better or if they get worse. You may need blood work done while you are taking this medication. You may need to follow a special diet. Talk to your care team. Foods that contain iron include: whole grains/cereals, dried fruits, beans, or peas, leafy green vegetables, and organ meats (liver, kidney). What side effects may I notice from receiving this medication? Side effects that you should report to your care team as soon as possible: Allergic reactions--skin rash, itching, hives, swelling of the face, lips, tongue, or throat Low blood pressure--dizziness, feeling faint or lightheaded, blurry vision Shortness of breath Side effects that usually do not require medical attention (report to your care team if they continue or are bothersome): Flushing Headache Joint pain Muscle pain Nausea Pain, redness, or irritation at injection site This list may not describe all possible side effects. Call your doctor for medical advice about side effects. You may report side effects to FDA at 1-800-FDA-1088. Where should I keep my medication? This medication is given in a hospital or clinic. It will not be stored at home. NOTE: This sheet is a summary. It may not cover all possible information. If you have questions about this medicine, talk to your doctor, pharmacist, or health care provider.  2024 Elsevier/Gold Standard (2022-07-17 00:00:00)

## 2022-10-14 ENCOUNTER — Encounter: Payer: Self-pay | Admitting: Internal Medicine

## 2022-10-16 ENCOUNTER — Inpatient Hospital Stay: Payer: Medicare Other

## 2022-10-16 VITALS — BP 156/61 | HR 62 | Temp 98.2°F | Resp 17

## 2022-10-16 DIAGNOSIS — D509 Iron deficiency anemia, unspecified: Secondary | ICD-10-CM | POA: Diagnosis not present

## 2022-10-16 MED ORDER — SODIUM CHLORIDE 0.9 % IV SOLN: Freq: Once | INTRAVENOUS | Status: AC

## 2022-10-16 MED ORDER — SODIUM CHLORIDE 0.9 % IV SOLN
300.0000 mg | Freq: Once | INTRAVENOUS | Status: AC
  Administered 2022-10-16: 300 mg via INTRAVENOUS
  Filled 2022-10-16: qty 300

## 2022-10-16 NOTE — Patient Instructions (Signed)
 Iron Sucrose Injection What is this medication? IRON SUCROSE (EYE ern SOO krose) treats low levels of iron (iron deficiency anemia) in people with kidney disease. Iron is a mineral that plays an important role in making red blood cells, which carry oxygen from your lungs to the rest of your body. This medicine may be used for other purposes; ask your health care provider or pharmacist if you have questions. COMMON BRAND NAME(S): Venofer What should I tell my care team before I take this medication? They need to know if you have any of these conditions: Anemia not caused by low iron levels Heart disease High levels of iron in the blood Kidney disease Liver disease An unusual or allergic reaction to iron, other medications, foods, dyes, or preservatives Pregnant or trying to get pregnant Breastfeeding How should I use this medication? This medication is for infusion into a vein. It is given in a hospital or clinic setting. Talk to your care team about the use of this medication in children. While this medication may be prescribed for children as young as 2 years for selected conditions, precautions do apply. Overdosage: If you think you have taken too much of this medicine contact a poison control center or emergency room at once. NOTE: This medicine is only for you. Do not share this medicine with others. What if I miss a dose? Keep appointments for follow-up doses. It is important not to miss your dose. Call your care team if you are unable to keep an appointment. What may interact with this medication? Do not take this medication with any of the following: Deferoxamine Dimercaprol Other iron products This medication may also interact with the following: Chloramphenicol Deferasirox This list may not describe all possible interactions. Give your health care provider a list of all the medicines, herbs, non-prescription drugs, or dietary supplements you use. Also tell them if you smoke,  drink alcohol, or use illegal drugs. Some items may interact with your medicine. What should I watch for while using this medication? Visit your care team regularly. Tell your care team if your symptoms do not start to get better or if they get worse. You may need blood work done while you are taking this medication. You may need to follow a special diet. Talk to your care team. Foods that contain iron include: whole grains/cereals, dried fruits, beans, or peas, leafy green vegetables, and organ meats (liver, kidney). What side effects may I notice from receiving this medication? Side effects that you should report to your care team as soon as possible: Allergic reactions--skin rash, itching, hives, swelling of the face, lips, tongue, or throat Low blood pressure--dizziness, feeling faint or lightheaded, blurry vision Shortness of breath Side effects that usually do not require medical attention (report to your care team if they continue or are bothersome): Flushing Headache Joint pain Muscle pain Nausea Pain, redness, or irritation at injection site This list may not describe all possible side effects. Call your doctor for medical advice about side effects. You may report side effects to FDA at 1-800-FDA-1088. Where should I keep my medication? This medication is given in a hospital or clinic. It will not be stored at home. NOTE: This sheet is a summary. It may not cover all possible information. If you have questions about this medicine, talk to your doctor, pharmacist, or health care provider.  2024 Elsevier/Gold Standard (2022-07-17 00:00:00)

## 2022-12-18 ENCOUNTER — Inpatient Hospital Stay: Payer: Medicare Other | Attending: Hematology and Oncology

## 2022-12-18 DIAGNOSIS — D509 Iron deficiency anemia, unspecified: Secondary | ICD-10-CM | POA: Diagnosis present

## 2022-12-18 LAB — CBC WITH DIFFERENTIAL (CANCER CENTER ONLY)
Abs Immature Granulocytes: 0.01 10*3/uL (ref 0.00–0.07)
Basophils Absolute: 0 10*3/uL (ref 0.0–0.1)
Basophils Relative: 1 %
Eosinophils Absolute: 0.1 10*3/uL (ref 0.0–0.5)
Eosinophils Relative: 1 %
HCT: 37.8 % (ref 36.0–46.0)
Hemoglobin: 11.8 g/dL — ABNORMAL LOW (ref 12.0–15.0)
Immature Granulocytes: 0 %
Lymphocytes Relative: 31 %
Lymphs Abs: 2 10*3/uL (ref 0.7–4.0)
MCH: 26.5 pg (ref 26.0–34.0)
MCHC: 31.2 g/dL (ref 30.0–36.0)
MCV: 84.8 fL (ref 80.0–100.0)
Monocytes Absolute: 0.6 10*3/uL (ref 0.1–1.0)
Monocytes Relative: 10 %
Neutro Abs: 3.7 10*3/uL (ref 1.7–7.7)
Neutrophils Relative %: 57 %
Platelet Count: 166 10*3/uL (ref 150–400)
RBC: 4.46 MIL/uL (ref 3.87–5.11)
RDW: 20.9 % — ABNORMAL HIGH (ref 11.5–15.5)
WBC Count: 6.5 10*3/uL (ref 4.0–10.5)
nRBC: 0 % (ref 0.0–0.2)

## 2022-12-18 LAB — IRON AND IRON BINDING CAPACITY (CC-WL,HP ONLY)
Iron: 56 ug/dL (ref 28–170)
Saturation Ratios: 15 % (ref 10.4–31.8)
TIBC: 372 ug/dL (ref 250–450)
UIBC: 316 ug/dL (ref 148–442)

## 2022-12-18 LAB — FERRITIN: Ferritin: 72 ng/mL (ref 11–307)

## 2022-12-21 ENCOUNTER — Inpatient Hospital Stay (HOSPITAL_BASED_OUTPATIENT_CLINIC_OR_DEPARTMENT_OTHER): Payer: Medicare Other | Admitting: Hematology and Oncology

## 2022-12-21 DIAGNOSIS — D509 Iron deficiency anemia, unspecified: Secondary | ICD-10-CM | POA: Diagnosis not present

## 2022-12-21 NOTE — Progress Notes (Signed)
HEMATOLOGY-ONCOLOGY TELEPHONE VISIT PROGRESS NOTE  I connected with our patient on 12/21/22 at 10:15 AM EDT by telephone and verified that I am speaking with the correct person using two identifiers.  I discussed the limitations, risks, security and privacy concerns of performing an evaluation and management service by telephone and the availability of in person appointments.  I also discussed with the patient that there may be a patient responsible charge related to this service. The patient expressed understanding and agreed to proceed.   History of Present Illness: Follow-up after finishing IV iron therapy Discussed the use of AI scribe software for clinical note transcription with the patient, who gave verbal consent to proceed.  History of Present Illness   The patient, with a history of anemia and Barrett's esophagus, reports improvement in her anemia after receiving IV iron. She is pleased with the improvement in her hemoglobin from 8.6 to 11.8 and ferritin to 72. She is not currently requiring any additional iron.  She also discusses her Barrett's esophagus, which is being managed by a team at [facility]. She expresses concern that the condition may have progressed to cancer. She expresses apprehension about the potential side effects of the treatment and plans to evaluate her condition after the first treatment.        REVIEW OF SYSTEMS:   Constitutional: Denies fevers, chills or abnormal weight loss All other systems were reviewed with the patient and are negative. Observations/Objective:     Assessment Plan:  Iron deficiency anemia Lab review: 02/26/2022: Hemoglobin 10.4, MCV 84 08/13/2022: Hemoglobin 9, MCV 80, creatinine 1.02, iron saturation 5%, TIBC 464 09/12/2022: Hemoglobin 8.6, MCV 78.4, SPEP: Normal, reticulocyte: 1.3%, reticulocyte hemoglobin 20.6, LDH 169, iron saturation 3%, TIBC 532, ferritin 5, DAT negative, B12: 908, Folate: 45.8, Erythropoietin: 45.8 12/18/2022:  Hemoglobin 11.8, MCV 84.8, platelets 166, iron saturation 15%, ferritin 72   Based on the above results patient does not need any additional IV iron therapy.  She demonstrated excellent response to treatment.   Barretts Esophagus: Seeing [facility]. Endoscopy being planned and cauterization of Barretts. (Nov 2024)  Re-check labs in 3 months and telephone visit after that.   I discussed the assessment and treatment plan with the patient. The patient was provided an opportunity to ask questions and all were answered. The patient agreed with the plan and demonstrated an understanding of the instructions. The patient was advised to call back or seek an in-person evaluation if the symptoms worsen or if the condition fails to improve as anticipated.   I provided 12 minutes of non-face-to-face time during this encounter.  This includes time for charting and coordination of care   Tamsen Meek, MD

## 2022-12-21 NOTE — Assessment & Plan Note (Addendum)
Lab review: 02/26/2022: Hemoglobin 10.4, MCV 84 08/13/2022: Hemoglobin 9, MCV 80, creatinine 1.02, iron saturation 5%, TIBC 464 09/12/2022: Hemoglobin 8.6, MCV 78.4, SPEP: Normal, reticulocyte: 1.3%, reticulocyte hemoglobin 20.6, LDH 169, iron saturation 3%, TIBC 532, ferritin 5, DAT negative, B12: 908, Folate: 45.8, Erythropoietin: 45.8 12/18/2022: Hemoglobin 11.8, MCV 84.8, platelets 166, iron saturation 15%, ferritin 72   Based on the above results patient does not need any additional IV iron therapy.  She demonstrated excellent response to treatment.   Barretts Esophagus: Seeing [facility]. Endoscopy being planned and cauterization of Barretts. (Nov 2024)  Re-check labs in 3 months and telephone visit after that.

## 2022-12-22 ENCOUNTER — Telehealth: Payer: Self-pay | Admitting: Hematology and Oncology

## 2022-12-22 NOTE — Telephone Encounter (Signed)
Per Pamelia Hoit 10/28 los patient is aware of scheduled appointment times/dates

## 2023-01-16 ENCOUNTER — Ambulatory Visit (HOSPITAL_COMMUNITY)
Admission: EM | Admit: 2023-01-16 | Discharge: 2023-01-17 | Disposition: A | Discharge status: Home / Self Care | Payer: Medicare Other | Attending: Gastroenterology | Admitting: Gastroenterology

## 2023-01-16 ENCOUNTER — Emergency Department (HOSPITAL_COMMUNITY): Payer: Medicare Other

## 2023-01-16 ENCOUNTER — Other Ambulatory Visit: Payer: Self-pay

## 2023-01-16 DIAGNOSIS — M1990 Unspecified osteoarthritis, unspecified site: Secondary | ICD-10-CM | POA: Insufficient documentation

## 2023-01-16 DIAGNOSIS — K449 Diaphragmatic hernia without obstruction or gangrene: Secondary | ICD-10-CM | POA: Diagnosis not present

## 2023-01-16 DIAGNOSIS — W44F3XA Food entering into or through a natural orifice, initial encounter: Secondary | ICD-10-CM | POA: Insufficient documentation

## 2023-01-16 DIAGNOSIS — R1310 Dysphagia, unspecified: Secondary | ICD-10-CM | POA: Diagnosis not present

## 2023-01-16 DIAGNOSIS — M3500 Sicca syndrome, unspecified: Secondary | ICD-10-CM | POA: Diagnosis not present

## 2023-01-16 DIAGNOSIS — E785 Hyperlipidemia, unspecified: Secondary | ICD-10-CM | POA: Insufficient documentation

## 2023-01-16 DIAGNOSIS — K209 Esophagitis, unspecified without bleeding: Secondary | ICD-10-CM | POA: Diagnosis not present

## 2023-01-16 DIAGNOSIS — I10 Essential (primary) hypertension: Secondary | ICD-10-CM | POA: Diagnosis not present

## 2023-01-16 DIAGNOSIS — Z791 Long term (current) use of non-steroidal anti-inflammatories (NSAID): Secondary | ICD-10-CM | POA: Diagnosis not present

## 2023-01-16 DIAGNOSIS — I69959 Hemiplegia and hemiparesis following unspecified cerebrovascular disease affecting unspecified side: Secondary | ICD-10-CM | POA: Insufficient documentation

## 2023-01-16 DIAGNOSIS — Z7982 Long term (current) use of aspirin: Secondary | ICD-10-CM | POA: Insufficient documentation

## 2023-01-16 DIAGNOSIS — T18128A Food in esophagus causing other injury, initial encounter: Secondary | ICD-10-CM | POA: Insufficient documentation

## 2023-01-16 DIAGNOSIS — F419 Anxiety disorder, unspecified: Secondary | ICD-10-CM | POA: Diagnosis not present

## 2023-01-16 DIAGNOSIS — K22711 Barrett's esophagus with high grade dysplasia: Secondary | ICD-10-CM | POA: Insufficient documentation

## 2023-01-16 DIAGNOSIS — K2970 Gastritis, unspecified, without bleeding: Secondary | ICD-10-CM | POA: Insufficient documentation

## 2023-01-16 DIAGNOSIS — K2210 Ulcer of esophagus without bleeding: Secondary | ICD-10-CM | POA: Insufficient documentation

## 2023-01-16 DIAGNOSIS — Z87891 Personal history of nicotine dependence: Secondary | ICD-10-CM | POA: Insufficient documentation

## 2023-01-16 DIAGNOSIS — M069 Rheumatoid arthritis, unspecified: Secondary | ICD-10-CM | POA: Diagnosis not present

## 2023-01-16 DIAGNOSIS — Z79899 Other long term (current) drug therapy: Secondary | ICD-10-CM | POA: Diagnosis not present

## 2023-01-16 LAB — CBC WITH DIFFERENTIAL/PLATELET
Abs Immature Granulocytes: 0.01 10*3/uL (ref 0.00–0.07)
Basophils Absolute: 0 10*3/uL (ref 0.0–0.1)
Basophils Relative: 1 %
Eosinophils Absolute: 0.1 10*3/uL (ref 0.0–0.5)
Eosinophils Relative: 1 %
HCT: 39 % (ref 36.0–46.0)
Hemoglobin: 12.4 g/dL (ref 12.0–15.0)
Immature Granulocytes: 0 %
Lymphocytes Relative: 30 %
Lymphs Abs: 2.3 10*3/uL (ref 0.7–4.0)
MCH: 28.1 pg (ref 26.0–34.0)
MCHC: 31.8 g/dL (ref 30.0–36.0)
MCV: 88.4 fL (ref 80.0–100.0)
Monocytes Absolute: 0.7 10*3/uL (ref 0.1–1.0)
Monocytes Relative: 10 %
Neutro Abs: 4.3 10*3/uL (ref 1.7–7.7)
Neutrophils Relative %: 58 %
Platelets: 182 10*3/uL (ref 150–400)
RBC: 4.41 MIL/uL (ref 3.87–5.11)
RDW: 16.7 % — ABNORMAL HIGH (ref 11.5–15.5)
WBC: 7.4 10*3/uL (ref 4.0–10.5)
nRBC: 0 % (ref 0.0–0.2)

## 2023-01-16 LAB — COMPREHENSIVE METABOLIC PANEL
ALT: 20 U/L (ref 0–44)
AST: 23 U/L (ref 15–41)
Albumin: 4.4 g/dL (ref 3.5–5.0)
Alkaline Phosphatase: 71 U/L (ref 38–126)
Anion gap: 8 (ref 5–15)
BUN: 22 mg/dL (ref 8–23)
CO2: 28 mmol/L (ref 22–32)
Calcium: 9.4 mg/dL (ref 8.9–10.3)
Chloride: 99 mmol/L (ref 98–111)
Creatinine, Ser: 0.93 mg/dL (ref 0.44–1.00)
GFR, Estimated: >60 mL/min (ref >60)
Glucose, Bld: 106 mg/dL — ABNORMAL HIGH (ref 70–99)
Potassium: 3.4 mmol/L — ABNORMAL LOW (ref 3.5–5.1)
Sodium: 135 mmol/L (ref 135–145)
Total Bilirubin: 0.4 mg/dL (ref <1.2)
Total Protein: 7.7 g/dL (ref 6.5–8.1)

## 2023-01-16 MED ORDER — PANTOPRAZOLE SODIUM 40 MG IV SOLR
40.0000 mg | Freq: Once | INTRAVENOUS | Status: AC
  Administered 2023-01-17: 40 mg via INTRAVENOUS
  Filled 2023-01-16: qty 10

## 2023-01-16 MED ORDER — POTASSIUM CHLORIDE 10 MEQ/100ML IV SOLN
10.0000 meq | Freq: Once | INTRAVENOUS | Status: AC
  Administered 2023-01-17: 10 meq via INTRAVENOUS
  Filled 2023-01-16: qty 100

## 2023-01-16 MED ORDER — GLUCAGON HCL RDNA (DIAGNOSTIC) 1 MG IJ SOLR
1.0000 mg | Freq: Once | INTRAMUSCULAR | Status: AC
  Administered 2023-01-16: 1 mg via INTRAVENOUS
  Filled 2023-01-16: qty 1

## 2023-01-16 MED ORDER — ONDANSETRON HCL 4 MG/2ML IJ SOLN
4.0000 mg | Freq: Once | INTRAMUSCULAR | Status: AC
  Administered 2023-01-16: 4 mg via INTRAVENOUS
  Filled 2023-01-16: qty 2

## 2023-01-16 NOTE — ED Provider Notes (Signed)
[place] EMERGENCY DEPARTMENT AT [facility] Provider Note   CSN: [id number] Arrival date & time: 01/16/23  2121     History {Add pertinent medical, surgical, social history, OB history to HPI:1} Chief Complaint  Patient presents with   Emesis    Miranda Harrington is a 82 y.o. female.  82 year old female presents from home with daughter and son-in-law at bedside with concern for vomiting. Feels like something in her throat. Patient had endoscopy with procedure for her Barrett's on 01/11/23 by Dr. Enrigue Catena at [facility]. Advanced diet to rice and peas tonight and developed vomiting/regurgitation. Called her on-call number and was advised to go to the ER. Review of the note from Dr. Shana Chute, concern for food bolus impaction.  Denies abdominal pain, fevers, blood in emesis.        Home Medications Prior to Admission medications   Medication Sig Start Date End Date Taking? Authorizing Provider  Acetaminophen (TYLENOL PO) Take 2 tablets by mouth daily as needed (headache).    [provider]  amitriptyline (ELAVIL) 10 MG tablet Take 10 mg by mouth at bedtime.    [provider]  amitriptyline (ELAVIL) 10 MG tablet 1 tablet at bedtime.    [provider]  amLODipine (NORVASC) 5 MG tablet Take 10 mg by mouth daily. 07/09/20   [provider]  amoxicillin (AMOXIL) 500 MG capsule  12/16/20   [provider]  ascorbic acid (VITAMIN C) 250 MG tablet 1 tablet    [provider]  aspirin EC 81 MG tablet Take 81 mg by mouth daily. Patient not taking: Reported on 09/12/2022    [provider]  Cholecalciferol 50 MCG (2000 UT) CAPS 2 capsules    [provider]  cyclobenzaprine (FLEXERIL) 10 MG tablet TAKE 1 TABLET BY MOUTH UP TO 2 TIMES DAILY IF NEEDED 10/31/19   [provider]  diclofenac (VOLTAREN) 75 MG EC tablet TAKE 1 TABLET BY MOUTH 2 TIMES DAILY WITH FOOD OR MILK Patient not taking: Reported on  09/12/2022    [provider]  ezetimibe (ZETIA) 10 MG tablet Take 10 mg by mouth daily.    [provider]  golimumab 2 mg/kg in sodium chloride 0.9 % Inject 1 application into the vein. Every other month.    [provider]  hydrochlorothiazide (MICROZIDE) 12.5 MG capsule Take 12.5 mg by mouth daily.  Patient not taking: Reported on 09/12/2022    [provider]  HYDROcodone-acetaminophen (NORCO) 10-325 MG tablet Take 1 tablet by mouth 3 (three) times daily as needed. 12/13/18   [provider]  lidocaine (XYLOCAINE) 1 % (with preservative) injection by Infiltration route. 11/26/20   [provider]  Palivizumab (SYNAGIS IM) Inject into the muscle. Once every other month    [provider]  predniSONE (DELTASONE) 5 MG tablet TAKE AS DIRECTED BY PHYSICIAN OR PACKAGE INSTRUCTIONS. 12/14/18   [provider]  pregabalin (LYRICA) 75 MG capsule TAKE 1 CAPSULE BY MOUTH 2 TIMES DAILY Patient taking differently: daily. TAKE 1 CAPSULE BY MOUTH 2 TIMES DAILY 06/10/17   Butch Penny, NP  ramipril (ALTACE) 5 MG tablet Take 5 mg by mouth 2 (two) times daily.     [provider]  rosuvastatin (CRESTOR) 40 MG tablet Take 40 mg by mouth at bedtime.     [provider]  sertraline (ZOLOFT) 50 MG tablet 1.5 tab    [provider]  tapentadol (NUCYNTA) 50 MG tablet 1 tablet  [provider]  triamcinolone acetonide (TRIESENCE) 40 MG/ML SUSP Inject into the articular space. 11/26/20   [provider]      Allergies    Bee venom, Sulfa antibiotics, Erythromycin, Other, Demerol [meperidine], and Hydroxychloroquine    Review of Systems   Review of Systems Negative except as per HPI Physical Exam Updated Vital Signs BP (!) 160/75   Pulse 65   Temp 98.6 F (37 C) (Oral)   Resp 18   Ht 5\' 1"  (1.549 m)   Wt 74.4 kg   SpO2 98%   BMI 30.99 kg/m  Physical Exam Vitals and nursing note  reviewed.  Constitutional:      General: She is not in acute distress.    Appearance: She is well-developed. She is not diaphoretic.  HENT:     Head: Normocephalic and atraumatic.  Cardiovascular:     Rate and Rhythm: Normal rate and regular rhythm.     Heart sounds: Normal heart sounds.  Pulmonary:     Effort: Pulmonary effort is normal.     Breath sounds: Normal breath sounds.  Abdominal:     Palpations: Abdomen is soft.     Tenderness: There is no abdominal tenderness.  Musculoskeletal:     Cervical back: Neck supple.     Right lower leg: No edema.     Left lower leg: No edema.  Skin:    General: Skin is warm and dry.  Neurological:     Mental Status: She is alert and oriented to person, place, and time.  Psychiatric:        Behavior: Behavior normal.     ED Results / Procedures / Treatments   Labs (all labs ordered are listed, but only abnormal results are displayed) Labs Reviewed - No data to display  EKG None  Radiology No results found.  Procedures Procedures  {Document cardiac monitor, telemetry assessment procedure when appropriate:1}  Medications Ordered in ED Medications - No data to display  ED Course/ Medical Decision Making/ A&P   {   Click here for ABCD2, HEART and other calculatorsREFRESH Note before signing :1}                              Medical Decision Making  This patient presents to the ED for concern of vomiting, this involves an extensive number of treatment options, and is a complaint that carries with it a high risk of complications and morbidity.  The differential diagnosis includes impacted food bolus, aspiration, perforation    Co morbidities that complicate the patient evaluation  ***   Additional history obtained:  Additional history obtained from family at bedside who contributes to history as above External records from outside source obtained and reviewed including call to [facility] tonight to Dr. Shana Chute, advised to go to the  ER with concern for impacted food bolus.    Lab Tests:  I Ordered, and personally interpreted labs.  The pertinent results include:  ***   Imaging Studies ordered:  I ordered imaging studies including CXR  I independently visualized and interpreted imaging which showed *** I agree with the radiologist interpretation   Cardiac Monitoring: / EKG:  The patient was maintained on a cardiac monitor.  I personally viewed and interpreted the cardiac monitored which showed an underlying rhythm of: ***   Consultations Obtained:  I requested consultation with the ***,  and discussed lab and imaging findings as well as pertinent plan -  they recommend: ***   Problem List / ED Course / Critical interventions / Medication management  82 year old female with concern for impacted food bolus/vomiting. Patient appears comfortable during assessment. Provided with small sip of carbonated beverage and immediately began retching/vomiting. Advised patient not to eat or drink anything else. Call to [facility] for recommendations for management.  I ordered medication including ***  for ***  Reevaluation of the patient after these medicines showed that the patient {resolved/improved/worsened:23923::"improved"} I have reviewed the patients home medicines and have made adjustments as needed   Social Determinants of Health:  ***   Test / Admission - Considered:  ***   {Document critical care time when appropriate:1} {Document review of labs and clinical decision tools ie heart score, Chads2Vasc2 etc:1}  {Document your independent review of radiology images, and any outside records:1} {Document your discussion with family members, caretakers, and with consultants:1} {Document social determinants of health affecting pt's care:1} {Document your decision making why or why not admission, treatments were needed:1} Final Clinical Impression(s) / ED Diagnoses Final diagnoses:  None    Rx / DC Orders ED  Discharge Orders     None

## 2023-01-16 NOTE — ED Triage Notes (Addendum)
Pt Brought by the ems from home. As per ems pt keep on throwing up since 6pm today. No complains of abdominal pain and nausea. As per pt she had endoscopy last July and found out that she have barrets esophagus and last Monday they had to do a procedure for it.

## 2023-01-16 NOTE — ED Provider Notes (Incomplete)
[place] EMERGENCY DEPARTMENT AT [facility] Provider Note   CSN: [id number] Arrival date & time: 01/16/23  2121     History {Add pertinent medical, surgical, social history, OB history to HPI:1} Chief Complaint  Patient presents with  . Emesis    Miranda Harrington is a 82 y.o. female.  82 year old female presents from home with daughter and son-in-law at bedside with concern for vomiting. Feels like something in her throat. Patient had endoscopy with procedure for her Barrett's on 01/11/23 by Dr. Enrigue Catena at [facility]. Advanced diet to rice and peas tonight and developed vomiting/regurgitation. Called her on-call number and was advised to go to the ER. Review of the note from Dr. Shana Chute, concern for food bolus impaction.  Denies abdominal pain, fevers, blood in emesis.        Home Medications Prior to Admission medications   Medication Sig Start Date End Date Taking? Authorizing Provider  Acetaminophen (TYLENOL PO) Take 2 tablets by mouth daily as needed (headache).    [provider]  amitriptyline (ELAVIL) 10 MG tablet Take 10 mg by mouth at bedtime.    [provider]  amitriptyline (ELAVIL) 10 MG tablet 1 tablet at bedtime.    [provider]  amLODipine (NORVASC) 5 MG tablet Take 10 mg by mouth daily. 07/09/20   [provider]  amoxicillin (AMOXIL) 500 MG capsule  12/16/20   [provider]  ascorbic acid (VITAMIN C) 250 MG tablet 1 tablet    [provider]  aspirin EC 81 MG tablet Take 81 mg by mouth daily. Patient not taking: Reported on 09/12/2022    [provider]  Cholecalciferol 50 MCG (2000 UT) CAPS 2 capsules    [provider]  cyclobenzaprine (FLEXERIL) 10 MG tablet TAKE 1 TABLET BY MOUTH UP TO 2 TIMES DAILY IF NEEDED 10/31/19   [provider]  diclofenac (VOLTAREN) 75 MG EC tablet TAKE 1 TABLET BY MOUTH 2 TIMES DAILY WITH FOOD OR MILK Patient not taking: Reported on  09/12/2022    [provider]  ezetimibe (ZETIA) 10 MG tablet Take 10 mg by mouth daily.    [provider]  golimumab 2 mg/kg in sodium chloride 0.9 % Inject 1 application into the vein. Every other month.    [provider]  hydrochlorothiazide (MICROZIDE) 12.5 MG capsule Take 12.5 mg by mouth daily.  Patient not taking: Reported on 09/12/2022    [provider]  HYDROcodone-acetaminophen (NORCO) 10-325 MG tablet Take 1 tablet by mouth 3 (three) times daily as needed. 12/13/18   [provider]  lidocaine (XYLOCAINE) 1 % (with preservative) injection by Infiltration route. 11/26/20   [provider]  Palivizumab (SYNAGIS IM) Inject into the muscle. Once every other month    [provider]  predniSONE (DELTASONE) 5 MG tablet TAKE AS DIRECTED BY PHYSICIAN OR PACKAGE INSTRUCTIONS. 12/14/18   [provider]  pregabalin (LYRICA) 75 MG capsule TAKE 1 CAPSULE BY MOUTH 2 TIMES DAILY Patient taking differently: daily. TAKE 1 CAPSULE BY MOUTH 2 TIMES DAILY 06/10/17   Butch Penny, NP  ramipril (ALTACE) 5 MG tablet Take 5 mg by mouth 2 (two) times daily.     [provider]  rosuvastatin (CRESTOR) 40 MG tablet Take 40 mg by mouth at bedtime.     [provider]  sertraline (ZOLOFT) 50 MG tablet 1.5 tab    [provider]  tapentadol (NUCYNTA) 50 MG tablet 1 tablet  [provider]  triamcinolone acetonide (TRIESENCE) 40 MG/ML SUSP Inject into the articular space. 11/26/20   [provider]      Allergies    Bee venom, Sulfa antibiotics, Erythromycin, Other, Demerol [meperidine], and Hydroxychloroquine    Review of Systems   Review of Systems Negative except as per HPI Physical Exam Updated Vital Signs BP (!) 160/75   Pulse 65   Temp 98.6 F (37 C) (Oral)   Resp 18   Ht 5\' 1"  (1.549 m)   Wt 74.4 kg   SpO2 98%   BMI 30.99 kg/m  Physical Exam Vitals and nursing note  reviewed.  Constitutional:      General: She is not in acute distress.    Appearance: She is well-developed. She is not diaphoretic.  HENT:     Head: Normocephalic and atraumatic.  Cardiovascular:     Rate and Rhythm: Normal rate and regular rhythm.     Heart sounds: Normal heart sounds.  Pulmonary:     Effort: Pulmonary effort is normal.     Breath sounds: Normal breath sounds.  Abdominal:     Palpations: Abdomen is soft.     Tenderness: There is no abdominal tenderness.  Musculoskeletal:     Cervical back: Neck supple.     Right lower leg: No edema.     Left lower leg: No edema.  Skin:    General: Skin is warm and dry.  Neurological:     Mental Status: She is alert and oriented to person, place, and time.  Psychiatric:        Behavior: Behavior normal.     ED Results / Procedures / Treatments   Labs (all labs ordered are listed, but only abnormal results are displayed) Labs Reviewed - No data to display  EKG None  Radiology No results found.  Procedures Procedures  {Document cardiac monitor, telemetry assessment procedure when appropriate:1}  Medications Ordered in ED Medications - No data to display  ED Course/ Medical Decision Making/ A&P   {   Click here for ABCD2, HEART and other calculatorsREFRESH Note before signing :1}                              Medical Decision Making Amount and/or Complexity of Data Reviewed Labs: ordered. Radiology: ordered.  Risk Prescription drug management.   This patient presents to the ED for concern of vomiting, this involves an extensive number of treatment options, and is a complaint that carries with it a high risk of complications and morbidity.  The differential diagnosis includes impacted food bolus, aspiration, perforation    Co morbidities that complicate the patient evaluation  ***   Additional history obtained:  Additional history obtained from family at bedside who contributes to history as  above External records from outside source obtained and reviewed including call to [facility] tonight to Dr. Shana Chute, advised to go to the ER with concern for impacted food bolus.    Lab Tests:  I Ordered, and personally interpreted labs.  The pertinent results include:  ***   Imaging Studies ordered:  I ordered imaging studies including CXR  I independently visualized and interpreted imaging which showed *** I agree with the radiologist interpretation   Cardiac Monitoring: / EKG:  The patient was maintained on a cardiac monitor.  I personally viewed and interpreted the cardiac monitored which showed an underlying rhythm of: ***   Consultations Obtained:  I requested  consultation with the Dr.,  and discussed lab and imaging findings as well as pertinent plan - they recommend: ***   Problem List / ED Course / Critical interventions / Medication management  82 year old female with concern for impacted food bolus/vomiting. Patient appears comfortable during assessment. Provided with small sip of carbonated beverage and immediately began retching/vomiting. Advised patient not to eat or drink anything else. Call to [facility] for recommendations for management.  I ordered medication including ***  for ***  Reevaluation of the patient after these medicines showed that the patient {resolved/improved/worsened:23923::"improved"} I have reviewed the patients home medicines and have made adjustments as needed   Social Determinants of Health:  ***   Test / Admission - Considered:  ***   {Document critical care time when appropriate:1} {Document review of labs and clinical decision tools ie heart score, Chads2Vasc2 etc:1}  {Document your independent review of radiology images, and any outside records:1} {Document your discussion with family members, caretakers, and with consultants:1} {Document social determinants of health affecting pt's care:1} {Document your decision making why or why not  admission, treatments were needed:1} Final Clinical Impression(s) / ED Diagnoses Final diagnoses:  None    Rx / DC Orders ED Discharge Orders     None

## 2023-01-17 ENCOUNTER — Emergency Department (HOSPITAL_COMMUNITY): Payer: Medicare Other | Admitting: Anesthesiology

## 2023-01-17 ENCOUNTER — Encounter (HOSPITAL_COMMUNITY)
Admission: EM | Disposition: A | Discharge status: Home / Self Care | Payer: Self-pay | Source: Home / Self Care | Attending: Emergency Medicine

## 2023-01-17 DIAGNOSIS — K449 Diaphragmatic hernia without obstruction or gangrene: Secondary | ICD-10-CM | POA: Diagnosis not present

## 2023-01-17 DIAGNOSIS — R1310 Dysphagia, unspecified: Secondary | ICD-10-CM | POA: Diagnosis not present

## 2023-01-17 DIAGNOSIS — T18128A Food in esophagus causing other injury, initial encounter: Secondary | ICD-10-CM | POA: Diagnosis not present

## 2023-01-17 HISTORY — PX: ESOPHAGOGASTRODUODENOSCOPY: SHX5428

## 2023-01-17 SURGERY — EGD (ESOPHAGOGASTRODUODENOSCOPY)
Anesthesia: General

## 2023-01-17 MED ORDER — LIDOCAINE 2% (20 MG/ML) 5 ML SYRINGE
INTRAMUSCULAR | Status: DC | PRN
  Administered 2023-01-17: 100 mg via INTRAVENOUS

## 2023-01-17 MED ORDER — PROPOFOL 10 MG/ML IV BOLUS
INTRAVENOUS | Status: AC
  Filled 2023-01-17: qty 20

## 2023-01-17 MED ORDER — SODIUM CHLORIDE 0.9 % IV SOLN: INTRAVENOUS | Status: DC | PRN

## 2023-01-17 MED ORDER — SODIUM CHLORIDE 0.9 % IV SOLN: Freq: Once | INTRAVENOUS | Status: AC

## 2023-01-17 MED ORDER — PANTOPRAZOLE SODIUM 40 MG PO TBEC: 40.0000 mg | DELAYED_RELEASE_TABLET | Freq: Two times a day (BID) | ORAL | 1 refills | Status: AC

## 2023-01-17 MED ORDER — PROPOFOL 500 MG/50ML IV EMUL
INTRAVENOUS | Status: DC | PRN
  Administered 2023-01-17: 70 mg via INTRAVENOUS

## 2023-01-17 NOTE — Consult Note (Signed)
Referring Provider: ED Primary Care Physician:  Shon Hale, MD Primary Gastroenterologist:  Dr. Lorenso Quarry  Reason for Consultation: Food impaction  HPI: Miranda Harrington is a 82 y.o. female with past medical history of Barrett's esophagus with high-grade dysplasia who underwent EMR for nodular Barrett's with high-grade dysplasia at [facility] on January 11, 2023 presented to the hospital with food impaction.  She was eating rice and peas and started having sensation of food bolus followed by episodes of vomiting.  She was subsequently not able to maintain her own secretions.  She is feeling somewhat better today but she is afraid that there is something still stuck in esophagus.  Denies any other GI symptoms.    Past Medical History:  Diagnosis Date   Anemia    after a miscarriage   Anxiety    after stroke   Arthritis    osteoarthritis   Carotid artery occlusion    Hyperlipidemia    Hypertension    PHN (postherpetic neuralgia) 03/16/2016   Sjogren's syndrome (HCC)    Stroke [facility]) 2008   Right hand / Right foot  Hemorrhagic stroke   Varicose veins    bleeding episode from varicose vein right posterior calf 07-20-2015      Past Surgical History:  Procedure Laterality Date   ADENOIDECTOMY  1968   DILATION AND CURETTAGE OF UTERUS     EYE SURGERY     REVERSE SHOULDER ARTHROPLASTY Right 08/20/2016   REVERSE SHOULDER ARTHROPLASTY Right 08/20/2016   Procedure: REVERSE SHOULDER ARTHROPLASTY;  Surgeon: Francena Hanly, MD;  Location: [facility] OR;  Service: Orthopedics;  Laterality: Right;   STRABISMUS SURGERY Right 09/02/2018   Procedure: REPAIR STRABISMUS RIGHT EYE;  Surgeon: Verne Carrow, MD;  Location: [facility];  Service: Ophthalmology;  Laterality: Right;   TONSILLECTOMY  1968    Prior to Admission medications   Medication Sig Start Date End Date Taking? Authorizing Provider  Acetaminophen (TYLENOL PO) Take 2 tablets by mouth daily as needed (headache).    [provider]  amitriptyline (ELAVIL) 10 MG tablet Take 10 mg by mouth at bedtime.    [provider]  amitriptyline (ELAVIL) 10 MG tablet 1 tablet at bedtime.    [provider]  amLODipine (NORVASC) 5 MG tablet Take 10 mg by mouth daily. 07/09/20   [provider]  amoxicillin (AMOXIL) 500 MG capsule  12/16/20   [provider]  ascorbic acid (VITAMIN C) 250 MG tablet 1 tablet    [provider]  aspirin EC 81 MG tablet Take 81 mg by mouth daily. Patient not taking: Reported on 09/12/2022    [provider]  Cholecalciferol 50 MCG (2000 UT) CAPS 2 capsules    [provider]  cyclobenzaprine (FLEXERIL) 10 MG tablet TAKE 1 TABLET BY MOUTH UP TO 2 TIMES DAILY IF NEEDED 10/31/19   [provider]  diclofenac (VOLTAREN) 75 MG EC tablet TAKE 1 TABLET BY MOUTH 2 TIMES DAILY WITH FOOD OR MILK Patient not taking: Reported on 09/12/2022    [provider]  ezetimibe (ZETIA) 10 MG tablet Take 10 mg by mouth daily.    [provider]  golimumab 2 mg/kg in sodium chloride 0.9 % Inject 1 application into the vein. Every other month.    [provider]  hydrochlorothiazide (MICROZIDE) 12.5 MG capsule Take 12.5 mg by mouth daily.  Patient not taking: Reported on 09/12/2022    [provider]  HYDROcodone-acetaminophen (NORCO) 10-325 MG tablet Take 1 tablet  by mouth 3 (three) times daily as needed. 12/13/18   [provider]  lidocaine (XYLOCAINE) 1 % (with preservative) injection by Infiltration route. 11/26/20   [provider]  Palivizumab (SYNAGIS IM) Inject into the muscle. Once every other month    [provider]  predniSONE (DELTASONE) 5 MG tablet TAKE AS DIRECTED BY PHYSICIAN OR PACKAGE INSTRUCTIONS. 12/14/18   [provider]  pregabalin (LYRICA) 75 MG capsule TAKE 1 CAPSULE BY MOUTH 2 TIMES DAILY Patient taking differently: daily. TAKE 1 CAPSULE BY MOUTH 2 TIMES  DAILY 06/10/17   Butch Penny, NP  ramipril (ALTACE) 5 MG tablet Take 5 mg by mouth 2 (two) times daily.     [provider]  rosuvastatin (CRESTOR) 40 MG tablet Take 40 mg by mouth at bedtime.     [provider]  sertraline (ZOLOFT) 50 MG tablet 1.5 tab    [provider]  tapentadol (NUCYNTA) 50 MG tablet 1 tablet    [provider]  triamcinolone acetonide (TRIESENCE) 40 MG/ML SUSP Inject into the articular space. 11/26/20   [provider]    Scheduled Meds: Continuous Infusions: PRN Meds:.  Allergies as of 01/16/2023 - Review Complete 01/16/2023  Allergen Reaction Noted   Bee venom Anaphylaxis and Hives 07/20/2015   Sulfa antibiotics Hives 07/14/2012   Erythromycin Hives 08/21/2020   Other  02/22/2020   Demerol [meperidine] Other (See Comments) 08/11/2016   Hydroxychloroquine Rash 08/14/2020    Family History  Problem Relation Age of Onset   Other Mother        varicose veins   Rheum arthritis Mother    Cancer Father    Hypertension Father    Multiple sclerosis Sister    Lymphoma Maternal Aunt    Leukemia Grandchild     Social History   Socioeconomic History   Marital status: Divorced    Spouse name: Not on file   Number of children: 3   Years of education: college   Highest education level: Not on file  Occupational History   Not on file  Tobacco Use   Smoking status: Former    Current packs/day: 0.00    Types: Cigarettes    Quit date: 08/11/2005    Years since quitting: 17.4   Smokeless tobacco: Never  Vaping Use   Vaping status: Never Used  Substance and Sexual Activity   Alcohol use: Yes    Comment: rare   Drug use: No   Sexual activity: Not on file  Other Topics Concern   Not on file  Social History Narrative   Patient is divorced.   Patient has three children.   Patient does not drink any caffeine.   Patient is right-handed.   Patient has a college education.   Social Determinants of Health    Financial Resource Strain: Low Risk  (12/08/2021)   Overall Financial Resource Strain (CARDIA)    Difficulty of Paying Living Expenses: Not hard at all  Food Insecurity: Low Risk  (08/04/2022)   Received from [facility]   Hunger Vital Sign    Worried About Running Out of Food in the Last Year: Never true    Ran Out of Food in the Last Year: Never true  Transportation Needs: Not on file (08/04/2022)  Physical Activity: Not on file  Stress: No Stress Concern Present (12/08/2021)   [employer] of Occupational Health - Occupational Stress Questionnaire    Feeling of Stress : Not at all  Social Connections: Not on  file  Intimate Partner Violence: Not on file    Review of Systems: All negative except as stated above in HPI.  Physical Exam: Vital signs: Vitals:   01/17/23 0340 01/17/23 0424  BP: (!) 120/59   Pulse: (!) 58   Resp: 18   Temp:  98.7 F (37.1 C)  SpO2: 96%      General: Elderly patient, not in acute distress Lungs: No visible distress Heart:  Regular rate and rhythm; no murmurs, clicks, rubs,  or gallops. Abdomen: Soft, nontender, nondistended, bowel sound present, no peritoneal signs Mood and affect normal Alert and oriented x 3 Rectal:  Deferred  GI:  Lab Results: Recent Labs    01/16/23 2320  WBC 7.4  HGB 12.4  HCT 39.0  PLT 182   BMET Recent Labs    01/16/23 2320  NA 135  K 3.4*  CL 99  CO2 28  GLUCOSE 106*  BUN 22  CREATININE 0.93  CALCIUM 9.4   LFT Recent Labs    01/16/23 2320  PROT 7.7  ALBUMIN 4.4  AST 23  ALT 20  ALKPHOS 71  BILITOT 0.4   PT/INR No results for input(s): "LABPROT", "INR" in the last 72 hours.   Studies/Results: DG Chest 2 View  Result Date: 01/16/2023 CLINICAL DATA:  Vomiting EXAM: CHEST - 2 VIEW COMPARISON:  Chest x-ray 02/24/2018 FINDINGS: Cardiomediastinal silhouette is stable. There is prominence in the right paratracheal region. Heart is mildly enlarged. There is no focal lung infiltrate,  pleural effusion or pneumothorax. IMPRESSION: 1. No active cardiopulmonary disease. 2. Mild cardiomegaly. 3. Stable prominence in the right paratracheal region. Findings may related to enlarged thyroid gland, ectatic vasculature or lymphadenopathy. This can be further evaluated with chest CT as clinically appropriate. Electronically Signed   By: Darliss Cheney M.D.   On: 01/16/2023 23:24    Impression/Plan: -Dysphagia with likely food impaction in a patient who recently underwent EMR for nodular Barrett's with high-grade dysplasia on January 11, 2023 at [facility].  Recommendations -------------------------- -Plan for EGD today -Patient was advised to stay on a soft diet until she feels better.  Risks (bleeding, infection, bowel perforation that could require surgery, sedation-related changes in cardiopulmonary systems), benefits (identification and possible treatment of source of symptoms, exclusion of certain causes of symptoms), and alternatives (watchful waiting, radiographic imaging studies, empiric medical treatment)  were explained to patient/family in detail and patient wishes to proceed.     LOS: 0 days   Kathi Der  MD, FACP 01/17/2023, 6:32 AM  Contact #  [phone]

## 2023-01-17 NOTE — Anesthesia Postprocedure Evaluation (Signed)
Anesthesia Post Note  Patient: Miranda Harrington  Procedure(s) Performed: ESOPHAGOGASTRODUODENOSCOPY (EGD)     Patient location during evaluation: PACU Anesthesia Type: General Level of consciousness: awake and alert Pain management: pain level controlled Vital Signs Assessment: post-procedure vital signs reviewed and stable Respiratory status: spontaneous breathing, nonlabored ventilation and respiratory function stable Cardiovascular status: blood pressure returned to baseline Postop Assessment: no apparent nausea or vomiting Anesthetic complications: no   No notable events documented.  Last Vitals:  Vitals:   01/17/23 0653 01/17/23 0750  BP: (!) 173/61 (!) 121/55  Pulse: (!) 59 (!) 52  Resp: 14 18  Temp: (!) 36.4 C 36.5 C  SpO2: 97% 98%    Last Pain:  Vitals:   01/17/23 0750  TempSrc: Temporal  PainSc: 0-No pain                 Shanda Howells

## 2023-01-17 NOTE — Discharge Instructions (Signed)

## 2023-01-17 NOTE — Op Note (Signed)
[facility] Patient Name: Miranda Harrington Procedure Date: 01/17/2023 MRN: [id number] Attending MD: Kathi Der , MD, [id number] Date of Birth: 25-Apr-1940 CSN: [id number] Age: 82 Admit Type: Inpatient Procedure:                Upper GI endoscopy Indications:              Dysphagia Providers:                Kathi Der, MD, Margaree Mackintosh, RN,                            Jacquelyn "Jaci" Clelia Croft, RN, Salley Scarlet,                            Technician Referring MD:              Medicines:                Sedation Administered by an Anesthesia Professional Complications:            No immediate complications. Estimated Blood Loss:     Estimated blood loss was minimal. Procedure:                Pre-Anesthesia Assessment:                           - Prior to the procedure, a History and Physical                            was performed, and patient medications and                            allergies were reviewed. The patient's tolerance of                            previous anesthesia was also reviewed. The risks                            and benefits of the procedure and the sedation                            options and risks were discussed with the patient.                            All questions were answered, and informed consent                            was obtained. Prior Anticoagulants: The patient has                            taken no anticoagulant or antiplatelet agents. ASA                            Grade Assessment: III - A patient with severe  systemic disease. After reviewing the risks and                            benefits, the patient was deemed in satisfactory                            condition to undergo the procedure.                           After obtaining informed consent, the endoscope was                            passed under direct vision. Throughout the                            procedure,  the patient's blood pressure, pulse, and                            oxygen saturations were monitored continuously. The                            [id number]-[id number] ([id number]) Olympus endoscope was introduced                            through the mouth, and advanced to the second part                            of duodenum. The upper GI endoscopy was                            accomplished without difficulty. The patient                            tolerated the procedure well. Scope In: Scope Out: Findings:      LA Grade B (one or more mucosal breaks greater than 5 mm, not extending       between the tops of two mucosal folds) esophagitis was found in the       distal esophagus.      One superficial esophageal ulcer with no bleeding and no stigmata of       recent bleeding was found at the gastroesophageal junction at 31 cm.       Likely from recent EMR.      A medium-sized hiatal hernia was present.      Scattered mild inflammation characterized by congestion (edema) and       erythema was found in the gastric antrum and in the prepyloric region of       the stomach.      The cardia and gastric fundus were normal on retroflexion.      The duodenal bulb, first portion of the duodenum and second portion of       the duodenum were normal. Impression:               - LA Grade B esophagitis.                           -  Esophageal ulcer with no bleeding and no stigmata                            of recent bleeding.                           - Medium-sized hiatal hernia.                           - Gastritis.                           - Normal duodenal bulb, first portion of the                            duodenum and second portion of the duodenum.                           - No specimens collected. Moderate Sedation:      Moderate (conscious) sedation was personally administered by an       anesthesia professional. The following parameters were monitored: oxygen       saturation, heart rate,  blood pressure, and response to care. Recommendation:           - Patient has a contact number available for                            emergencies. The signs and symptoms of potential                            delayed complications were discussed with the                            patient. Return to normal activities tomorrow.                            Written discharge instructions were provided to the                            patient.                           - Soft diet.                           - Continue present medications. Procedure Code(s):        --- Professional ---                           [id number], Esophagogastroduodenoscopy, flexible,                            transoral; diagnostic, including collection of                            specimen(s) by brushing or washing, when performed                            (  separate procedure) Diagnosis Code(s):        --- Professional ---                           K20.90, Esophagitis, unspecified without bleeding                           K22.10, Ulcer of esophagus without bleeding                           K44.9, Diaphragmatic hernia without obstruction or                            gangrene                           K29.70, Gastritis, unspecified, without bleeding                           R13.10, Dysphagia, unspecified CPT copyright 2022 American Medical Association. All rights reserved. The codes documented in this report are preliminary and upon coder review may  be revised to meet current compliance requirements. Kathi Der, MD Kathi Der, MD 01/17/2023 7:53:30 AM Number of Addenda: 0

## 2023-01-17 NOTE — Brief Op Note (Signed)
01/17/2023  7:47 AM  PATIENT:  Miranda Harrington  82 y.o. female  PRE-OPERATIVE DIAGNOSIS:  food bolus  POST-OPERATIVE DIAGNOSIS:  esophagitis, hiatal hernia  PROCEDURE:  Procedure(s): ESOPHAGOGASTRODUODENOSCOPY (EGD) (N/A)  SURGEON:  Surgeons and Role:    * Mikaele Stecher, MD - Primary  Findings ----------- -EGD negative for food impaction.  Showed changes of recent EMR at GE junction, medium size hiatal hernia and gastritis.  Recommendations -------------------------- -Recommend to take Protonix twice a day for next 4 weeks -Recommend to stay on soft diet for another 4 to 5 days -Chew food well and moist food before swallowing -Okay to discharge from GI standpoint.  Kathi Der MD, FACP 01/17/2023, 7:48 AM  Contact #  [phone]

## 2023-01-17 NOTE — Transfer of Care (Signed)
Immediate Anesthesia Transfer of Care Note  Patient: Miranda Harrington  Procedure(s) Performed: ESOPHAGOGASTRODUODENOSCOPY (EGD)  Patient Location: PACU  Anesthesia Type:MAC  Level of Consciousness: awake, alert , and oriented  Airway & Oxygen Therapy: Patient Spontanous Breathing  Post-op Assessment: Report given to RN, Post -op Vital signs reviewed and stable, Patient moving all extremities, and Patient moving all extremities X 4  Post vital signs: Reviewed and stable  Last Vitals:  Vitals Value Taken Time  BP    Temp    Pulse    Resp    SpO2      Last Pain:  Vitals:   01/17/23 0653  TempSrc: Temporal  PainSc: 0-No pain         Complications: No notable events documented.

## 2023-01-17 NOTE — Anesthesia Preprocedure Evaluation (Signed)
Anesthesia Evaluation  Patient identified by MRN, date of birth, ID band Patient awake    Reviewed: Allergy & Precautions, NPO status , Patient's Chart, lab work & pertinent test results  History of Anesthesia Complications Negative for: history of anesthetic complications  Airway Mallampati: II  TM Distance: >3 FB Neck ROM: Full    Dental  (+) Partial Lower, Partial Upper   Pulmonary former smoker   Pulmonary exam normal        Cardiovascular hypertension, Normal cardiovascular exam     Neuro/Psych   Anxiety     CVA (right sided weakness), Residual Symptoms    GI/Hepatic Neg liver ROS,,,Food impaction, likely resolved, residual dysphagia   Endo/Other  negative endocrine ROS    Renal/GU negative Renal ROS     Musculoskeletal  (+) Arthritis ,    Abdominal   Peds  Hematology negative hematology ROS (+)   Anesthesia Other Findings Day of surgery medications reviewed with patient.  Reproductive/Obstetrics                              Anesthesia Physical Anesthesia Plan  ASA: 2 and emergent  Anesthesia Plan: MAC   Post-op Pain Management: Minimal or no pain anticipated   Induction: Intravenous  PONV Risk Score and Plan: 2 and Propofol infusion and Treatment may vary due to age or medical condition  Airway Management Planned: Natural Airway and Simple Face Mask  Additional Equipment: None  Intra-op Plan:   Post-operative Plan:   Informed Consent: I have reviewed the patients History and Physical, chart, labs and discussed the procedure including the risks, benefits and alternatives for the proposed anesthesia with the patient or authorized representative who has indicated his/her understanding and acceptance.       Plan Discussed with: CRNA  Anesthesia Plan Comments:          Anesthesia Quick Evaluation

## 2023-01-17 NOTE — ED Notes (Signed)
Patient into endoscopy now.

## 2023-01-19 ENCOUNTER — Encounter (HOSPITAL_COMMUNITY): Payer: Self-pay | Admitting: Gastroenterology

## 2023-02-16 ENCOUNTER — Encounter: Payer: Self-pay | Admitting: Hematology and Oncology

## 2023-03-08 ENCOUNTER — Inpatient Hospital Stay: Payer: Medicare Other | Attending: Hematology and Oncology

## 2023-03-08 DIAGNOSIS — D509 Iron deficiency anemia, unspecified: Secondary | ICD-10-CM | POA: Insufficient documentation

## 2023-03-08 LAB — CBC WITH DIFFERENTIAL (CANCER CENTER ONLY)
Abs Immature Granulocytes: 0.02 10*3/uL (ref 0.00–0.07)
Basophils Absolute: 0 10*3/uL (ref 0.0–0.1)
Basophils Relative: 1 %
Eosinophils Absolute: 0.1 10*3/uL (ref 0.0–0.5)
Eosinophils Relative: 1 %
HCT: 34.6 % — ABNORMAL LOW (ref 36.0–46.0)
Hemoglobin: 11.4 g/dL — ABNORMAL LOW (ref 12.0–15.0)
Immature Granulocytes: 0 %
Lymphocytes Relative: 29 %
Lymphs Abs: 1.9 10*3/uL (ref 0.7–4.0)
MCH: 29.1 pg (ref 26.0–34.0)
MCHC: 32.9 g/dL (ref 30.0–36.0)
MCV: 88.3 fL (ref 80.0–100.0)
Monocytes Absolute: 0.5 10*3/uL (ref 0.1–1.0)
Monocytes Relative: 7 %
Neutro Abs: 4 10*3/uL (ref 1.7–7.7)
Neutrophils Relative %: 62 %
Platelet Count: 166 10*3/uL (ref 150–400)
RBC: 3.92 MIL/uL (ref 3.87–5.11)
RDW: 14.4 % (ref 11.5–15.5)
WBC Count: 6.4 10*3/uL (ref 4.0–10.5)
nRBC: 0 % (ref 0.0–0.2)

## 2023-03-08 LAB — IRON AND IRON BINDING CAPACITY (CC-WL,HP ONLY)
Iron: 107 ug/dL (ref 28–170)
Saturation Ratios: 30 % (ref 10.4–31.8)
TIBC: 356 ug/dL (ref 250–450)
UIBC: 249 ug/dL (ref 148–442)

## 2023-03-08 LAB — FERRITIN: Ferritin: 69 ng/mL (ref 11–307)

## 2023-03-11 ENCOUNTER — Inpatient Hospital Stay: Payer: Medicare Other | Admitting: Hematology and Oncology

## 2023-03-11 DIAGNOSIS — D509 Iron deficiency anemia, unspecified: Secondary | ICD-10-CM

## 2023-03-11 NOTE — Progress Notes (Signed)
HEMATOLOGY-ONCOLOGY TELEPHONE VISIT PROGRESS NOTE  I connected with our patient on 03/11/23 at 11:15 AM EST by telephone and verified that I am speaking with the correct person using two identifiers.  I discussed the limitations, risks, security and privacy concerns of performing an evaluation and management service by telephone and the availability of in person appointments.  I also discussed with the patient that there may be a patient responsible charge related to this service. The patient expressed understanding and agreed to proceed.   History of Present Illness:   History of Present Illness   The patient, with a history of Barrett's esophagus and low iron levels, presents for a follow-up after recent blood work. She reports that her rheumatologist was pleased with the improvement in her hemoglobin levels, which had increased to 12.8. The patient attributes this improvement to the treatment she has been receiving for Barrett's esophagus. She had her first treatment in November and is scheduled for a second treatment in February. She has not experienced any new or worsening symptoms since her last visit.      REVIEW OF SYSTEMS:   Constitutional: Denies fevers, chills or abnormal weight loss All other systems were reviewed with the patient and are negative. Observations/Objective:     Assessment Plan:  Iron deficiency anemia Lab review: 02/26/2022: Hemoglobin 10.4, MCV 84 08/13/2022: Hemoglobin 9, MCV 80, creatinine 1.02, iron saturation 5%, TIBC 464 09/12/2022: Hemoglobin 8.6, MCV 78.4, SPEP: Normal, reticulocyte: 1.3%, reticulocyte hemoglobin 20.6, LDH 169, iron saturation 3%, TIBC 532, ferritin 5, DAT negative, B12: 908, Folate: 45.8, Erythropoietin: 45.8 12/18/2022: Hemoglobin 11.8, MCV 84.8, platelets 166, iron saturation 15%, ferritin 72 03/26/2023: Hemoglobin 11.4, MCV 88.3, iron saturation 30%, ferritin 69   Based on the above results patient does not need any additional IV iron  therapy.  She demonstrated excellent response to treatment.     Barretts Esophagus: Seeing [facility]. Endoscopy being planned and cauterization of Barretts. (Nov 2024)   Re-check labs in 6 months and telephone visit after that.   --------------------------------- Assessment and Plan    Iron Deficiency Anemia Improved iron levels and hemoglobin since iron infusion in August. Likely secondary to Barrett's esophagus with possible bleeding. -Continue current management. -No further iron supplementation needed at this time.  Barrett's Esophagus Undergoing treatment with second session scheduled for February 10th. -Continue with planned treatment.  Follow-up Given improvement in iron deficiency anemia, propose to extend blood work and follow-up to every six months. -Schedule follow-up appointment.          I discussed the assessment and treatment plan with the patient. The patient was provided an opportunity to ask questions and all were answered. The patient agreed with the plan and demonstrated an understanding of the instructions. The patient was advised to call back or seek an in-person evaluation if the symptoms worsen or if the condition fails to improve as anticipated.   I provided 20 minutes of non-face-to-face time during this encounter.  This includes time for charting and coordination of care   Tamsen Meek, MD

## 2023-03-11 NOTE — Assessment & Plan Note (Signed)
Lab review: 02/26/2022: Hemoglobin 10.4, MCV 84 08/13/2022: Hemoglobin 9, MCV 80, creatinine 1.02, iron saturation 5%, TIBC 464 09/12/2022: Hemoglobin 8.6, MCV 78.4, SPEP: Normal, reticulocyte: 1.3%, reticulocyte hemoglobin 20.6, LDH 169, iron saturation 3%, TIBC 532, ferritin 5, DAT negative, B12: 908, Folate: 45.8, Erythropoietin: 45.8 12/18/2022: Hemoglobin 11.8, MCV 84.8, platelets 166, iron saturation 15%, ferritin 72 03/26/2023: Hemoglobin 11.4, MCV 88.3, iron saturation 30%, ferritin 69   Based on the above results patient does not need any additional IV iron therapy.  She demonstrated excellent response to treatment.     Barretts Esophagus: Seeing [facility]. Endoscopy being planned and cauterization of Barretts. (Nov 2024)   Re-check labs in 6 months and telephone visit after that.

## 2023-03-12 ENCOUNTER — Telehealth: Payer: Medicare Other | Admitting: Hematology and Oncology

## 2023-07-12 ENCOUNTER — Other Ambulatory Visit: Payer: Self-pay

## 2023-07-12 ENCOUNTER — Encounter (HOSPITAL_COMMUNITY): Payer: Self-pay

## 2023-07-12 ENCOUNTER — Emergency Department (HOSPITAL_COMMUNITY)
Admission: EM | Admit: 2023-07-12 | Discharge: 2023-07-12 | Disposition: A | Discharge status: Home / Self Care | Attending: Emergency Medicine | Admitting: Emergency Medicine

## 2023-07-12 DIAGNOSIS — Z79899 Other long term (current) drug therapy: Secondary | ICD-10-CM | POA: Insufficient documentation

## 2023-07-12 DIAGNOSIS — R519 Headache, unspecified: Secondary | ICD-10-CM | POA: Diagnosis present

## 2023-07-12 DIAGNOSIS — I10 Essential (primary) hypertension: Secondary | ICD-10-CM | POA: Insufficient documentation

## 2023-07-12 LAB — I-STAT CHEM 8, ED
BUN: 44 mg/dL — ABNORMAL HIGH (ref 8–23)
Calcium, Ion: 1.11 mmol/L — ABNORMAL LOW (ref 1.15–1.40)
Chloride: 104 mmol/L (ref 98–111)
Creatinine, Ser: 1 mg/dL (ref 0.44–1.00)
Glucose, Bld: 102 mg/dL — ABNORMAL HIGH (ref 70–99)
HCT: 43 % (ref 36.0–46.0)
Hemoglobin: 14.6 g/dL (ref 12.0–15.0)
Potassium: 3.4 mmol/L — ABNORMAL LOW (ref 3.5–5.1)
Sodium: 138 mmol/L (ref 135–145)
TCO2: 25 mmol/L (ref 22–32)

## 2023-07-12 NOTE — Discharge Instructions (Addendum)
 Follow-up with your doctor tomorrow as scheduled.  Return to the emergency room if you have any worsening symptoms.

## 2023-07-12 NOTE — ED Triage Notes (Addendum)
 Patient BIB [facility] from home for HTN with headache. Patient has hx of HTN and recently changed meds, took them at 0600 and 1800, patient reports between doses the pressure went up but now feels better. BP 160/90, down from 210/130, no meds HR 82 98% RA R 20 CBG 120

## 2023-07-12 NOTE — ED Provider Notes (Signed)
 [place] EMERGENCY DEPARTMENT AT [facility] Provider Note   CSN: [id number] Arrival date & time: 07/12/23  2024     History  Chief Complaint  Patient presents with   Headache   Hypertension    Miranda Harrington is a 83 y.o. female.  Patient is an 83 year old female who presents with elevated blood pressure.  She has a history of hyperlipidemia, hypertension, Sjogren's disease, prior hemorrhagic stroke in 2008.  She said that recently her blood pressures have been more elevated and she increased her medication about 3 weeks ago.  She said for about the last week and a half she has been taking ramipril  10 mg twice a day instead of 5 mg twice a day.  She is followed by [facility] physicians.  She said today she did have a procedure for her Barrett's esophagus at [facility].  She noted her blood pressure was elevated there but they did go ahead and do the procedure.  Her blood pressures continue to climb throughout the day and at home they were in the 200 range systolic.  She got concerned because her blood pressures were so elevated.  She did wake up this morning with a mild headache which gradually got worse today although she said its pretty much resolved currently.  She has very mild headache.  No nausea or vomiting.  No dizziness.  No chest pain or shortness of breath.  No speech deficits or vision changes.  No numbness or weakness to her extremities.       Home Medications Prior to Admission medications   Medication Sig Start Date End Date Taking? Authorizing Provider  Acetaminophen  (TYLENOL  PO) Take 2 tablets by mouth daily as needed (headache).    [provider]  amitriptyline  (ELAVIL ) 10 MG tablet Take 10 mg by mouth at bedtime.    [provider]  amitriptyline  (ELAVIL ) 10 MG tablet 1 tablet at bedtime.    [provider]  amLODipine (NORVASC) 5 MG tablet Take 10 mg by mouth daily. 07/09/20   [provider]  ascorbic acid (VITAMIN C) 250  MG tablet 1 tablet    [provider]  aspirin  EC 81 MG tablet Take 81 mg by mouth daily. Patient not taking: Reported on 09/12/2022    [provider]  Cholecalciferol 50 MCG (2000 UT) CAPS 2 capsules    [provider]  cyclobenzaprine  (FLEXERIL ) 10 MG tablet TAKE 1 TABLET BY MOUTH UP TO 2 TIMES DAILY IF NEEDED 10/31/19   [provider]  ezetimibe  (ZETIA ) 10 MG tablet Take 10 mg by mouth daily.    [provider]  golimumab 2 mg/kg in sodium chloride  0.9 % Inject 1 application into the vein. Every other month.    [provider]  hydrochlorothiazide  (MICROZIDE ) 12.5 MG capsule Take 12.5 mg by mouth daily.    [provider]  HYDROcodone-acetaminophen  (NORCO) 10-325 MG tablet Take 1 tablet by mouth 3 (three) times daily as needed. 12/13/18   [provider]  Palivizumab (SYNAGIS IM) Inject into the muscle. Once every other month    [provider]  pantoprazole  (PROTONIX ) 40 MG tablet Take 1 tablet (40 mg total) by mouth 2 (two) times daily. 01/17/23 01/17/24  Felecia Hopper, MD  predniSONE (DELTASONE) 5 MG tablet TAKE AS DIRECTED BY PHYSICIAN OR PACKAGE INSTRUCTIONS. 12/14/18   [provider]  pregabalin  (LYRICA ) 75 MG capsule TAKE 1 CAPSULE BY MOUTH 2 TIMES DAILY Patient taking differently: daily. TAKE 1 CAPSULE BY  MOUTH 2 TIMES DAILY 06/10/17   Millikan, Megan, NP  ramipril  (ALTACE ) 5 MG tablet Take 5 mg by mouth 2 (two) times daily.     [provider]  rosuvastatin  (CRESTOR ) 40 MG tablet Take 40 mg by mouth at bedtime.     [provider]  sertraline  (ZOLOFT ) 50 MG tablet 1.5 tab    [provider]  tapentadol (NUCYNTA) 50 MG tablet 1 tablet    [provider]      Allergies    Bee venom, Sulfa antibiotics, Erythromycin, Other, Demerol [meperidine], and Hydroxychloroquine    Review of Systems   Review of Systems  Constitutional:  Negative for chills,  diaphoresis, fatigue and fever.  HENT:  Negative for congestion, rhinorrhea and sneezing.   Eyes: Negative.   Respiratory:  Negative for cough, chest tightness and shortness of breath.   Cardiovascular:  Negative for chest pain and leg swelling.  Gastrointestinal:  Negative for abdominal pain, blood in stool, diarrhea, nausea and vomiting.  Genitourinary:  Negative for difficulty urinating, flank pain, frequency and hematuria.  Musculoskeletal:  Negative for arthralgias and back pain.  Skin:  Negative for rash.  Neurological:  Positive for headaches. Negative for dizziness, speech difficulty, weakness and numbness.    Physical Exam Updated Vital Signs BP (!) 158/85 (BP Location: Right Arm)   Pulse 71   Temp 99 F (37.2 C) (Oral)   Resp 17   SpO2 99%  Physical Exam Constitutional:      Appearance: She is well-developed.  HENT:     Head: Normocephalic and atraumatic.  Eyes:     Pupils: Pupils are equal, round, and reactive to light.  Cardiovascular:     Rate and Rhythm: Normal rate and regular rhythm.     Heart sounds: Normal heart sounds.  Pulmonary:     Effort: Pulmonary effort is normal. No respiratory distress.     Breath sounds: Normal breath sounds. No wheezing or rales.  Chest:     Chest wall: No tenderness.  Abdominal:     General: Bowel sounds are normal.     Palpations: Abdomen is soft.     Tenderness: There is no abdominal tenderness. There is no guarding or rebound.  Musculoskeletal:        General: Normal range of motion.     Cervical back: Normal range of motion and neck supple.  Lymphadenopathy:     Cervical: No cervical adenopathy.  Skin:    General: Skin is warm and dry.     Findings: No rash.  Neurological:     Mental Status: She is alert and oriented to person, place, and time.     Comments: Motor 5/5 all extremities Sensation grossly intact to LT all extremities Finger to Nose intact, no pronator drift CN II-XII grossly intact       ED  Results / Procedures / Treatments   Labs (all labs ordered are listed, but only abnormal results are displayed) Labs Reviewed  I-STAT CHEM 8, ED - Abnormal; Notable for the following components:      Result Value   Potassium 3.4 (*)    BUN 44 (*)    Glucose, Bld 102 (*)    Calcium , Ion 1.11 (*)    All other components within normal limits    EKG EKG Interpretation Date/Time:  Monday Jul 12 2023 22:10:48 EDT Ventricular Rate:  73 PR Interval:  198 QRS Duration:  82 QT Interval:  396 QTC Calculation: 436 R Axis:   -  17  Text Interpretation: Normal sinus rhythm Low voltage QRS Borderline ECG When compared with ECG of 30-Aug-2018 13:43, PREVIOUS ECG IS PRESENT since last tracing no significant change Confirmed by Hershel Los [phone]) on 07/12/2023 10:22:40 PM  Radiology No results found.  Procedures Procedures    Medications Ordered in ED Medications - No data to display  ED Course/ Medical Decision Making/ A&P                                 Medical Decision Making  Patient is a 83 year old female who presents with elevated blood pressure.  She had a headache earlier today but says it is very minimal currently.  She does not have any neurologic deficits or concerns for stroke.  No chest pain or other concerns for ACS.  Her EKG does not show any ischemic changes or arrhythmias.  Labs are nonconcerning.  She has normal kidney function.  Her blood pressure has trended down nicely without treatment.  Her last blood pressure was 169/78.  She has an appointment tomorrow to follow-up with her PCP at 10:00.  Advised her to keep that appointment.  She was discharged home in good condition.  Return precautions were given.  Final Clinical Impression(s) / ED Diagnoses Final diagnoses:  Hypertension, unspecified type    Rx / DC Orders ED Discharge Orders     None         Hershel Los, MD 07/12/23 2258

## 2023-07-21 ENCOUNTER — Other Ambulatory Visit: Payer: Self-pay | Admitting: Family Medicine

## 2023-07-21 DIAGNOSIS — Z1231 Encounter for screening mammogram for malignant neoplasm of breast: Secondary | ICD-10-CM

## 2023-08-03 ENCOUNTER — Telehealth: Payer: Self-pay | Admitting: *Deleted

## 2023-08-03 DIAGNOSIS — D509 Iron deficiency anemia, unspecified: Secondary | ICD-10-CM

## 2023-08-03 NOTE — Telephone Encounter (Signed)
 Received VM from pt stating she has not been scheduled for lab and MD f/u.  Per last MD visit, pt needing iron  labs checked and MD f/u 2 days later.  Message sent to scheduling team.

## 2023-08-09 ENCOUNTER — Ambulatory Visit
Admission: RE | Admit: 2023-08-09 | Discharge: 2023-08-09 | Disposition: A | Discharge status: Home / Self Care | Source: Ambulatory Visit | Attending: Family Medicine

## 2023-08-09 DIAGNOSIS — Z1231 Encounter for screening mammogram for malignant neoplasm of breast: Secondary | ICD-10-CM

## 2023-08-12 ENCOUNTER — Inpatient Hospital Stay: Attending: Hematology and Oncology

## 2023-08-12 DIAGNOSIS — D509 Iron deficiency anemia, unspecified: Secondary | ICD-10-CM | POA: Insufficient documentation

## 2023-08-12 LAB — CBC WITH DIFFERENTIAL (CANCER CENTER ONLY)
Abs Immature Granulocytes: 0.01 10*3/uL (ref 0.00–0.07)
Basophils Absolute: 0.1 10*3/uL (ref 0.0–0.1)
Basophils Relative: 1 %
Eosinophils Absolute: 0.1 10*3/uL (ref 0.0–0.5)
Eosinophils Relative: 1 %
HCT: 35.4 % — ABNORMAL LOW (ref 36.0–46.0)
Hemoglobin: 11.2 g/dL — ABNORMAL LOW (ref 12.0–15.0)
Immature Granulocytes: 0 %
Lymphocytes Relative: 37 %
Lymphs Abs: 1.9 10*3/uL (ref 0.7–4.0)
MCH: 28.6 pg (ref 26.0–34.0)
MCHC: 31.6 g/dL (ref 30.0–36.0)
MCV: 90.5 fL (ref 80.0–100.0)
Monocytes Absolute: 0.5 10*3/uL (ref 0.1–1.0)
Monocytes Relative: 10 %
Neutro Abs: 2.7 10*3/uL (ref 1.7–7.7)
Neutrophils Relative %: 51 %
Platelet Count: 195 10*3/uL (ref 150–400)
RBC: 3.91 MIL/uL (ref 3.87–5.11)
RDW: 13.5 % (ref 11.5–15.5)
WBC Count: 5.2 10*3/uL (ref 4.0–10.5)
nRBC: 0 % (ref 0.0–0.2)

## 2023-08-12 LAB — IRON AND IRON BINDING CAPACITY (CC-WL,HP ONLY)
Iron: 61 ug/dL (ref 28–170)
Saturation Ratios: 15 % (ref 10.4–31.8)
TIBC: 396 ug/dL (ref 250–450)
UIBC: 335 ug/dL (ref 148–442)

## 2023-08-12 LAB — FERRITIN: Ferritin: 32 ng/mL (ref 11–307)

## 2023-08-15 NOTE — Assessment & Plan Note (Signed)
 Lab review: 02/26/2022: Hemoglobin 10.4, MCV 84 08/13/2022: Hemoglobin 9, MCV 80, creatinine 1.02, iron  saturation 5%, TIBC 464 09/12/2022: Hemoglobin 8.6, MCV 78.4, SPEP: Normal, reticulocyte: 1.3%, reticulocyte hemoglobin 20.6, LDH 169, iron  saturation 3%, TIBC 532, ferritin 5, DAT negative, B12: 908, Folate: 45.8, Erythropoietin : 45.8 12/18/2022: Hemoglobin 11.8, MCV 84.8, platelets 166, iron  saturation 15%, ferritin 72 03/26/2023: Hemoglobin 11.4, MCV 88.3, iron  saturation 30%, ferritin 69 08/12/23: Hemoglobin: 11.2, Iron  Sat: 15%, Ferritin: 32  IV Iron : Aug 2024  RTC in 6 months with labs and telephone visit after

## 2023-08-16 ENCOUNTER — Inpatient Hospital Stay (HOSPITAL_BASED_OUTPATIENT_CLINIC_OR_DEPARTMENT_OTHER): Admitting: Hematology and Oncology

## 2023-08-16 DIAGNOSIS — D509 Iron deficiency anemia, unspecified: Secondary | ICD-10-CM | POA: Diagnosis not present

## 2023-08-16 NOTE — Progress Notes (Signed)
 HEMATOLOGY-ONCOLOGY TELEPHONE VISIT PROGRESS NOTE  I connected with our patient on 08/16/23 at 12:15 PM EDT by telephone and verified that I am speaking with the correct person using two identifiers.  I discussed the limitations, risks, security and privacy concerns of performing an evaluation and management service by telephone and the availability of in person appointments.  I also discussed with the patient that there may be a patient responsible charge related to this service. The patient expressed understanding and agreed to proceed.   History of Present Illness:  History of Present Illness Miranda Harrington is an 83 year old female who presents for follow-up of her iron  levels and blood work.  Her hemoglobin level was 11.4 in January and is now 11.2. Iron  stores are currently at 37. She last received an iron  infusion almost a year ago. She feels very well and others have commented on her improved appearance.      REVIEW OF SYSTEMS:   Constitutional: Denies fevers, chills or abnormal weight loss All other systems were reviewed with the patient and are negative. Observations/Objective:     Assessment Plan:  Iron  deficiency anemia Lab review: 02/26/2022: Hemoglobin 10.4, MCV 84 08/13/2022: Hemoglobin 9, MCV 80, creatinine 1.02, iron  saturation 5%, TIBC 464 09/12/2022: Hemoglobin 8.6, MCV 78.4, SPEP: Normal, reticulocyte: 1.3%, reticulocyte hemoglobin 20.6, LDH 169, iron  saturation 3%, TIBC 532, ferritin 5, DAT negative, B12: 908, Folate: 45.8, Erythropoietin : 45.8 12/18/2022: Hemoglobin 11.8, MCV 84.8, platelets 166, iron  saturation 15%, ferritin 72 03/26/2023: Hemoglobin 11.4, MCV 88.3, iron  saturation 30%, ferritin 69 08/12/23: Hemoglobin: 11.2, Iron  Sat: 15%, Ferritin: 32  IV Iron : Aug 2024  RTC in 6 months with labs and telephone visit after    I discussed the assessment and treatment plan with the patient. The patient was provided an opportunity to ask questions and all were  answered. The patient agreed with the plan and demonstrated an understanding of the instructions. The patient was advised to call back or seek an in-person evaluation if the symptoms worsen or if the condition fails to improve as anticipated.   I provided 20 minutes of non-face-to-face time during this encounter.  This includes time for charting and coordination of care   [name], MD

## 2023-09-16 ENCOUNTER — Telehealth: Payer: Self-pay

## 2023-09-16 DIAGNOSIS — Z748 Other problems related to care provider dependency: Secondary | ICD-10-CM

## 2023-11-07 NOTE — H&P (Signed)
 PRE-PROCEDURE HISTORY AND PHYSICAL EXAM  Miranda Harrington presents for her scheduled UGI ENDOSCOPY; WITH BIOPSY, SINGLE OR MULTIPLE.   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    There have been no significant recent changes in the patient's medical status.  Past Medical History[1] Past Surgical History[2]  Allergies Allergies[3]  Medications HYDROcodone -acetaminophen , amitriptyline , amlodipine, amoxicillin, ascorbic acid (vitamin C), (cholecalciferol (vitamin D3-50 mcg (2,000 unit))), cyclobenzaprine , diclofenac  sodium, ezetimibe , golimumab, hydroCHLOROthiazide , multivitamin therapeutic with minerals, predniSONE, pregabalin , ramipril , rosuvastatin , and sertraline   Physical Examination Vitals:   11/01/23 1150  BP: 141/73  Pulse: (!) 49  Resp: 15  Temp:   SpO2: 97%   Body mass index is 30.23 kg/m.  Mental Status: AAOx3, thoughts organized   Lungs: Clear to auscultation, unlabored breathing   Heart: Regular rate and rhythm, normal S1 and S2, no murmur   Abdomen: Soft, non-tender, non-distended     ASSESSMENT AND PLAN Miranda Harrington has been evaluated and deemed appropriate to undergo the planned UGI ENDOSCOPY; WITH BIOPSY, SINGLE OR MULTIPLE.  The patient, or her authorized representative, was provided an explanation of the nature and benefits of the procedure(s), the most frequent risks, and alternatives, if any.  I personally reviewed this information with the patient, or her authorized representative, and answered all questions.       [1] Past Medical History: Diagnosis Date  . Arthritis   . HLD (hyperlipidemia)   . Hypertension   . Stroke    (CMS-HCC) 2008  [2] Past Surgical History: Procedure Laterality Date  . PR EDG TRANSORAL ENDOSCOPIC MUCOSAL RESECTION N/A 01/11/2023   Procedure: ESOPHAGOGASTRODUODENOSCOPY, FLEXIBLE, TRANSORAL; WITH ENDOSCOPIC MUCOSAL RESECTION;  Surgeon: West Mabel Agent, MD;   [facility];  Service: Gastroenterology  . PR EDG TRANSORAL ENDOSCOPIC MUCOSAL RESECTION N/A 07/12/2023   Procedure: ESOPHAGOGASTRODUODENOSCOPY, FLEXIBLE, TRANSORAL; WITH ENDOSCOPIC MUCOSAL RESECTION;  Surgeon: West Mabel Agent, MD;  [facility]: [facility];  Service: Gastroenterology  . PR ESOPHAGOSCOPY FLEX TRANSORAL LESION ABLATION N/A 04/05/2023   Procedure: ESOPHAGOSCOPY, FLEXIBLE, TRANSORAL; WITH ABLATION OF TUMOR(S), POLYP(S), OR OTHER LESION(S) (INCLUDES PRE- AND POST-DILATION AND GUIDE WIRE PASSAGE, WHEN PERFORMED);  Surgeon: West Mabel Agent, MD;  [facility]: [facility];  Service: Gastroenterology  . PR UPPER GI ENDOSCOPY,BIOPSY N/A 07/12/2023   Procedure: UGI ENDOSCOPY; WITH BIOPSY, SINGLE OR MULTIPLE;  Surgeon: West Mabel Agent, MD;  Location: [facility];  Service: Gastroenterology  . PR UPPER GI ENDOSCOPY,BIOPSY N/A 11/01/2023   Procedure: UGI ENDOSCOPY; WITH BIOPSY, SINGLE OR MULTIPLE;  Surgeon: West Mabel Agent, MD;  Location: [facility];  Service: Gastroenterology  . TOTAL SHOULDER REPLACEMENT Right 2018  [3] Allergies Allergen Reactions  . Venom-Honey Bee Anaphylaxis and Hives  . Clindamycin Rash  . Erythromycin Hives  . Other Rash

## 2023-11-11 ENCOUNTER — Emergency Department (HOSPITAL_COMMUNITY)

## 2023-11-11 ENCOUNTER — Observation Stay (HOSPITAL_COMMUNITY)

## 2023-11-11 ENCOUNTER — Other Ambulatory Visit: Payer: Self-pay

## 2023-11-11 ENCOUNTER — Observation Stay (HOSPITAL_COMMUNITY)
Admission: EM | Admit: 2023-11-11 | Discharge: 2023-11-12 | Disposition: A | Discharge status: Home Health | Attending: Student in an Organized Health Care Education/Training Program | Admitting: Student in an Organized Health Care Education/Training Program

## 2023-11-11 DIAGNOSIS — Z79899 Other long term (current) drug therapy: Secondary | ICD-10-CM | POA: Diagnosis not present

## 2023-11-11 DIAGNOSIS — E785 Hyperlipidemia, unspecified: Secondary | ICD-10-CM | POA: Diagnosis not present

## 2023-11-11 DIAGNOSIS — J189 Pneumonia, unspecified organism: Principal | ICD-10-CM | POA: Insufficient documentation

## 2023-11-11 DIAGNOSIS — A419 Sepsis, unspecified organism: Secondary | ICD-10-CM | POA: Diagnosis not present

## 2023-11-11 DIAGNOSIS — Z7982 Long term (current) use of aspirin: Secondary | ICD-10-CM | POA: Insufficient documentation

## 2023-11-11 DIAGNOSIS — Z1152 Encounter for screening for COVID-19: Secondary | ICD-10-CM | POA: Diagnosis not present

## 2023-11-11 DIAGNOSIS — I69359 Hemiplegia and hemiparesis following cerebral infarction affecting unspecified side: Secondary | ICD-10-CM | POA: Diagnosis not present

## 2023-11-11 DIAGNOSIS — R531 Weakness: Secondary | ICD-10-CM | POA: Diagnosis not present

## 2023-11-11 DIAGNOSIS — G8929 Other chronic pain: Secondary | ICD-10-CM | POA: Insufficient documentation

## 2023-11-11 DIAGNOSIS — R269 Unspecified abnormalities of gait and mobility: Principal | ICD-10-CM | POA: Insufficient documentation

## 2023-11-11 DIAGNOSIS — F419 Anxiety disorder, unspecified: Secondary | ICD-10-CM | POA: Diagnosis not present

## 2023-11-11 DIAGNOSIS — Z87891 Personal history of nicotine dependence: Secondary | ICD-10-CM | POA: Diagnosis not present

## 2023-11-11 DIAGNOSIS — M6281 Muscle weakness (generalized): Secondary | ICD-10-CM | POA: Insufficient documentation

## 2023-11-11 DIAGNOSIS — I10 Essential (primary) hypertension: Secondary | ICD-10-CM | POA: Diagnosis not present

## 2023-11-11 DIAGNOSIS — I69351 Hemiplegia and hemiparesis following cerebral infarction affecting right dominant side: Secondary | ICD-10-CM | POA: Diagnosis not present

## 2023-11-11 DIAGNOSIS — K219 Gastro-esophageal reflux disease without esophagitis: Secondary | ICD-10-CM | POA: Insufficient documentation

## 2023-11-11 DIAGNOSIS — Z8673 Personal history of transient ischemic attack (TIA), and cerebral infarction without residual deficits: Secondary | ICD-10-CM | POA: Insufficient documentation

## 2023-11-11 LAB — URINALYSIS, W/ REFLEX TO CULTURE (INFECTION SUSPECTED)
Bacteria, UA: NONE SEEN
Bilirubin Urine: NEGATIVE
Glucose, UA: NEGATIVE mg/dL
Hgb urine dipstick: NEGATIVE
Ketones, ur: NEGATIVE mg/dL
Leukocytes,Ua: NEGATIVE
Nitrite: NEGATIVE
Protein, ur: NEGATIVE mg/dL
Specific Gravity, Urine: 1.01 (ref 1.005–1.030)
pH: 6 (ref 5.0–8.0)

## 2023-11-11 LAB — CBC WITH DIFFERENTIAL/PLATELET
Abs Immature Granulocytes: 0.07 K/uL (ref 0.00–0.07)
Basophils Absolute: 0 K/uL (ref 0.0–0.1)
Basophils Relative: 0 %
Eosinophils Absolute: 0 K/uL (ref 0.0–0.5)
Eosinophils Relative: 0 %
HCT: 40.8 % (ref 36.0–46.0)
Hemoglobin: 12.6 g/dL (ref 12.0–15.0)
Immature Granulocytes: 1 %
Lymphocytes Relative: 6 %
Lymphs Abs: 0.8 K/uL (ref 0.7–4.0)
MCH: 27.6 pg (ref 26.0–34.0)
MCHC: 30.9 g/dL (ref 30.0–36.0)
MCV: 89.3 fL (ref 80.0–100.0)
Monocytes Absolute: 0.8 K/uL (ref 0.1–1.0)
Monocytes Relative: 6 %
Neutro Abs: 11.2 K/uL — ABNORMAL HIGH (ref 1.7–7.7)
Neutrophils Relative %: 87 %
Platelets: 154 K/uL (ref 150–400)
RBC: 4.57 MIL/uL (ref 3.87–5.11)
RDW: 13.9 % (ref 11.5–15.5)
WBC: 12.9 K/uL — ABNORMAL HIGH (ref 4.0–10.5)
nRBC: 0 % (ref 0.0–0.2)

## 2023-11-11 LAB — COMPREHENSIVE METABOLIC PANEL WITH GFR
ALT: 17 U/L (ref 0–44)
AST: 27 U/L (ref 15–41)
Albumin: 3.9 g/dL (ref 3.5–5.0)
Alkaline Phosphatase: 64 U/L (ref 38–126)
Anion gap: 11 (ref 5–15)
BUN: 16 mg/dL (ref 8–23)
CO2: 30 mmol/L (ref 22–32)
Calcium: 9 mg/dL (ref 8.9–10.3)
Chloride: 99 mmol/L (ref 98–111)
Creatinine, Ser: 0.96 mg/dL (ref 0.44–1.00)
GFR, Estimated: 59 mL/min — ABNORMAL LOW (ref >60)
Glucose, Bld: 104 mg/dL — ABNORMAL HIGH (ref 70–99)
Potassium: 4 mmol/L (ref 3.5–5.1)
Sodium: 140 mmol/L (ref 135–145)
Total Bilirubin: 0.2 mg/dL (ref 0.0–1.2)
Total Protein: 6.7 g/dL (ref 6.5–8.1)

## 2023-11-11 LAB — RESP PANEL BY RT-PCR (RSV, FLU A&B, COVID)  RVPGX2
Influenza A by PCR: NEGATIVE
Influenza B by PCR: NEGATIVE
Resp Syncytial Virus by PCR: NEGATIVE
SARS Coronavirus 2 by RT PCR: NEGATIVE

## 2023-11-11 LAB — I-STAT CG4 LACTIC ACID, ED: Lactic Acid, Venous: 0.9 mmol/L (ref 0.5–1.9)

## 2023-11-11 LAB — PROCALCITONIN: Procalcitonin: 2.76 ng/mL

## 2023-11-11 MED ORDER — EZETIMIBE 10 MG PO TABS
10.0000 mg | ORAL_TABLET | Freq: Every day | ORAL | Status: DC
  Administered 2023-11-11 – 2023-11-12 (×2): 10 mg via ORAL
  Filled 2023-11-11 (×2): qty 1

## 2023-11-11 MED ORDER — ACETAMINOPHEN 650 MG RE SUPP: 650.0000 mg | Freq: Four times a day (QID) | RECTAL | Status: DC | PRN

## 2023-11-11 MED ORDER — SODIUM CHLORIDE 0.9 % IV SOLN: 1.0000 g | Freq: Once | INTRAVENOUS | Status: DC

## 2023-11-11 MED ORDER — ENOXAPARIN SODIUM 40 MG/0.4ML IJ SOSY
40.0000 mg | PREFILLED_SYRINGE | INTRAMUSCULAR | Status: DC
  Filled 2023-11-11: qty 0.4

## 2023-11-11 MED ORDER — SODIUM CHLORIDE 0.9 % IV SOLN
2.0000 g | INTRAVENOUS | Status: DC
  Administered 2023-11-11: 2 g via INTRAVENOUS
  Filled 2023-11-11: qty 20

## 2023-11-11 MED ORDER — GUAIFENESIN ER 600 MG PO TB12
600.0000 mg | ORAL_TABLET | Freq: Two times a day (BID) | ORAL | Status: DC
  Administered 2023-11-11 – 2023-11-12 (×2): 600 mg via ORAL
  Filled 2023-11-11 (×2): qty 1

## 2023-11-11 MED ORDER — ALBUTEROL SULFATE (2.5 MG/3ML) 0.083% IN NEBU: 2.5000 mg | INHALATION_SOLUTION | Freq: Four times a day (QID) | RESPIRATORY_TRACT | Status: DC | PRN

## 2023-11-11 MED ORDER — SODIUM CHLORIDE 0.9 % IV SOLN: 2.0000 g | INTRAVENOUS | Status: DC

## 2023-11-11 MED ORDER — SERTRALINE HCL 25 MG PO TABS
75.0000 mg | ORAL_TABLET | Freq: Every day | ORAL | Status: DC
  Administered 2023-11-11 – 2023-11-12 (×2): 75 mg via ORAL
  Filled 2023-11-11 (×2): qty 3

## 2023-11-11 MED ORDER — PANTOPRAZOLE SODIUM 40 MG PO TBEC
40.0000 mg | DELAYED_RELEASE_TABLET | Freq: Two times a day (BID) | ORAL | Status: DC
  Administered 2023-11-11 – 2023-11-12 (×2): 40 mg via ORAL
  Filled 2023-11-11 (×2): qty 1

## 2023-11-11 MED ORDER — ACETAMINOPHEN 325 MG PO TABS: 650.0000 mg | ORAL_TABLET | Freq: Four times a day (QID) | ORAL | Status: DC | PRN

## 2023-11-11 MED ORDER — LACTATED RINGERS IV BOLUS
1000.0000 mL | Freq: Once | INTRAVENOUS | Status: AC
  Administered 2023-11-11: 1000 mL via INTRAVENOUS

## 2023-11-11 MED ORDER — SODIUM CHLORIDE 0.9% FLUSH
3.0000 mL | Freq: Two times a day (BID) | INTRAVENOUS | Status: DC
  Administered 2023-11-11 – 2023-11-12 (×2): 3 mL via INTRAVENOUS

## 2023-11-11 MED ORDER — HYDROCODONE-ACETAMINOPHEN 10-325 MG PO TABS: 1.0000 | ORAL_TABLET | Freq: Three times a day (TID) | ORAL | Status: DC | PRN

## 2023-11-11 MED ORDER — SODIUM CHLORIDE 0.9 % IV SOLN
100.0000 mg | Freq: Two times a day (BID) | INTRAVENOUS | Status: DC
  Administered 2023-11-12: 100 mg via INTRAVENOUS
  Filled 2023-11-11 (×2): qty 100

## 2023-11-11 MED ORDER — ACETAMINOPHEN 325 MG PO TABS
650.0000 mg | ORAL_TABLET | Freq: Once | ORAL | Status: AC | PRN
  Administered 2023-11-11: 650 mg via ORAL
  Filled 2023-11-11: qty 2

## 2023-11-11 MED ORDER — DOXYCYCLINE HYCLATE 100 MG IV SOLR
100.0000 mg | Freq: Once | INTRAVENOUS | Status: AC
  Administered 2023-11-11: 100 mg via INTRAVENOUS
  Filled 2023-11-11: qty 100

## 2023-11-11 MED ORDER — LIDOCAINE 5 % EX PTCH
1.0000 | MEDICATED_PATCH | Freq: Every day | CUTANEOUS | Status: DC | PRN
  Administered 2023-11-12: 1 via TRANSDERMAL
  Filled 2023-11-11: qty 1

## 2023-11-11 MED ORDER — PREGABALIN 75 MG PO CAPS
75.0000 mg | ORAL_CAPSULE | Freq: Three times a day (TID) | ORAL | Status: DC
  Administered 2023-11-11 – 2023-11-12 (×3): 75 mg via ORAL
  Filled 2023-11-11 (×3): qty 1

## 2023-11-11 MED ORDER — ROSUVASTATIN CALCIUM 20 MG PO TABS
40.0000 mg | ORAL_TABLET | Freq: Every day | ORAL | Status: DC
  Administered 2023-11-11: 40 mg via ORAL
  Filled 2023-11-11: qty 2

## 2023-11-11 MED ORDER — SODIUM CHLORIDE 0.9 % IV SOLN
1.0000 g | Freq: Once | INTRAVENOUS | Status: DC
  Filled 2023-11-11: qty 10

## 2023-11-11 NOTE — ED Provider Notes (Signed)
 [place] EMERGENCY DEPARTMENT AT [facility] Provider Note HPI Miranda Harrington is a 83 y.o. 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  Patient states that she has been feeling more weak on the right side for the past week ever since she got an endoscopy.  She was previously able to walk with a cane and walker but has felt progressively weaker over the last week.  This morning she was unable to walk due to the progression of the weakness.  She had a mild headache this morning as well.  Denies cough, chest pain, shortness of breath, vomiting, diarrhea EMS reports that she was febrile to 100.6 prior to arrival.    Past Medical History:  Diagnosis Date   Anemia    after a miscarriage   Anxiety    after stroke   Arthritis    osteoarthritis   Carotid artery occlusion    Hyperlipidemia    Hypertension    PHN (postherpetic neuralgia) 03/16/2016   Sjogren's syndrome (HCC)    Stroke [facility]) 2008   Right hand / Right foot  Hemorrhagic stroke   Varicose veins    bleeding episode from varicose vein right posterior calf 07-20-2015     Past Surgical History:  Procedure Laterality Date   ADENOIDECTOMY  1968   DILATION AND CURETTAGE OF UTERUS     ESOPHAGOGASTRODUODENOSCOPY N/A 01/17/2023   Procedure: ESOPHAGOGASTRODUODENOSCOPY (EGD);  Surgeon: Elicia Claw, MD;  Location: [facility];  Service: Gastroenterology;  Laterality: N/A;   EYE SURGERY     REVERSE SHOULDER ARTHROPLASTY Right 08/20/2016   REVERSE SHOULDER ARTHROPLASTY Right 08/20/2016   Procedure: REVERSE SHOULDER ARTHROPLASTY;  Surgeon: Melita Drivers, MD;  Location: [facility];  Service: Orthopedics;  Laterality: Right;   STRABISMUS SURGERY Right 09/02/2018   Procedure: REPAIR STRABISMUS RIGHT EYE;  Surgeon: Neysa Fallow, MD;  Location:  SURGERY CENTER;  Service: Ophthalmology;  Laterality: Right;   TONSILLECTOMY   1968    Review of Systems Pertinent positives and negative findings are listed as part of the History of Present Illness and MDM  Physical Exam Vitals:   11/11/23 1402 11/11/23 1607 11/11/23 1630 11/11/23 1700  BP: 135/72  (!) 106/58   Pulse: 92  77   Resp: 18  (!) 23   Temp: 99.6 F (37.6 C) 100 F (37.8 C)    TempSrc: Oral Axillary    SpO2: 97%  97%   Height:    5' 1 (1.549 m)     Constitutional Nursing notes reviewed Vital signs reviewed  HEENT No obvious trauma Pupils round, equal, and reactive to light. Pupils cross midline Neck supple  Respiratory Effort normal Breathing well on room air CTAB  CV Normal rate and rhythm  No pitting edema  Abdomen Soft, non-tender, non-distended No peritonitis  MSK Atraumatic No obvious deformity ROM appropriate  Neuro Decreased sensation on the right side of the face Otherwise, cranial nerves intact.  No facial asymmetry 5/5 strength in bilateral upper and lower extremities but weaker on the right when compared to the left No aphasia No nystagmus    MDM:  Initial Differential Diagnoses includes viral URI, pneumonia, CVA, electrolyte abnormality, anemia poststroke recrudescence  I reviewed the patient's vitals, the nursing triage note and evaluated the patient at bedside.  Patient presents with right-sided weakness over the last week.  This morning was the first day that she was unable to walk  due to the weakness.  She was also febrile with EMS and had a mild headache this morning.  Denies cough, chest pain, shortness of breath, vomiting, diarrhea  I reviewed the patient's external records which show that the patient had a hemorrhagic stroke in 2008 with residual right-sided weakness  Physical exam is reassuring.  The patient has decreased sensation of the right side of the face which she says is baseline.  She does have 5/5 strength in bilateral upper and lower extremities but is weaker on the right when compared to the left.   No facial droop, aphasia or nystagmus.  She has had progressively worsening weakness in the right over the last week.  I have low suspicion for CVA but if this were to be a new CVA, she would be out of thrombectomy window.  Chest x-ray shows concern for atypical pneumonia.  Labs show mild leukocytosis to 12.9 with neutrophilic predominance.  Blood cultures obtained, UA obtained and patient started on Rocephin /Doxy for community-acquired pneumonia.  She was admitted for further workup and care with the likely diagnosis of poststroke recrudescence secondary to community-acquired pneumonia.  Plan for inpatient MRI of her brain.  Patient admitted to the medicine service, Dr. Claudene.  Procedures: Procedures  Medications administered in the ED: Medications  doxycycline  (VIBRAMYCIN ) 100 mg in sodium chloride  0.9 % 250 mL IVPB (100 mg Intravenous New Bag/Given 11/11/23 1603)  enoxaparin  (LOVENOX ) injection 40 mg (has no administration in time range)  sodium chloride  flush (NS) 0.9 % injection 3 mL (has no administration in time range)  acetaminophen  (TYLENOL ) tablet 650 mg (has no administration in time range)    Or  acetaminophen  (TYLENOL ) suppository 650 mg (has no administration in time range)  albuterol  (PROVENTIL ) (2.5 MG/3ML) 0.083% nebulizer solution 2.5 mg (has no administration in time range)  guaiFENesin  (MUCINEX ) 12 hr tablet 600 mg (has no administration in time range)  cefTRIAXone  (ROCEPHIN ) 2 g in sodium chloride  0.9 % 100 mL IVPB (2 g Intravenous New Bag/Given 11/11/23 1716)  doxycycline  (VIBRAMYCIN ) 100 mg in sodium chloride  0.9 % 250 mL IVPB (has no administration in time range)  acetaminophen  (TYLENOL ) tablet 650 mg (650 mg Oral Given 11/11/23 1422)  lactated ringers  bolus 1,000 mL (0 mLs Intravenous Stopped 11/11/23 1712)     Impression: 1. Weakness   2. Pneumonia of both lower lobes due to infectious organism      Patient's presentation is most consistent with acute presentation  with potential threat to life or bodily function.  Disposition: ED Disposition:  Admit     ED Discharge Orders     None             Dionisio Blunt, MD 11/11/23 1743    Towana Ozell BROCKS, MD 11/12/23 [phone]

## 2023-11-11 NOTE — Progress Notes (Signed)
 Patient got transferred from ED. Alert and oriented. On 2L [place]. Admission had done by Noell RN. Skin is intact per her. Connected to cardiac monitor.   Patient was taken for the MRI around 1830 pm.

## 2023-11-11 NOTE — ED Triage Notes (Signed)
 Pt. Brought in via [facility] from home with complaints of weakness; Pt. Has a hx of stroke and right sided weakness; Per [facility] there was no deficits except feeling different on one side when touched; Pt. Was febrile for [facility] with a temp of 100.6; Pt. Has a 20G L AC

## 2023-11-11 NOTE — H&P (Signed)
 History and Physical    Patient: Miranda Harrington FMW:[id number] DOB: [birth year] DOA: 11/11/2023 DOS: the patient was seen and examined on 11/11/2023 PCP: Chrystal Lamarr RAMAN, MD  Patient coming from: Home  Chief Complaint:  Chief Complaint  Patient presents with   Weakness   HPI: Miranda Harrington is a 83 y.o. female with medical history significant of hypertension, hyperlipidemia, CVA with right-sided deficits, and anxiety presents with inability to walk and fever.   She experienced an inability to walk this morning. She woke up around 9:15 AM and was unable to take steps, particularly affecting her right side, which was previously impacted by a thalamic stroke in 2008. She typically uses a cane or walker when out, but does not usually use them to go to the bathroom.  Patient reported it felt like her leg was just unable to function properly.  Ultimately she was able to get to and from the bathroom, but was very concerned that she may be having a stroke.  Patient notes that she has been having progressive weakness over this past week.    She mentions experiencing a fever this morning, with a recorded temperature of 100.46F at home. No recent cough, shortness of breath, chest pain, nausea, vomiting, or diarrhea. She also denies any issues with urination, such as burning or discomfort.  With EMS patient was noted to have a temperature of 100.6 F.  Review of EMS records note O2 saturations dropped down as low as 89% on room air for which she was placed on nasal cannula oxygen.  In the emergency department patient was noted to have temperature up to 100 F with vital signs otherwise maintained.  Labs significant for WBC 12.9 and lactic acid 0.9.  Chest x-ray noted faint interstitial densities thought to represent atelectasis versus atypical pneumonia not excluded.  Influenza, COVID-19, and RSV screening were negative.  Blood and urine cultures have been obtained.  Patient was given acetaminophen  650 mg  p.o., 1 L of lactated Ringer 's, doxycycline , and Rocephin .   Past Medical History:  Diagnosis Date   Anemia    after a miscarriage   Anxiety    after stroke   Arthritis    osteoarthritis   Carotid artery occlusion    Hyperlipidemia    Hypertension    PHN (postherpetic neuralgia) 03/16/2016   Sjogren's syndrome (HCC)    Stroke (HCC) 2008   Right hand / Right foot  Hemorrhagic stroke   Varicose veins    bleeding episode from varicose vein right posterior calf 07-20-2015     Past Surgical History:  Procedure Laterality Date   ADENOIDECTOMY  1968   DILATION AND CURETTAGE OF UTERUS     ESOPHAGOGASTRODUODENOSCOPY N/A 01/17/2023   Procedure: ESOPHAGOGASTRODUODENOSCOPY (EGD);  Surgeon: Elicia Claw, MD;  Location: [facility];  Service: Gastroenterology;  Laterality: N/A;   EYE SURGERY     REVERSE SHOULDER ARTHROPLASTY Right 08/20/2016   REVERSE SHOULDER ARTHROPLASTY Right 08/20/2016   Procedure: REVERSE SHOULDER ARTHROPLASTY;  Surgeon: Melita Drivers, MD;  Location: [facility];  Service: Orthopedics;  Laterality: Right;   STRABISMUS SURGERY Right 09/02/2018   Procedure: REPAIR STRABISMUS RIGHT EYE;  Surgeon: Neysa Fallow, MD;  Location: [facility];  Service: Ophthalmology;  Laterality: Right;   TONSILLECTOMY  1968   Social History:  reports that she quit smoking about 18 years ago. Her smoking use included cigarettes. She has never used smokeless tobacco. She reports current alcohol use. She reports that she does not use drugs.  Allergies  Allergen Reactions   Bee Venom Anaphylaxis and Hives   Sulfa Antibiotics Hives   Erythromycin Hives   Other     Other reaction(s): Other (See Comments) MYCIN   Demerol [Meperidine] Other (See Comments)    hallucination   Hydroxychloroquine Rash    Family History  Problem Relation Age of Onset   Other Mother        varicose veins   Rheum arthritis Mother    Cancer Father    Hypertension Father    Multiple sclerosis  Sister    Lymphoma Maternal Aunt    Leukemia Grandchild    BRCA 1/2 Neg Hx    Breast cancer Neg Hx     Prior to Admission medications   Medication Sig Start Date End Date Taking? Authorizing Provider  Acetaminophen  (TYLENOL  PO) Take 2 tablets by mouth daily as needed (headache).    [provider]  amitriptyline  (ELAVIL ) 10 MG tablet Take 10 mg by mouth at bedtime.    [provider]  amitriptyline  (ELAVIL ) 10 MG tablet 1 tablet at bedtime.    [provider]  amLODipine (NORVASC) 5 MG tablet Take 10 mg by mouth daily. 07/09/20   [provider]  ascorbic acid (VITAMIN C) 250 MG tablet 1 tablet    [provider]  aspirin  EC 81 MG tablet Take 81 mg by mouth daily. Patient not taking: Reported on 09/12/2022    [provider]  Cholecalciferol 50 MCG (2000 UT) CAPS 2 capsules    [provider]  cyclobenzaprine  (FLEXERIL ) 10 MG tablet TAKE 1 TABLET BY MOUTH UP TO 2 TIMES DAILY IF NEEDED 10/31/19   [provider]  ezetimibe  (ZETIA ) 10 MG tablet Take 10 mg by mouth daily.    [provider]  golimumab 2 mg/kg in sodium chloride  0.9 % Inject 1 application into the vein. Every other month.    [provider]  hydrochlorothiazide  (MICROZIDE ) 12.5 MG capsule Take 12.5 mg by mouth daily.    [provider]  HYDROcodone -acetaminophen  (NORCO) 10-325 MG tablet Take 1 tablet by mouth 3 (three) times daily as needed. 12/13/18   [provider]  Palivizumab (SYNAGIS IM) Inject into the muscle. Once every other month    [provider]  pantoprazole  (PROTONIX ) 40 MG tablet Take 1 tablet (40 mg total) by mouth 2 (two) times daily. 01/17/23 01/17/24  Elicia Claw, MD  predniSONE (DELTASONE) 5 MG tablet TAKE AS DIRECTED BY PHYSICIAN OR PACKAGE INSTRUCTIONS. 12/14/18   [provider]  pregabalin  (LYRICA ) 75 MG capsule TAKE 1 CAPSULE BY MOUTH 2 TIMES DAILY Patient taking differently:  daily. TAKE 1 CAPSULE BY MOUTH 2 TIMES DAILY 06/10/17   Millikan, Megan, NP  ramipril  (ALTACE ) 5 MG tablet Take 5 mg by mouth 2 (two) times daily.     [provider]  rosuvastatin  (CRESTOR ) 40 MG tablet Take 40 mg by mouth at bedtime.     [provider]  sertraline  (ZOLOFT ) 50 MG tablet 1.5 tab    [provider]  tapentadol (NUCYNTA) 50 MG tablet 1 tablet    [provider]    Physical Exam: Vitals:   11/11/23 1402 11/11/23 1607  BP: 135/72   Pulse: 92   Resp: 18   Temp: 99.6 F (37.6 C) 100 F (37.8 C)  TempSrc: Oral Axillary  SpO2: 97%    Constitutional: Elderly female currently in no acute distress Eyes: PERRL, lids and conjunctivae normal ENMT: Mucous membranes are moist. Posterior pharynx  clear of any exudate or lesions.Normal dentition.  Neck: normal, supple, no masses, no thyromegaly Respiratory: clear to auscultation bilaterally, no wheezing, no crackles. Normal respiratory effort. No accessory muscle use.  Cardiovascular: Regular rate and rhythm, no murmurs / rubs / gallops. No extremity edema. 2+ pedal pulses. No carotid bruits.  Abdomen: no tenderness, no masses palpated. No hepatosplenomegaly. Bowel sounds positive.  Musculoskeletal: no clubbing / cyanosis. No joint deformity upper and lower extremities. Good ROM, no contractures. Normal muscle tone.  Skin: no rashes, lesions, ulcers. No induration Neurologic: CN 2-12 grossly intact.  Patient noted to have 4+/5 strength in the right upper and lower extremity.  Strength 5/5 in left upper and lower extremity. Psychiatric: Normal judgment and insight. Alert and oriented x 3. Normal mood.   Data Reviewed:  EKG revealed normal sinus rhythm at 86 bpm with first-degree heart block. Assessment and Plan: Weakness Gait disturbance Acute on chronic.  Patient presented due to reports of being unable to ambulate with concern for increased weakness on her right side that had previously been  affected by a stroke back in 2018 - Admit to a telemetry bed - Check MRI of the brain - PT/OT to evaluate and treat - Will warrant further workup if found to have concerns for stroke  Suspected sepsis secondary to community-acquired pneumonia Patient reported fever up to 100.6 F with EMS and initial white blood cell count elevated 12.9 meeting SIRS criteria.  Chest x-ray showed faint interstitial densities thought to represent atelectasis versus atypical pneumonia not excluded.  Lactic acid was reassuring.  Review of EMS run sheet revealed patient's O2 saturation dropped as low as 89% on room air and she was placed on 2 L of nasal cannula oxygen. - Aspiration precautions with elevation head of bed  - Continuous pulse oximetry with nasal cannula oxygen maintain O2 saturation greater than 90% - Incentive spirometry and flutter valve - Follow-up blood cultures - Check procalcitonin - Continue empiric antibiotics of Rocephin  and doxycycline  - Mucinex  - Recheck CBC tomorrow  History of CVA with residual deficit Patient had a prior  hemorrhagic left thalamic stroke back in 2008 with residual right-sided weakness.  Question possibility of recrudescence secondary to acute infection.  Essential hypertension Blood pressures were noted to be maintained. - Sitter resuming home blood pressure regimen in a.m. if no signs of stroke on MRI  Hyperlipidemia - Continue Crestor   Anxiety - Continue Zoloft   Chronic pain - Continue pain regimen  GERD - Continue Protonix   DVT prophylaxis: Lovenox  Advance Care Planning:   Code Status: Full Code    Consults: None  Family Communication: None  Severity of Illness: The appropriate patient status for this patient is OBSERVATION. Observation status is judged to be reasonable and necessary in order to provide the required intensity of service to ensure the patient's safety. The patient's presenting symptoms, physical exam findings, and initial  radiographic and laboratory data in the context of their medical condition is felt to place them at decreased risk for further clinical deterioration. Furthermore, it is anticipated that the patient will be medically stable for discharge from the hospital within 2 midnights of admission.   Author: Maximino DELENA Sharps, MD 11/11/2023 4:37 PM  For on call review [email or web address].

## 2023-11-11 NOTE — ED Notes (Signed)
 [facility] called.

## 2023-11-12 ENCOUNTER — Encounter: Payer: Self-pay | Admitting: Hematology and Oncology

## 2023-11-12 ENCOUNTER — Other Ambulatory Visit (HOSPITAL_COMMUNITY): Payer: Self-pay

## 2023-11-12 DIAGNOSIS — J189 Pneumonia, unspecified organism: Secondary | ICD-10-CM

## 2023-11-12 DIAGNOSIS — R531 Weakness: Secondary | ICD-10-CM | POA: Diagnosis not present

## 2023-11-12 LAB — BASIC METABOLIC PANEL WITH GFR
Anion gap: 7 (ref 5–15)
BUN: 14 mg/dL (ref 8–23)
CO2: 29 mmol/L (ref 22–32)
Calcium: 8.9 mg/dL (ref 8.9–10.3)
Chloride: 103 mmol/L (ref 98–111)
Creatinine, Ser: 0.89 mg/dL (ref 0.44–1.00)
GFR, Estimated: >60 mL/min (ref >60)
Glucose, Bld: 99 mg/dL (ref 70–99)
Potassium: 4.2 mmol/L (ref 3.5–5.1)
Sodium: 139 mmol/L (ref 135–145)

## 2023-11-12 LAB — CBC
HCT: 36.4 % (ref 36.0–46.0)
Hemoglobin: 11.4 g/dL — ABNORMAL LOW (ref 12.0–15.0)
MCH: 27.7 pg (ref 26.0–34.0)
MCHC: 31.3 g/dL (ref 30.0–36.0)
MCV: 88.3 fL (ref 80.0–100.0)
Platelets: 146 K/uL — ABNORMAL LOW (ref 150–400)
RBC: 4.12 MIL/uL (ref 3.87–5.11)
RDW: 14.1 % (ref 11.5–15.5)
WBC: 12 K/uL — ABNORMAL HIGH (ref 4.0–10.5)
nRBC: 0 % (ref 0.0–0.2)

## 2023-11-12 MED ORDER — DOXYCYCLINE HYCLATE 100 MG PO TABS: 100.0000 mg | ORAL_TABLET | Freq: Two times a day (BID) | ORAL | 0 refills | Status: DC

## 2023-11-12 MED ORDER — DOXYCYCLINE HYCLATE 100 MG PO TABS
100.0000 mg | ORAL_TABLET | Freq: Two times a day (BID) | ORAL | 0 refills | Status: AC
Start: 2023-11-12 — End: 2023-11-17
  Filled 2023-11-12: qty 10, 5d supply, fill #0

## 2023-11-12 NOTE — Progress Notes (Signed)
 TOC meds in packet with AVS

## 2023-11-12 NOTE — Discharge Instructions (Signed)
 Please continue your antibiotic until it is completed.  Follow up with your primary doctor in about 1-2 weeks to review how you are  recovering

## 2023-11-12 NOTE — Progress Notes (Incomplete)
 PROGRESS NOTE  Miranda Harrington    DOB: June 04, 1940, 83 y.o.  FMW:[id number]    Code Status: Full Code   DOA: 11/11/2023   LOS: 0   Brief hospital course  Miranda Harrington is a 83 y.o. female with a PMH significant for  hypertension, hyperlipidemia, CVA with right-sided deficits, and anxiety presents with inability to walk and fever.    She experienced an inability to walk this morning. She woke up around 9:15 AM and was unable to take steps, particularly affecting her right side, which was previously impacted by a thalamic stroke in 2008. She typically uses a cane or walker when out, but does not usually use them to go to the bathroom.  Patient reported it felt like her leg was just unable to function properly.  Ultimately she was able to get to and from the bathroom, but was very concerned that she may be having a stroke.  Patient notes that she has been having progressive weakness over this past week.     She mentions experiencing a fever this morning, with a recorded temperature of 100.41F at home. No recent cough, shortness of breath, chest pain, nausea, vomiting, or diarrhea. She also denies any issues with urination, such as burning or discomfort.   With EMS patient was noted to have a temperature of 100.6 F.  Review of EMS records note O2 saturations dropped down as low as 89% on room air for which she was placed on nasal cannula oxygen.   In the emergency department patient was noted to have temperature up to 100 F with vital signs otherwise maintained.  Labs significant for WBC 12.9 and lactic acid 0.9.  Chest x-ray noted faint interstitial densities thought to represent atelectasis versus atypical pneumonia not excluded.  Influenza, COVID-19, and RSV screening were negative.  Blood and urine cultures have been obtained.  Patient was given acetaminophen  650 mg p.o., 1 L of lactated Ringer 's, doxycycline , and Rocephin ..  They presented from *** to the ED on 11/11/2023 with *** x *** days.  ***  In the ED, it was found that they had ***.  Significant findings included ***.  They were initially treated with ***.   Patient was admitted to medicine service for further workup and management of *** as outlined in detail below.  11/12/23 -***  Assessment & Plan  Principal Problem:   [name] Active Problems:   Gait disturbance   Sepsis due to pneumonia (HCC)   History of hemorrhagic stroke with residual hemiplegia (HCC)   Benign essential HTN   HLD (hyperlipidemia)   Anxiety   Chronic pain   GERD (gastroesophageal reflux disease)  Weakness Gait disturbance Acute on chronic.  Patient presented due to reports of being unable to ambulate with concern for increased weakness on her right side that had previously been affected by a stroke back in 2018 - Admit to a telemetry bed - Check MRI of the brain - PT/OT to evaluate and treat - Will warrant further workup if found to have concerns for stroke   Suspected sepsis secondary to community-acquired pneumonia Patient reported fever up to 100.6 F with EMS and initial white blood cell count elevated 12.9 meeting SIRS criteria.  Chest x-ray showed faint interstitial densities thought to represent atelectasis versus atypical pneumonia not excluded.  Lactic acid was reassuring.  Review of EMS run sheet revealed patient's O2 saturation dropped as low as 89% on room air and she was placed on 2 L of nasal cannula  oxygen. - Aspiration precautions with elevation head of bed  - Continuous pulse oximetry with nasal cannula oxygen maintain O2 saturation greater than 90% - Incentive spirometry and flutter valve - Follow-up blood cultures - Check procalcitonin - Continue empiric antibiotics of Rocephin  and doxycycline  - Mucinex  - Recheck CBC tomorrow   History of CVA with residual deficit Patient had a prior  hemorrhagic left thalamic stroke back in 2008 with residual right-sided weakness.  Question possibility of  recrudescence secondary to acute infection.   Essential hypertension Blood pressures were noted to be maintained. - Sitter resuming home blood pressure regimen in a.m. if no signs of stroke on MRI   Hyperlipidemia - Continue Crestor    Anxiety - Continue Zoloft    Chronic pain - Continue pain regimen   GERD - Continue Protonix   Body mass index is 30.99 kg/m.  VTE ppx: enoxaparin  (LOVENOX ) injection 40 mg Start: 11/12/23 1000   Diet:     Diet   Diet Heart Room service appropriate? Yes; Fluid consistency: Thin   Consultants: ***  Subjective 11/12/23    Pt reports ***   Objective  Blood pressure (!) 150/63, pulse 63, temperature 99 F (37.2 C), temperature source Oral, resp. rate 18, height 5' 1 (1.549 m), SpO2 95%.  Intake/Output Summary (Last 24 hours) at 11/12/2023 0737 Last data filed at 11/12/2023 0300 Gross per 24 hour  Intake 200 ml  Output --  Net 200 ml   There were no vitals filed for this visit.   Physical Exam: *** General: awake, alert, NAD HEENT: atraumatic, clear conjunctiva, anicteric sclera, MMM, hearing grossly normal Respiratory: normal respiratory effort. Cardiovascular: quick capillary refill, normal S1/S2, RRR, no JVD, murmurs Gastrointestinal: soft, NT, ND Nervous: A&O x3. no gross focal neurologic deficits, normal speech Extremities: moves all equally, no edema, normal tone Skin: dry, intact, normal temperature, normal color. No rashes, lesions or ulcers on exposed skin Psychiatry: normal mood, congruent affect  Labs   I have personally reviewed the following labs and imaging studies CBC    Component Value Date/Time   WBC 12.0 (H) 11/12/2023 0412   RBC 4.12 11/12/2023 0412   HGB 11.4 (L) 11/12/2023 0412   HGB 11.2 (L) 08/12/2023 1403   HCT 36.4 11/12/2023 0412   PLT 146 (L) 11/12/2023 0412   PLT 195 08/12/2023 1403   MCV 88.3 11/12/2023 0412   MCH 27.7 11/12/2023 0412   MCHC 31.3 11/12/2023 0412   RDW 14.1 11/12/2023 0412    LYMPHSABS 0.8 11/11/2023 1424   MONOABS 0.8 11/11/2023 1424   EOSABS 0.0 11/11/2023 1424   BASOSABS 0.0 11/11/2023 1424      Latest Ref Rng & Units 11/12/2023    4:12 AM 11/11/2023    2:24 PM 07/12/2023   10:09 PM  BMP  Glucose 70 - 99 mg/dL 99  895  897   BUN 8 - 23 mg/dL 14  16  44   Creatinine 0.44 - 1.00 mg/dL 9.10  9.03  8.99   Sodium 135 - 145 mmol/L 139  140  138   Potassium 3.5 - 5.1 mmol/L 4.2  4.0  3.4   Chloride 98 - 111 mmol/L 103  99  104   CO2 22 - 32 mmol/L 29  30    Calcium  8.9 - 10.3 mg/dL 8.9  9.0      MR BRAIN WO CONTRAST Result Date: 11/11/2023 CLINICAL DATA:  Stroke, follow up EXAM: MRI HEAD WITHOUT CONTRAST TECHNIQUE: Multiplanar, multiecho pulse sequences of the brain and  surrounding structures were obtained without intravenous contrast. COMPARISON:  None Available. FINDINGS: Brain: Sequela of prior hemorrhages in the left basal ganglia and thalamus region with associated hemosiderin deposition and encephalomalacia. No evidence of acute infarct, acute hemorrhage, mass lesion, midline shift or hydrocephalus. Vascular: Major arterial flow voids are maintained at the skull base. Skull and upper cervical spine: Normal marrow signal. Sinuses/Orbits: Negative. IMPRESSION: 1. No evidence of acute intracranial abnormality. 2. Sequela of prior hemorrhages in the left basal ganglia and thalamus region. Electronically Signed   By: Gilmore GORMAN Molt M.D.   On: 11/11/2023 19:36   DG Chest 2 View Result Date: 11/11/2023 CLINICAL DATA:  Fever. EXAM: CHEST - 2 VIEW COMPARISON:  Chest radiograph dated 01/16/2023. FINDINGS: Faint interstitial densities may represent atelectasis. Atypical pneumonia is not excluded. No focal consolidation, pleural effusion or pneumothorax. Stable cardiac silhouette. No acute osseous pathology. Right shoulder arthroplasty. IMPRESSION: Faint interstitial densities may represent atelectasis. Atypical pneumonia is not excluded. Electronically Signed   By: Vanetta Chou M.D.   On: 11/11/2023 14:57    Disposition Plan & Communication  Patient status: Observation  Admitted From: {From:23814} Planned disposition location: {PLAN; DISPOSITION:26386} Anticipated discharge date: *** pending ***  Family Communication: ***    Author: Marien LITTIE Piety, DO [facility] 11/12/2023, 7:37 AM   Available by Epic secure chat 7AM-7PM. If 7PM-7AM, please contact night-coverage.  [facility] contact information found on [email or web address].

## 2023-11-12 NOTE — Evaluation (Signed)
 Occupational Therapy Evaluation Patient Details Name: Miranda Harrington MRN: [id number] DOB: February 07, 1941 Today's Date: 11/12/2023   History of Present Illness   83 y.o. female presents with inability to walk and fever.   Chest x-ray noted faint interstitial densities thought to represent atelectasis versus atypical pneumonia MRI with no acute intracranial abnormality. PMH hypertension, hyperlipidemia, CVA with right-sided deficits, and anxiety     Clinical Impressions PTA pt lives alone independently and has family who assist with errands and driving when she is not using her car service. Pt reports baseline pain/sensory deficit on her R side since her stroke. Reports pain in foot is worse and caused difficulty with ambulation however able to ambulate and complete ADL tasks with S during assessment. Recommend HHOT for home safety eval to maximize functional level of independence and reduce risk of falls. No further acute OT needed.      If plan is discharge home, recommend the following:   Help with stairs or ramp for entrance     Functional Status Assessment   Patient has had a recent decline in their functional status and demonstrates the ability to make significant improvements in function in a reasonable and predictable amount of time.     Equipment Recommendations   BSC/3in1;Other (comment) (RW)     Recommendations for Other Services         Precautions/Restrictions   Precautions Precautions: Fall     Mobility Bed Mobility Overal bed mobility: Needs Assistance Bed Mobility: Supine to Sit     Supine to sit: Contact guard          Transfers Overall transfer level: Needs assistance   Transfers: Sit to/from Stand Sit to Stand: Supervision                  Balance Overall balance assessment: Mild deficits observed, not formally tested                                         ADL either performed or assessed with clinical judgement    ADL Overall ADL's : Needs assistance/impaired     Grooming: Supervision/safety;Standing   Upper Body Bathing: Set up;Sitting   Lower Body Bathing: Supervison/ safety;Sit to/from stand   Upper Body Dressing : Set up;Sitting   Lower Body Dressing: Supervision/safety;Sit to/from stand   Toilet Transfer: Contact guard assist;Ambulation   Toileting- Clothing Manipulation and Hygiene: Modified independent       Functional mobility during ADLs: Supervision/safety;Rolling walker (2 wheels);Cueing for safety       Vision   Vision Assessment?: Wears glasses for reading Additional Comments: no glasses available     Perception Perception: Within Functional Limits       Praxis Praxis: WFL       Pertinent Vitals/Pain Pain Assessment Pain Assessment: 0-10 Pain Score: 5  Pain Location: R foot Pain Descriptors / Indicators: Pins and needles, Pounding Pain Intervention(s): Limited activity within patient's tolerance     Extremity/Trunk Assessment Upper Extremity Assessment Upper Extremity Assessment: Right hand dominant;Overall WFL for tasks assessed (hx of R sensory deficits since stroke)   Lower Extremity Assessment Lower Extremity Assessment: Defer to PT evaluation (pain in arch and toe base)   Cervical / Trunk Assessment Cervical / Trunk Assessment: Normal   Communication Communication Communication: No apparent difficulties   Cognition Arousal: Alert Behavior During Therapy: WFL for tasks assessed/performed Cognition: No apparent impairments  Following commands: Intact       Cueing  General Comments          Exercises     Shoulder Instructions      Home Living Family/patient expects to be discharged to:: Private residence Living Arrangements: Alone Available Help at Discharge: Family;Available PRN/intermittently Type of Home: House Home Access: Stairs to enter [employer] of Steps:  1 Entrance Stairs-Rails: None Home Layout: One level     Bathroom Shower/Tub: Producer, television/film/video: Handicapped height Bathroom Accessibility:  (not sure)   Home Equipment: Grab bars - tub/shower;Cane - single point;Rollator (4 wheels);Shower seat          Prior Functioning/Environment Prior Level of Function : Independent/Modified Independent (has car service for errands and appointments)             Mobility Comments: uses cane ADLs Comments: independent with ADL and IADL Has a cleaning lady   OT Problem List: Decreased range of motion   OT Treatment/Interventions:        OT Goals(Current goals can be found in the care plan section)   Acute Rehab OT Goals Patient Stated Goal: Home OT Goal Formulation: All assessment and education complete, DC therapy   OT Frequency:       Co-evaluation              AM-PAC OT 6 Clicks Daily Activity     Outcome Measure Help from another person eating meals?: None Help from another person taking care of personal grooming?: A Little Help from another person toileting, which includes using toliet, bedpan, or urinal?: A Little Help from another person bathing (including washing, rinsing, drying)?: A Little Help from another person to put on and taking off regular upper body clothing?: A Little Help from another person to put on and taking off regular lower body clothing?: A Little 6 Click Score: 19   End of Session Equipment Utilized During Treatment: Gait belt;Rolling walker (2 wheels) Nurse Communication: Mobility status  Activity Tolerance: Patient tolerated treatment well Patient left: Other (comment) (ambulating with PT)  OT Visit Diagnosis: Unsteadiness on feet (R26.81)                Time: 9054-8989 OT Time Calculation (min): 25 min Charges:  OT General Charges $OT Visit: 1 Visit OT Evaluation $OT Eval Low Complexity: 1 Low  Brogen Duell, OT/L   Acute OT Clinical Specialist Acute  Rehabilitation Services Pager [phone] Office [phone]   [facility] 11/12/2023, 10:36 AM

## 2023-11-12 NOTE — Evaluation (Signed)
 Physical Therapy Evaluation Patient Details Name: Miranda Harrington MRN: [id number] DOB: 23-Nov-1940 Today's Date: 11/12/2023  History of Present Illness  83 y.o. female presents with inability to walk and fever.   Chest x-ray noted faint interstitial densities thought to represent atelectasis versus atypical pneumonia MRI with no acute intracranial abnormality. PMH hypertension, hyperlipidemia, CVA with right-sided deficits, and anxiety  Clinical Impression  PTA pt living alone in single story home with 1 step to enter. Pt reports independence with ambulation in home, uses cane or grocery cart for support with longer community ambulation, using a car service for transportation. Pt is independent with ADLs and iADLs. Pt is currently limited in safe mobility by increased R foot pain especially in weight bearing. Pt tried using a RW for ambulation in hallway and reports feeling more stable and able to reduce weight through her R foot. Pt ambulates at contact guard level. PT recommending HHPT and RW at discharge. PT will continue to see pt acutely.       If plan is discharge home, recommend the following: Assistance with cooking/housework   Can travel by private vehicle    Yes    Equipment Recommendations Rolling walker (2 wheels)     Functional Status Assessment Patient has had a recent decline in their functional status and demonstrates the ability to make significant improvements in function in a reasonable and predictable amount of time.     Precautions / Restrictions Precautions Precautions: Fall Restrictions Weight Bearing Restrictions Per Provider Order: No      Mobility  Ambulation/Gait Ambulation/Gait assistance: Contact guard assist Gait Distance (Feet): 100 Feet Assistive device: Rolling walker (2 wheels) Gait Pattern/deviations: Decreased dorsiflexion - right, Shuffle, Step-through pattern, Decreased weight shift to right Gait velocity: slowed Gait velocity interpretation:  <1.8 ft/sec, indicate of risk for recurrent falls   General Gait Details: contact guard for safety with RW, pt reports continued R foot pain especially with walking in sock feet, but also she has less R foot pain when using RW for support, pt with decreased weightshift to R, vc for proximity to RW and keeping RW on the floor        Balance Overall balance assessment: Mild deficits observed, not formally tested                                           Pertinent Vitals/Pain Pain Assessment Pain Assessment: 0-10 Pain Score: 5  Pain Location: R metarsals and arch of foot with weightbearing Pain Descriptors / Indicators: Pins and needles, Pounding Pain Intervention(s): Limited activity within patient's tolerance, Monitored during session, Repositioned    Home Living Family/patient expects to be discharged to:: Private residence Living Arrangements: Alone Available Help at Discharge: Family;Available PRN/intermittently Type of Home: House Home Access: Stairs to enter Entrance Stairs-Rails: None Entrance Stairs-Number of Steps: 1   Home Layout: One level Home Equipment: Grab bars - tub/shower;Cane - single point;Rollator (4 wheels);Shower seat      Prior Function Prior Level of Function : Independent/Modified Independent (has car service for errands and appointments)             Mobility Comments: uses cane ADLs Comments: independent with ADL and IADL     Extremity/Trunk Assessment   Upper Extremity Assessment Upper Extremity Assessment: Defer to OT evaluation    Lower Extremity Assessment Lower Extremity Assessment: RLE deficits/detail;LLE deficits/detail RLE Deficits / Details: lacks ~10  degrees of dorsiflexion, PROM of ankle into dorsiflexion is painful, strength grossly 3+/5 (which is slightly less than L) RLE Sensation:  (increased neuropathic pain in R foot) LLE Deficits / Details: generalized weakness, ROM WFL    Cervical / Trunk  Assessment Cervical / Trunk Assessment: Normal  Communication   Communication Communication: No apparent difficulties    Cognition Arousal: Alert Behavior During Therapy: WFL for tasks assessed/performed                           PT - Cognition Comments: reports to OT that pain in her R foot is one of the main reasons for calling EMS, however when PT inquires she states that she has had the R foot pain since her stroke Following commands: Intact             General Comments General comments (skin integrity, edema, etc.): VSS on RA        Assessment/Plan    PT Assessment Patient needs continued PT services  PT Problem List Decreased range of motion;Decreased balance;Impaired sensation;Pain       PT Treatment Interventions DME instruction;Gait training;Stair training;Functional mobility training;Therapeutic activities;Therapeutic exercise;Balance training;Cognitive remediation;Patient/family education    PT Goals (Current goals can be found in the Care Plan section)  Acute Rehab PT Goals PT Goal Formulation: With patient Time For Goal Achievement: 11/26/23 Potential to Achieve Goals: Fair    Frequency Min 3X/week        AM-PAC PT 6 Clicks Mobility  Outcome Measure Help needed turning from your back to your side while in a flat bed without using bedrails?: None Help needed moving from lying on your back to sitting on the side of a flat bed without using bedrails?: None Help needed moving to and from a bed to a chair (including a wheelchair)?: A Little Help needed standing up from a chair using your arms (e.g., wheelchair or bedside chair)?: A Little Help needed to walk in hospital room?: A Little Help needed climbing 3-5 steps with a railing? : A Lot 6 Click Score: 19    End of Session Equipment Utilized During Treatment: Gait belt Activity Tolerance: Patient limited by pain Patient left: in chair;with call bell/phone within reach;with nursing/sitter  in room;with chair alarm set Nurse Communication: Mobility status PT Visit Diagnosis: Unsteadiness on feet (R26.81);Other abnormalities of gait and mobility (R26.89);Muscle weakness (generalized) (M62.81);Pain Pain - Right/Left: Right Pain - part of body: Ankle and joints of foot    Time: 8982-8966 PT Time Calculation (min) (ACUTE ONLY): 16 min   Charges:   PT Evaluation $PT Eval Low Complexity: 1 Low   PT General Charges $$ ACUTE PT VISIT: 1 Visit         Kitt Minardi B. Harrington Lapidus PT, DPT Acute Rehabilitation Services Please use secure chat or  Call Office [phone]   Miranda Harrington [facility] 11/12/2023, 11:33 AM

## 2023-11-12 NOTE — Plan of Care (Signed)

## 2023-11-12 NOTE — Discharge Summary (Signed)
 Physician Discharge Summary  Patient: Miranda Harrington FMW:[id number] DOB: 09/23/1940   Code Status: Full Code Admit date: 11/11/2023 Discharge date: 11/12/2023 Disposition: Home, PT and OT PCP: Chrystal Lamarr RAMAN, MD  Recommendations for Outpatient Follow-up:  Follow up with PCP within 1-2 weeks Regarding general hospital follow up and preventative care Recommend continuing PT/OT  Discharge Diagnoses:  Principal Problem:   Weakness-Right Hand and Foot Active Problems:   Gait disturbance   Sepsis due to pneumonia (HCC)   History of hemorrhagic stroke with residual hemiplegia (HCC)   Benign essential HTN   HLD (hyperlipidemia)   Anxiety   Chronic pain   GERD (gastroesophageal reflux disease)   Pneumonia of both lower lobes due to infectious organism  Brief Hospital Course Summary: Miranda Harrington is a 83 y.o. female with a PMH significant for  hypertension, hyperlipidemia, CVA with right-sided deficits, and anxiety presents with inability to walk and fever.  Her workup was negative for acute stroke on head CT or brain MRI and her symptoms quickly improved.  She was found to have sepsis from Community acquired pneumonia as seen on chest imaging and evidenced by fever, new O2 requirement of 2L [place], leukocytosis. COVID, RSV, flu negative.  She was treated with IV Abx and supportive care and was able to quickly wean from the O2 and experienced symptom improvement.  Blood cultures remained negative and she was afebrile >24 hours. She was transitioned to PO antibiotics and discharged home in stable condition.  PT/OT evaluated her and recommended  HH PT and OT.    All other chronic conditions were treated with home medications.    Discharge Condition: Good, improved Recommended discharge diet: Regular healthy diet  Consultations: None   Procedures/Studies: Brain MRI  Allergies as of 11/12/2023       Reactions   Bee Venom Anaphylaxis, Hives   Sulfa Antibiotics Hives    Erythromycin Hives   Other    Other reaction(s): Other (See Comments) MYCIN   Demerol [meperidine] Other (See Comments)   hallucination   Hydroxychloroquine Rash        Medication List     STOP taking these medications    hydrochlorothiazide  12.5 MG capsule Commonly known as: MICROZIDE        TAKE these medications    acetaminophen  500 MG tablet Commonly known as: TYLENOL  Take 1,000 mg by mouth every 6 (six) hours as needed.   doxycycline  100 MG tablet Commonly known as: VIBRA -TABS Take 1 tablet (100 mg total) by mouth 2 (two) times daily for 5 days.   ezetimibe  10 MG tablet Commonly known as: ZETIA  Take 10 mg by mouth daily.   golimumab 2 mg/kg in sodium chloride  0.9 % Inject 1 application into the vein. Every other month.   HYDROcodone -acetaminophen  10-325 MG tablet Commonly known as: NORCO Take 1 tablet by mouth 3 (three) times daily as needed.   lidocaine  4 % Place 1 patch onto the skin daily as needed (pain). Apply to right foot   multivitamin tablet Take 1 tablet by mouth daily.   pantoprazole  40 MG tablet Commonly known as: Protonix  Take 1 tablet (40 mg total) by mouth 2 (two) times daily.   pregabalin  75 MG capsule Commonly known as: Lyrica  TAKE 1 CAPSULE BY MOUTH 2 TIMES DAILY What changed:  how much to take how to take this when to take this additional instructions   ramipril  10 MG capsule Commonly known as: ALTACE  Take 10 mg by mouth 2 (two) times daily.  rosuvastatin  40 MG tablet Commonly known as: CRESTOR  Take 40 mg by mouth at bedtime.   sertraline  50 MG tablet Commonly known as: ZOLOFT  Take 75 mg by mouth daily.        Follow-up Information     Chrystal Lamarr RAMAN, MD. Schedule an appointment as soon as possible for a visit in 1 week(s).   Specialty: Family Medicine Contact information: [address] [place] [address] [phone]                 Subjective   Pt reports feeling well. Denies  dyspnea on exertion or chest pain. Her weakness has improved.   All questions and concerns were addressed at time of discharge.  Objective  Blood pressure 111/69, pulse 89, temperature 98.2 F (36.8 C), temperature source Oral, resp. rate 18, height 5' 1 (1.549 m), SpO2 100%.   General: Pt is alert, awake, not in acute distress Cardiovascular: RRR, S1/S2 +, no rubs, no gallops Respiratory: CTA bilaterally, no wheezing, no rhonchi Abdominal: Soft, NT, ND, bowel sounds + Extremities: no edema, no cyanosis  The results of significant diagnostics from this hospitalization (including imaging, microbiology, ancillary and laboratory) are listed below for reference.   Imaging studies: MR BRAIN WO CONTRAST Result Date: 11/11/2023 CLINICAL DATA:  Stroke, follow up EXAM: MRI HEAD WITHOUT CONTRAST TECHNIQUE: Multiplanar, multiecho pulse sequences of the brain and surrounding structures were obtained without intravenous contrast. COMPARISON:  None Available. FINDINGS: Brain: Sequela of prior hemorrhages in the left basal ganglia and thalamus region with associated hemosiderin deposition and encephalomalacia. No evidence of acute infarct, acute hemorrhage, mass lesion, midline shift or hydrocephalus. Vascular: Major arterial flow voids are maintained at the skull base. Skull and upper cervical spine: Normal marrow signal. Sinuses/Orbits: Negative. IMPRESSION: 1. No evidence of acute intracranial abnormality. 2. Sequela of prior hemorrhages in the left basal ganglia and thalamus region. Electronically Signed   By: Gilmore RAMAN Molt M.D.   On: 11/11/2023 19:36   DG Chest 2 View Result Date: 11/11/2023 CLINICAL DATA:  Fever. EXAM: CHEST - 2 VIEW COMPARISON:  Chest radiograph dated 01/16/2023. FINDINGS: Faint interstitial densities may represent atelectasis. Atypical pneumonia is not excluded. No focal consolidation, pleural effusion or pneumothorax. Stable cardiac silhouette. No acute osseous pathology. Right  shoulder arthroplasty. IMPRESSION: Faint interstitial densities may represent atelectasis. Atypical pneumonia is not excluded. Electronically Signed   By: Vanetta Chou M.D.   On: 11/11/2023 14:57    Labs: Basic Metabolic Panel: Recent Labs  Lab 11/11/23 1424 11/12/23 0412  NA 140 139  K 4.0 4.2  CL 99 103  CO2 30 29  GLUCOSE 104* 99  BUN 16 14  CREATININE 0.96 0.89  CALCIUM  9.0 8.9   CBC: Recent Labs  Lab 11/11/23 1424 11/12/23 0412  WBC 12.9* 12.0*  NEUTROABS 11.2*  --   HGB 12.6 11.4*  HCT 40.8 36.4  MCV 89.3 88.3  PLT 154 146*   Microbiology: Results for orders placed or performed during the hospital encounter of 11/11/23  Resp panel by RT-PCR (RSV, Flu A&B, Covid) Anterior Nasal Swab     Status: None   Collection Time: 11/11/23  2:30 PM   Specimen: Anterior Nasal Swab  Result Value Ref Range Status   SARS Coronavirus 2 by RT PCR NEGATIVE NEGATIVE Final   Influenza A by PCR NEGATIVE NEGATIVE Final   Influenza B by PCR NEGATIVE NEGATIVE Final    Comment: (NOTE) The Xpert Xpress SARS-CoV-2/FLU/RSV plus assay is intended as an aid  in the diagnosis of influenza from Nasopharyngeal swab specimens and should not be used as a sole basis for treatment. Nasal washings and aspirates are unacceptable for Xpert Xpress SARS-CoV-2/FLU/RSV testing.  Fact Sheet for Patients: BloggerCourse.com  Fact Sheet for Healthcare Providers: SeriousBroker.it  This test is not yet approved or cleared by the [place]  FDA and has been authorized for detection and/or diagnosis of SARS-CoV-2 by FDA under an Emergency Use Authorization (EUA). This EUA will remain in effect (meaning this test can be used) for the duration of the COVID-19 declaration under Section 564(b)(1) of the Act, 21 U.S.C. section 360bbb-3(b)(1), unless the authorization is terminated or revoked.     Resp Syncytial Virus by PCR NEGATIVE NEGATIVE Final     Comment: (NOTE) Fact Sheet for Patients: BloggerCourse.com  Fact Sheet for Healthcare Providers: SeriousBroker.it  This test is not yet approved or cleared by the [place]  FDA and has been authorized for detection and/or diagnosis of SARS-CoV-2 by FDA under an Emergency Use Authorization (EUA). This EUA will remain in effect (meaning this test can be used) for the duration of the COVID-19 declaration under Section 564(b)(1) of the Act, 21 U.S.C. section 360bbb-3(b)(1), unless the authorization is terminated or revoked.  Performed at [facility], [address], [place], [place] [address]   Blood culture (routine x 2)     Status: None (Preliminary result)   Collection Time: 11/11/23  3:45 PM   Specimen: BLOOD  Result Value Ref Range Status   Specimen Description BLOOD SITE NOT SPECIFIED  Final   Special Requests   Final    BOTTLES DRAWN AEROBIC AND ANAEROBIC Blood Culture adequate volume   Culture   Final    NO GROWTH < 24 HOURS Performed at [facility], [address], [place], [place] [address]    Report Status PENDING  Incomplete  Blood culture (routine x 2)     Status: None (Preliminary result)   Collection Time: 11/11/23  3:45 PM   Specimen: BLOOD  Result Value Ref Range Status   Specimen Description BLOOD SITE NOT SPECIFIED  Final   Special Requests   Final    BOTTLES DRAWN AEROBIC AND ANAEROBIC Blood Culture adequate volume   Culture   Final    NO GROWTH < 24 HOURS Performed at [facility], [address], [place], [place] [address]    Report Status PENDING  Incomplete    Time coordinating discharge: Over 30 minutes  Marien LITTIE Piety, MD  [facility] 11/12/2023, 2:13 PM

## 2023-11-12 NOTE — Care Management Obs Status (Signed)
 MEDICARE OBSERVATION STATUS NOTIFICATION   Patient Details  Name: Miranda Harrington MRN: [id number] Date of Birth: 07-11-1940   Medicare Observation Status Notification Given:  Yes Obs notice signed and copy given    Claretta Deed 11/12/2023, 3:54 PM

## 2023-11-16 LAB — CULTURE, BLOOD (ROUTINE X 2)
Culture: NO GROWTH
Culture: NO GROWTH
Special Requests: ADEQUATE
Special Requests: ADEQUATE

## 2023-11-18 NOTE — Progress Notes (Signed)
 11/18/2023 at 1458  Received phone call from [facility] office that pt didn't have HH arranged at d/c on Friday 11/12/2023. CM reached out to patients daughter and provided choice. HH arranged with [facility]. Leopoldo will contact her for the first home visit.  She also needed a walker. Verbal order given per Dr Lenon for walker through [facility]. [employer] to deliver the DME to patient's home.

## 2024-02-07 ENCOUNTER — Inpatient Hospital Stay: Attending: Hematology and Oncology

## 2024-02-07 DIAGNOSIS — D509 Iron deficiency anemia, unspecified: Secondary | ICD-10-CM | POA: Diagnosis present

## 2024-02-07 LAB — CBC WITH DIFFERENTIAL (CANCER CENTER ONLY)
Abs Immature Granulocytes: 0.03 K/uL (ref 0.00–0.07)
Basophils Absolute: 0 K/uL (ref 0.0–0.1)
Basophils Relative: 1 %
Eosinophils Absolute: 0.1 K/uL (ref 0.0–0.5)
Eosinophils Relative: 1 %
HCT: 39.9 % (ref 36.0–46.0)
Hemoglobin: 12.8 g/dL (ref 12.0–15.0)
Immature Granulocytes: 0 %
Lymphocytes Relative: 35 %
Lymphs Abs: 2.4 K/uL (ref 0.7–4.0)
MCH: 28.1 pg (ref 26.0–34.0)
MCHC: 32.1 g/dL (ref 30.0–36.0)
MCV: 87.7 fL (ref 80.0–100.0)
Monocytes Absolute: 0.6 K/uL (ref 0.1–1.0)
Monocytes Relative: 8 %
Neutro Abs: 3.8 K/uL (ref 1.7–7.7)
Neutrophils Relative %: 55 %
Platelet Count: 195 K/uL (ref 150–400)
RBC: 4.55 MIL/uL (ref 3.87–5.11)
RDW: 14.5 % (ref 11.5–15.5)
WBC Count: 6.8 K/uL (ref 4.0–10.5)
nRBC: 0 % (ref 0.0–0.2)

## 2024-02-07 LAB — IRON AND IRON BINDING CAPACITY (CC-WL,HP ONLY)
Iron: 71 ug/dL (ref 28–170)
Saturation Ratios: 16 % (ref 10.4–31.8)
TIBC: 440 ug/dL (ref 250–450)
UIBC: 368 ug/dL

## 2024-02-07 LAB — FERRITIN: Ferritin: 21 ng/mL (ref 11–307)

## 2024-02-09 ENCOUNTER — Inpatient Hospital Stay (HOSPITAL_BASED_OUTPATIENT_CLINIC_OR_DEPARTMENT_OTHER): Admitting: Hematology and Oncology

## 2024-02-09 DIAGNOSIS — D509 Iron deficiency anemia, unspecified: Secondary | ICD-10-CM | POA: Diagnosis not present

## 2024-02-09 NOTE — Assessment & Plan Note (Signed)
 Lab review: 02/26/2022: Hemoglobin 10.4, MCV 84 08/13/2022: Hemoglobin 9, MCV 80, creatinine 1.02, iron  saturation 5%, TIBC 464 09/12/2022: Hemoglobin 8.6, MCV 78.4, SPEP: Normal, reticulocyte: 1.3%, reticulocyte hemoglobin 20.6, LDH 169, iron  saturation 3%, TIBC 532, ferritin 5, DAT negative, B12: 908, Folate: 45.8, Erythropoietin : 45.8 12/18/2022: Hemoglobin 11.8, MCV 84.8, platelets 166, iron  saturation 15%, ferritin 72 03/26/2023: Hemoglobin 11.4, MCV 88.3, iron  saturation 30%, ferritin 69 08/12/23: Hemoglobin: 11.2, Iron  Sat: 15%, Ferritin: 32 02/07/2024: Hemoglobin 12.8, MCV 87.7, iron  saturation 16%, ferritin 21   IV Iron : Aug 2024   RTC in 6 months with labs and telephone visit after

## 2024-02-09 NOTE — Progress Notes (Signed)
 HEMATOLOGY-ONCOLOGY TELEPHONE VISIT PROGRESS NOTE  I connected with our patient on 02/09/2024 at 10:15 AM EST by telephone and verified that I am speaking with the correct person using two identifiers.  I discussed the limitations, risks, security and privacy concerns of performing an evaluation and management service by telephone and the availability of in person appointments.  I also discussed with the patient that there may be a patient responsible charge related to this service. The patient expressed understanding and agreed to proceed.   History of Present Illness: Telephone follow-up to discuss results of iron  studies  History of Present Illness Miranda Harrington is an 83 year old female with iron  deficiency anemia who presents for routine hematology follow-up and review of laboratory results.  She is asymptomatic with no new symptoms and denies fatigue, dyspnea, or other signs of anemia or bleeding.  Recent labs show hemoglobin 12.8 g/dL, platelet count 804 x 89^0/O, iron  saturation 16%, and ferritin 21 ng/mL. She has not required iron  supplementation for about 18 months.      REVIEW OF SYSTEMS:   Constitutional: Denies fevers, chills or abnormal weight loss All other systems were reviewed with the patient and are negative. Observations/Objective:     Assessment Plan:  Iron  deficiency anemia Lab review: 02/26/2022: Hemoglobin 10.4, MCV 84 08/13/2022: Hemoglobin 9, MCV 80, creatinine 1.02, iron  saturation 5%, TIBC 464 09/12/2022: Hemoglobin 8.6, MCV 78.4, SPEP: Normal, reticulocyte: 1.3%, reticulocyte hemoglobin 20.6, LDH 169, iron  saturation 3%, TIBC 532, ferritin 5, DAT negative, B12: 908, Folate: 45.8, Erythropoietin : 45.8 12/18/2022: Hemoglobin 11.8, MCV 84.8, platelets 166, iron  saturation 15%, ferritin 72 03/26/2023: Hemoglobin 11.4, MCV 88.3, iron  saturation 30%, ferritin 69 08/12/23: Hemoglobin: 11.2, Iron  Sat: 15%, Ferritin: 32 02/07/2024: Hemoglobin 12.8, MCV 87.7, iron   saturation 16%, ferritin 21   IV Iron : Aug 2024   RTC in 6 months with labs and telephone visit after     I discussed the assessment and treatment plan with the patient. The patient was provided an opportunity to ask questions and all were answered. The patient agreed with the plan and demonstrated an understanding of the instructions. The patient was advised to call back or seek an in-person evaluation if the symptoms worsen or if the condition fails to improve as anticipated.   I provided 20 minutes of non-face-to-face time during this encounter.  This includes time for charting and coordination of care   [name], MD

## 2024-03-07 ENCOUNTER — Other Ambulatory Visit: Payer: Self-pay

## 2024-03-07 DIAGNOSIS — D509 Iron deficiency anemia, unspecified: Secondary | ICD-10-CM

## 2024-03-17 ENCOUNTER — Inpatient Hospital Stay: Attending: Hematology and Oncology

## 2024-03-21 ENCOUNTER — Inpatient Hospital Stay: Admitting: Hematology and Oncology

## 2024-08-14 ENCOUNTER — Inpatient Hospital Stay

## 2024-08-16 ENCOUNTER — Inpatient Hospital Stay: Admitting: Hematology and Oncology
